# Patient Record
Sex: Female | Born: 1995 | Race: White | Hispanic: Yes | Marital: Married | Smoking: Never smoker
Health system: Western US, Academic
[De-identification: ages and names within clinical notes are randomized; demographics above are authoritative.]

## PROBLEM LIST (undated history)

## (undated) ENCOUNTER — Ambulatory Visit (HOSPITAL_BASED_OUTPATIENT_CLINIC_OR_DEPARTMENT_OTHER): Admission: RE | Payer: Self-pay | Admitting: Gastroenterology

## (undated) ENCOUNTER — Ambulatory Visit (HOSPITAL_BASED_OUTPATIENT_CLINIC_OR_DEPARTMENT_OTHER): Payer: Self-pay | Admitting: Gastroenterology

## (undated) ENCOUNTER — Encounter (HOSPITAL_BASED_OUTPATIENT_CLINIC_OR_DEPARTMENT_OTHER): Payer: Self-pay

## (undated) DIAGNOSIS — G43909 Migraine, unspecified, not intractable, without status migrainosus: Secondary | ICD-10-CM

## (undated) DIAGNOSIS — R000 Tachycardia, unspecified: Secondary | ICD-10-CM

## (undated) DIAGNOSIS — M4050 Lordosis, unspecified, site unspecified: Secondary | ICD-10-CM

## (undated) DIAGNOSIS — F329 Major depressive disorder, single episode, unspecified: Secondary | ICD-10-CM

## (undated) DIAGNOSIS — F32A Depression, unspecified: Secondary | ICD-10-CM

## (undated) HISTORY — DX: Depression, unspecified: F32.A

## (undated) HISTORY — DX: Tachycardia, unspecified: R00.0

## (undated) HISTORY — DX: Major depressive disorder, single episode, unspecified: F32.9

## (undated) HISTORY — DX: Migraine, unspecified, not intractable, without status migrainosus: G43.909

## (undated) HISTORY — DX: Lordosis, unspecified, site unspecified: M40.50

## (undated) SURGERY — ESOPHAGOGASTRODUODENOSCOPY (EGD) USING BRAVO DEVICE
Anesthesia: Monitored Anesthesia Care (MAC)

## (undated) SURGERY — COLONOSCOPY
Anesthesia: Monitored Anesthesia Care (MAC)

## (undated) MED ORDER — NADOLOL 40 MG OR TABS
40.00 mg | ORAL_TABLET | Freq: Every day | ORAL | 0 refills | Status: AC
Start: 2023-02-05 — End: ?

## (undated) MED ORDER — NADOLOL 40 MG OR TABS
40.0000 mg | ORAL_TABLET | Freq: Every day | ORAL | 1 refills | Status: AC
Start: 2023-01-22 — End: ?

## (undated) MED ORDER — NURTEC 75 MG PO TBDP
ORAL_TABLET | ORAL | 5 refills | Status: AC
Start: 2021-09-30 — End: ?

## (undated) MED ORDER — VENLAFAXINE HCL 150 MG OR CP24
150.0000 mg | ORAL_CAPSULE | Freq: Every day | ORAL | 0 refills | Status: AC
Start: 2023-03-19 — End: ?

## (undated) MED ORDER — MOTEGRITY 2 MG PO TABS
ORAL_TABLET | ORAL | 11 refills | Status: AC
Start: 2021-09-26 — End: ?

## (undated) MED ORDER — VERAPAMIL HCL SR (BID) 180 MG OR TBCR
180.0000 mg | EXTENDED_RELEASE_TABLET | Freq: Every day | ORAL | 0 refills | Status: AC
Start: 2023-03-09 — End: ?

## (undated) MED ORDER — METFORMIN HCL 500 MG OR TB24
500.0000 mg | ORAL_TABLET | Freq: Two times a day (BID) | ORAL | 0 refills | Status: AC
Start: 2023-05-16 — End: 2023-06-15

## (undated) MED ORDER — NADOLOL 40 MG OR TABS
40.0000 mg | ORAL_TABLET | Freq: Every day | ORAL | 0 refills | Status: AC
Start: 2023-03-19 — End: ?

## (undated) MED ORDER — VERAPAMIL HCL SR (BID) 180 MG OR TBCR
180.0000 mg | EXTENDED_RELEASE_TABLET | Freq: Every day | ORAL | 1 refills | Status: AC
Start: 2023-01-22 — End: ?

---
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
## 2014-05-08 ENCOUNTER — Encounter (INDEPENDENT_AMBULATORY_CARE_PROVIDER_SITE_OTHER): Payer: Self-pay | Admitting: Otolaryngology

## 2014-05-08 ENCOUNTER — Ambulatory Visit (INDEPENDENT_AMBULATORY_CARE_PROVIDER_SITE_OTHER): Payer: Medicaid Other | Admitting: Otolaryngology

## 2014-05-08 VITALS — BP 121/67 | HR 108 | Temp 97.2°F

## 2014-05-08 DIAGNOSIS — G43109 Migraine with aura, not intractable, without status migrainosus: Principal | ICD-10-CM

## 2014-05-08 MED ORDER — LEVONORGESTREL-ETHINYL ESTRAD 0.1-20 MG-MCG OR TABS
1.00 | ORAL_TABLET | Freq: Every day | ORAL | Status: DC
Start: ? — End: 2018-03-25

## 2014-05-08 MED ORDER — TERBINAFINE HCL EX
CUTANEOUS | Status: DC
Start: ? — End: 2018-03-25

## 2014-05-08 MED ORDER — VERAPAMIL HCL 40 MG OR TABS
40.0000 mg | ORAL_TABLET | Freq: Two times a day (BID) | ORAL | Status: DC
Start: 2014-05-08 — End: 2014-06-02

## 2014-05-08 MED ORDER — MAGNESIUM 500 MG OR CAPS
500.0000 mg | ORAL_CAPSULE | Freq: Every day | ORAL | Status: DC
Start: 2014-05-08 — End: 2015-04-29

## 2014-05-08 NOTE — Progress Notes (Signed)
Headaches still persistent and has not been able to attempt trial of verapamil    Assess:   Migraine with vertigo    Plan:   Verapamil 40 BID   Magnesium 500 mg

## 2014-06-02 ENCOUNTER — Encounter (HOSPITAL_BASED_OUTPATIENT_CLINIC_OR_DEPARTMENT_OTHER): Payer: Self-pay | Admitting: Otolaryngology

## 2014-06-02 DIAGNOSIS — G43109 Migraine with aura, not intractable, without status migrainosus: Principal | ICD-10-CM

## 2014-06-02 MED ORDER — VERAPAMIL HCL SR (BID) 120 MG OR TBCR
120.0000 mg | EXTENDED_RELEASE_TABLET | Freq: Every day | ORAL | Status: DC
Start: 2014-06-02 — End: 2014-11-26

## 2014-06-02 NOTE — Telephone Encounter (Signed)
From: Gloris ManchesterMichelle Alejandra Hemminger  To: Laurier NancyViirre, Wise Fees Scott, MD  Sent: 06/02/2014 12:57 PM PST  Subject: 1-Non Urgent Medical Advice    Hello I just wanted to let you know that the verpaimil is working a little I think I need a higher does for it to fully work I've been getting less headaches.please message me back or call me as soon as possible thank you!

## 2014-06-03 ENCOUNTER — Encounter (HOSPITAL_BASED_OUTPATIENT_CLINIC_OR_DEPARTMENT_OTHER): Payer: Self-pay | Admitting: Otolaryngology

## 2014-06-07 NOTE — Telephone Encounter (Signed)
From: Kristen ManchesterMichelle Alejandra Olson  To: Laurier NancyViirre, Ellizabeth Dacruz Scott, MD  Sent: 06/03/2014 6:02 AM PST  Subject: 1-Non Urgent Medical Advice    so my pharmacy should already have my order? and whats the dosage thanks so much.  ----- Message -----  From: Laurier NancyErik Scott Dashel Goines, MD  Sent: 06/02/2014 9:29 PM PST  To: Kristen ManchesterMichelle Alejandra Oyen  Subject: RE: 1-Non Urgent Medical Advice    Excellent! I have ordered the next step up which is only once a day.    Kristin BruinsErik Akyra Bouchie    ----- Message -----   From: Kristen ManchesterODRIGUEZ,Tracee ALEJANDRA   Sent: 06/02/2014 12:57 PM PST   To: Laurier NancyErik Scott Deepika Decatur, MD  Subject: 1-Non Urgent Medical Advice    Hello I just wanted to let you know that the verpaimil is working a little I think I need a higher does for it to fully work I've been getting less headaches.please message me back or call me as soon as possible thank you!

## 2014-11-26 ENCOUNTER — Other Ambulatory Visit (INDEPENDENT_AMBULATORY_CARE_PROVIDER_SITE_OTHER): Payer: Self-pay | Admitting: Otolaryngology

## 2014-11-26 DIAGNOSIS — G43109 Migraine with aura, not intractable, without status migrainosus: Principal | ICD-10-CM

## 2014-11-26 MED ORDER — VERAPAMIL HCL SR (BID) 120 MG OR TBCR
120.0000 mg | EXTENDED_RELEASE_TABLET | Freq: Every day | ORAL | 2 refills | Status: DC
Start: 2014-11-26 — End: 2015-05-26

## 2014-11-26 NOTE — Telephone Encounter (Signed)
LOV: 05/08/2014  NOV: none  Tabs: 60  Refills: 2

## 2015-04-29 ENCOUNTER — Other Ambulatory Visit (INDEPENDENT_AMBULATORY_CARE_PROVIDER_SITE_OTHER): Payer: Self-pay | Admitting: Otolaryngology

## 2015-04-29 DIAGNOSIS — G43109 Migraine with aura, not intractable, without status migrainosus: Principal | ICD-10-CM

## 2015-04-29 MED ORDER — MAGNESIUM 500 MG OR CAPS
500.0000 mg | ORAL_CAPSULE | Freq: Every day | ORAL | 3 refills | Status: DC
Start: 2015-04-29 — End: 2016-06-05

## 2015-04-29 NOTE — Telephone Encounter (Signed)
Medication:    Magnesium 500 MG capsule     Pharmacy: [id number] - [place], [place] - [address]     Last seen: 05/08/2014  Next Visit: none    Date Last Dispensed: 04/04/2015  By pharmacy     Refill request pending; will be routed to prescribing physician.

## 2015-05-26 ENCOUNTER — Other Ambulatory Visit (INDEPENDENT_AMBULATORY_CARE_PROVIDER_SITE_OTHER): Payer: Self-pay | Admitting: Otolaryngology

## 2015-05-26 DIAGNOSIS — G43109 Migraine with aura, not intractable, without status migrainosus: Principal | ICD-10-CM

## 2015-05-26 NOTE — Telephone Encounter (Signed)
Medication:    verapamil (CALAN SR) 120 MG     Pharmacy: [facility] - [place], [place] - [address]    Last seen: 05/08/2014  Next Visit: none    Date Last Dispensed: 03/23/2015  By pharmacy     Refill request pending; will be routed to prescribing physician.

## 2015-05-28 MED ORDER — VERAPAMIL HCL SR (BID) 120 MG OR TBCR
120.0000 mg | EXTENDED_RELEASE_TABLET | Freq: Every day | ORAL | 2 refills | Status: DC
Start: 2015-05-28 — End: 2018-03-25

## 2016-06-05 ENCOUNTER — Other Ambulatory Visit (INDEPENDENT_AMBULATORY_CARE_PROVIDER_SITE_OTHER): Payer: Self-pay | Admitting: Otolaryngology

## 2016-06-05 DIAGNOSIS — G43109 Migraine with aura, not intractable, without status migrainosus: Principal | ICD-10-CM

## 2016-06-05 MED ORDER — MAGNESIUM 500 MG OR CAPS
500.0000 mg | ORAL_CAPSULE | Freq: Every day | ORAL | 3 refills | Status: DC
Start: 2016-06-05 — End: 2017-06-19

## 2016-06-05 NOTE — Telephone Encounter (Signed)
Medication:    RA MAGNESIUM 500 MG CAPSULE.  Pharmacy:  [facility] [phone]    Last seen: 05/08/2014  Next Visit: N/A    Date Last Dispensed: 03/31/2016  By pharmacy     Refill request pending; will be routed to prescribing physician.

## 2017-06-19 ENCOUNTER — Other Ambulatory Visit (INDEPENDENT_AMBULATORY_CARE_PROVIDER_SITE_OTHER): Payer: Self-pay | Admitting: Otolaryngology

## 2017-06-19 DIAGNOSIS — G43109 Migraine with aura, not intractable, without status migrainosus: Principal | ICD-10-CM

## 2017-06-19 MED ORDER — RA MAGNESIUM 500 MG OR CAPS
ORAL_CAPSULE | ORAL | 3 refills | Status: DC
Start: 2017-06-19 — End: 2018-03-25

## 2017-06-19 NOTE — Telephone Encounter (Signed)
Medication:Magnesium 500 MG capsule (Order# [id number])           [facility]    Last seen:05/08/2014  Next Visit:none        Refill request pending; will be routed to prescribing physician.

## 2017-09-26 HISTORY — PX: CARDIAC DEFIBRILLATOR PLACEMENT: SHX171

## 2018-03-25 ENCOUNTER — Encounter (INDEPENDENT_AMBULATORY_CARE_PROVIDER_SITE_OTHER): Payer: Self-pay | Admitting: Nurse Practitioner

## 2018-03-25 ENCOUNTER — Ambulatory Visit (INDEPENDENT_AMBULATORY_CARE_PROVIDER_SITE_OTHER): Payer: Medicaid Other | Admitting: Nurse Practitioner

## 2018-03-25 VITALS — BP 100/68 | HR 58 | Temp 98.4°F | Ht 61.0 in | Wt 163.0 lb

## 2018-03-25 DIAGNOSIS — Z3041 Encounter for surveillance of contraceptive pills: Secondary | ICD-10-CM

## 2018-03-25 DIAGNOSIS — Z01419 Encounter for gynecological examination (general) (routine) without abnormal findings: Secondary | ICD-10-CM

## 2018-03-25 DIAGNOSIS — N94819 Vulvodynia, unspecified: Secondary | ICD-10-CM

## 2018-03-25 MED ORDER — LEVONORGEST-ETH ESTRAD 91-DAY 0.15-0.03 &0.01 MG OR TABS
1.00 | ORAL_TABLET | Freq: Every day | ORAL | Status: DC
Start: ? — End: 2018-03-25

## 2018-03-25 MED ORDER — TRULANCE 3 MG PO TABS
3.00 mg | ORAL_TABLET | Freq: Every day | ORAL | 0 refills | Status: DC
Start: 2017-12-26 — End: 2021-06-28

## 2018-03-25 MED ORDER — VERAPAMIL HCL SR (BID) 180 MG OR TBCR
EXTENDED_RELEASE_TABLET | ORAL | 0 refills | Status: DC
Start: 2018-02-14 — End: 2022-07-23

## 2018-03-25 MED ORDER — OMEPRAZOLE 20 MG OR CPDR
DELAYED_RELEASE_CAPSULE | ORAL | 0 refills | Status: DC
Start: 2018-02-22 — End: 2023-01-05

## 2018-03-25 MED ORDER — LEVONORGEST-ETH ESTRAD 91-DAY 0.15-0.03 &0.01 MG OR TABS
1.0000 | ORAL_TABLET | Freq: Every day | ORAL | 3 refills | Status: DC
Start: 2018-03-25 — End: 2019-04-08

## 2018-03-25 MED ORDER — ESCITALOPRAM OXALATE 10 MG OR TABS
ORAL_TABLET | ORAL | 0 refills | Status: DC
Start: 2018-03-08 — End: 2022-07-23

## 2018-03-25 MED ORDER — NADOLOL 20 MG OR TABS
ORAL_TABLET | ORAL | 0 refills | Status: DC
Start: 2018-02-14 — End: 2022-07-23

## 2018-03-25 MED ORDER — ISOSORBIDE DINITRATE 30 MG OR TABS: 30.00 mg | ORAL_TABLET | Freq: Three times a day (TID) | ORAL | Status: AC

## 2018-03-25 NOTE — Progress Notes (Signed)
[facility]    SUBJECTIVE  Kristen Olson is a 22 year old female G0P0000 who comes to the clinic for a routine well-woman evaluation.    Satisfied w extended cycle OCP [facility] generic).  Menses q 3 mos on extended cycle OCP.  1 partner x 7 yrs.  Declines STD testing.    Pt reports recent pap neg w/ [facility]/Dr. Andi Hence. 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 Pt reports long h/o painful intercourse and general discomfort of vulva/vagina. She has tried estrogen cream x 3 mos. Uses it w/ intercourse and prn. States that it hasn't helped and "makes her feel weird down there."    Allergies:    Allergies   Allergen Reactions   . Gluten Meal Anaphylaxis       Medications:   Current Outpatient Medications:   .  escitalopram (LEXAPRO) 10 MG tablet, , Disp: , Rfl: 0  .  isosorbide dinitrate (ISORDIL) 30 MG tablet, Take 30 mg by mouth every 8 hours., Disp: , Rfl:   .  levonorgestrel-ethinyl estradiol (SEASONIQUE) 0.15-0.03 &0.01 MG tablet, Take 1 tablet by mouth daily., Disp: 91 tablet, Rfl: 3  .  nadolol (CORGARD) 20 MG tablet, , Disp: , Rfl: 0  .  omeprazole (PRILOSEC) 20 MG capsule, , Disp: , Rfl: 0  .  TRULANCE 3 MG TABS tablet, Take 3 mg by mouth daily., Disp: , Rfl: 0  .  verapamil (CALAN SR) 180 MG Controlled-Release tablet, , Disp: , Rfl: 0    Menstrual history:  LMP approx 12/27/17  History of abnormal vaginal bleeding:  no  Contraception:  oral contraceptives    Gyn History            LMP 12/27/2017 (Within Months), Oral contraception    Age at Menarche     Age at First Pregnancy     Age at Menopause     Gyn History Comments 01/2015 pap NL per pt.No h/o std. BC: OCP.H/o Nexplanon - removed due to wt gain.    Sexual Activity Yes; Female    Contraception Pill    Medical History Tachycardia    Spinal lordosis    Migraine    Depression        Surgical History CARDIAC DEFIBRILLATOR PLACEMENT 09/2017             Family History   Problem Relation Name Age of Onset   . Cholesterol/Lipid  Disorder Mother     . Heart Disease Mother              Review of systems:    A complete review of systems was performed and is negative except as documented in HPI and here: + weight gain, sinus congestion, chest pain, irreg heart beat (sees cardiologist), constipation, migraine, and depression.    OBJECTIVE  BP 100/68 (BP Location: Left arm, BP Patient Position: Sitting, BP cuff size: Regular)   Pulse 58   Temp 98.4 F (36.9 C)   Ht 5\' 1"  (1.549 m)   Wt 73.9 kg (163 lb)   LMP 12/27/2017 (Within Months)   SpO2 98%   Breastfeeding? No   BMI 30.80 kg/m       Physical exam:     HEENT:No thyromegaly, no lymphadenopathy     Cardiovascular:regular rate and rhythm     Chest:clear to auscultation bilaterally     Breast:breasts symmetric, no dominant or suspicious mass, no skin or nipple changes and no axillary adenopathy     Abdomen:negative  Pelvic exam:        EGBUS: Normal female, no lesions        Vagina:Normal mucosa, no discharge        Cervix:No lesions or cervical motion tenderness        Uterus: Normal shape, position and consistency. Exam limited by body habitus and pt tense, guarding.        Adnexa: Not felt. No masses, nodularity, tenderness        RVE: Not done    Laboratory review:  Last pap: 12/2017 NL per pt (w/ [facility]/Dr. Andi Hence)      ASSESSMENT/PLAN  Kristen Olson was seen today for annual exam.    Diagnoses and all orders for this visit:    Well woman exam with routine gynecological exam    Surveillance for birth control, oral contraceptives  -     levonorgestrel-ethinyl estradiol (SEASONIQUE) 0.15-0.03 &0.01 MG tablet; Take 1 tablet by mouth daily.    Vulvodynia  -     [facility]    Advised pt to try using estrogen vaginal cream (Rx from Dr. Andi Hence) 2 times per week instead of just as needed.    Defer Pap. Next due 2022.  Pt declines std testing.

## 2018-07-22 ENCOUNTER — Telehealth (INDEPENDENT_AMBULATORY_CARE_PROVIDER_SITE_OTHER): Payer: Self-pay

## 2018-07-22 NOTE — Telephone Encounter (Signed)
Pt change insurance and the insurance no longer covers escitalopram (LEXAPRO) 10 MG tablet. Wants to know if we can send a auth. WWE 02/2018.  [facility]    [address], [place], [place] [address]  No known allergies per pt.

## 2018-07-23 NOTE — Telephone Encounter (Addendum)
TC from pt: request was for OCP, not lexapro. Pharmacy told her order was "accidentally DC'd" and then told pt that also not covered under new insurance. Pt is already past due to start new pack. Pt is frustrated and confused. Ok to leave detailed message if needed.    TC to pharmacy: spoke with [facility]. Accidental DC of Rx was already corrected. Only issue is that new insurance requires PA. To initiate and fax for RN to complete. Pt may want to consider changing BC    TC to pt: notified that PA will be completed once received. Pt does not want to trial another BC, states she failed every other OCP tried, has heavy periods and severe cramping on every OCP she tried except seasonique.

## 2018-07-26 NOTE — Telephone Encounter (Signed)
PA received from pharmacy. Completed by RN and faxed with chart notes to [facility].

## 2018-07-30 NOTE — Telephone Encounter (Signed)
Additional form rcv'd from pharmacy. Completed, faxed to [facility]. Given to [place], admin staff, to scan into media.

## 2018-09-25 ENCOUNTER — Other Ambulatory Visit (INDEPENDENT_AMBULATORY_CARE_PROVIDER_SITE_OTHER): Payer: Self-pay | Admitting: Otolaryngology

## 2018-09-25 DIAGNOSIS — G43109 Migraine with aura, not intractable, without status migrainosus: Principal | ICD-10-CM

## 2018-09-25 NOTE — Telephone Encounter (Signed)
Prescription Refill Request     Incoming fax refill request from pharmacy     Prescribing provider: Dr. Wynona Luna    Requested Medication/s: Magnesium 500MG  Cap      Send to:   [facility] [place], [place] - [address]  [address]  [address]  [place] [address]  Phone: [phone] Fax: [phone]

## 2018-09-26 MED ORDER — MAGNESIUM OXIDE 500 MG OR CAPS
ORAL_CAPSULE | ORAL | 11 refills | Status: DC
Start: 2018-09-26 — End: 2019-10-07

## 2019-04-08 ENCOUNTER — Other Ambulatory Visit (INDEPENDENT_AMBULATORY_CARE_PROVIDER_SITE_OTHER): Payer: Self-pay | Admitting: Nurse Practitioner

## 2019-04-08 DIAGNOSIS — Z3041 Encounter for surveillance of contraceptive pills: Secondary | ICD-10-CM

## 2019-04-08 MED ORDER — LEVONORGEST-ETH ESTRAD 91-DAY 0.15-0.03 &0.01 MG OR TABS
1.00 | ORAL_TABLET | Freq: Every day | ORAL | 0 refills | Status: DC
Start: 2019-04-08 — End: 2019-07-18

## 2019-04-08 NOTE — Telephone Encounter (Signed)
Refill request [facility]   Due for [facility] 02/2019: Needs appointment: please call patient

## 2019-04-08 NOTE — Telephone Encounter (Signed)
OCP refill ordered.

## 2019-04-10 NOTE — Telephone Encounter (Signed)
Pt scheduled 05/07/19

## 2019-05-07 ENCOUNTER — Ambulatory Visit (INDEPENDENT_AMBULATORY_CARE_PROVIDER_SITE_OTHER): Payer: Medicaid Other | Admitting: Nurse Practitioner

## 2019-05-07 ENCOUNTER — Telehealth (HOSPITAL_BASED_OUTPATIENT_CLINIC_OR_DEPARTMENT_OTHER): Payer: Self-pay

## 2019-05-07 NOTE — Telephone Encounter (Signed)
Patient is scheduled for in-clinic appointment.    Appointment scheduled for     Do you have or have you had in the last 24 hours:   ·         Fever:  N  ·         New cough (not chronic) : N  ·         Shortness of breath: N  *Refer patient to PCP if YES for any flu like symptoms           2. Have you knowingly been exposed to anyone having any, some or all of the symptoms listed above?  N     3. Have you traveled  outside of the [place] in the last 14 days? .  N     If Yes to any of these, schedule and or reschedule existing appointment 14 + days out     4.    Are you experiencing any new urgent Neurological symptoms?   If Yes to # 4, provide symptoms below using .sccsymptoms smart phrase and route to site specific triage.    If no to # 4, document encounter and close.  Do not route.    Did you inform patient of no visitor policy Y    Will the patient require a visitor? N    If Yes: document visitor's full name in appointment notes.

## 2019-06-12 ENCOUNTER — Encounter: Payer: Self-pay | Admitting: Hospital

## 2019-07-18 ENCOUNTER — Other Ambulatory Visit (INDEPENDENT_AMBULATORY_CARE_PROVIDER_SITE_OTHER): Payer: Self-pay | Admitting: Nurse Practitioner

## 2019-07-18 DIAGNOSIS — Z3041 Encounter for surveillance of contraceptive pills: Secondary | ICD-10-CM

## 2019-07-18 MED ORDER — LEVONORGEST-ETH ESTRAD 91-DAY 0.15-0.03 &0.01 MG OR TABS
1.0000 | ORAL_TABLET | Freq: Every day | ORAL | 0 refills | Status: DC
Start: 2019-07-18 — End: 2021-09-02

## 2019-07-18 NOTE — Telephone Encounter (Signed)
*  received fax from pharmacy requesting refill     Medication name and dose: levonorgestrel-ethinyl estradiol (SEASONIQUE) 0.15-0.03 &0.01 MG tablet     Provider who gave the medication.: Ordering: Leslie Andrea, NP    Date last seen by provider: 03/25/2018    Date of next appointment: None scheduled     Name of pharmacy and pharmacy phone number.  [facility] - [place], [place] - [address]  Phone: [phone]

## 2019-07-18 NOTE — Telephone Encounter (Signed)
OCP refill ordered.

## 2019-07-21 NOTE — Telephone Encounter (Signed)
Left voice mail to schedule appt

## 2019-08-27 ENCOUNTER — Encounter (INDEPENDENT_AMBULATORY_CARE_PROVIDER_SITE_OTHER): Payer: Self-pay

## 2019-09-06 ENCOUNTER — Ambulatory Visit (INDEPENDENT_AMBULATORY_CARE_PROVIDER_SITE_OTHER): Payer: Medicaid Other

## 2019-09-06 DIAGNOSIS — Z23 Encounter for immunization: Secondary | ICD-10-CM

## 2019-09-06 MED ORDER — COVID-19 MRNA VACC (MODERNA) 100 MCG/0.5ML IM SUSP (ROVER)
0.5000 mL | Freq: Once | INTRAMUSCULAR | Status: AC
Start: 2019-09-06 — End: 2019-09-06
  Administered 2019-09-06 (×2): 0.5 mL via INTRAMUSCULAR

## 2019-10-04 ENCOUNTER — Ambulatory Visit (INDEPENDENT_AMBULATORY_CARE_PROVIDER_SITE_OTHER): Payer: Self-pay

## 2019-10-04 ENCOUNTER — Ambulatory Visit (INDEPENDENT_AMBULATORY_CARE_PROVIDER_SITE_OTHER): Payer: Medicaid Other

## 2019-10-04 DIAGNOSIS — Z23 Encounter for immunization: Secondary | ICD-10-CM

## 2019-10-04 MED ORDER — COVID-19 MRNA VACC (MODERNA) 100 MCG/0.5ML IM SUSP (ROVER)
0.5000 mL | Freq: Once | INTRAMUSCULAR | Status: AC
Start: 2019-10-04 — End: 2019-10-04
  Administered 2019-10-04 (×2): 0.5 mL via INTRAMUSCULAR

## 2019-10-06 ENCOUNTER — Other Ambulatory Visit (INDEPENDENT_AMBULATORY_CARE_PROVIDER_SITE_OTHER): Payer: Self-pay | Admitting: Otolaryngology

## 2019-10-06 DIAGNOSIS — G43109 Migraine with aura, not intractable, without status migrainosus: Secondary | ICD-10-CM

## 2019-10-07 ENCOUNTER — Telehealth (INDEPENDENT_AMBULATORY_CARE_PROVIDER_SITE_OTHER): Payer: Self-pay | Admitting: Nurse Practitioner

## 2019-10-07 MED ORDER — MAGNESIUM OXIDE 500 MG OR CAPS
ORAL_CAPSULE | ORAL | 11 refills | Status: DC
Start: 2019-10-07 — End: 2020-09-13

## 2019-10-07 NOTE — Telephone Encounter (Signed)
Patient is requesting refill of medication, please see orders for preloaded prescription refill of  Magnesium Oxide 500 MG CAPS .    Last visit with this physician: 05/08/2014     Next appointment with this physician: None     Request has been routed to provider : Dr. Wynona Luna

## 2019-10-07 NOTE — Telephone Encounter (Signed)
Provider Name:NP Riego    Last Visit Date: 03/25/2018    Next office visit date: NA    Medications:levonorgestrel-ethinyl estradiol (SEASONIQUE) 0.15-0.03 &0.01 MG tablet        Name of Pharmacy Updated in Demographics (YES)          [facility] - [place], [place] - [address]  [address]  [place] [address]  Phone: [phone] Fax: [phone]

## 2019-10-08 NOTE — Telephone Encounter (Signed)
Pt needs referral from PCP office. Pt woring on obtaining that.

## 2020-09-13 ENCOUNTER — Other Ambulatory Visit (INDEPENDENT_AMBULATORY_CARE_PROVIDER_SITE_OTHER): Payer: Self-pay | Admitting: Neurology

## 2020-09-13 DIAGNOSIS — G43109 Migraine with aura, not intractable, without status migrainosus: Secondary | ICD-10-CM

## 2020-09-13 MED ORDER — MAGNESIUM OXIDE 500 MG OR CAPS
ORAL_CAPSULE | ORAL | 11 refills | Status: DC
Start: 2020-09-13 — End: 2021-10-07

## 2020-09-13 NOTE — Telephone Encounter (Signed)
Patient is requesting refill of medication, please see orders for preloaded prescription refill of   Magnesium Oxide 500 MG CAPS  Last visit with this physician:   05/08/14  Next appointment with this physician:   NONE  Request has been routed to provider  DR. Wynona Luna

## 2020-10-12 ENCOUNTER — Telehealth: Admitting: Neurology

## 2020-10-12 MED ORDER — RIZATRIPTAN BENZOATE 10 MG OR TABS
10.0000 mg | ORAL_TABLET | Freq: Once | ORAL | 11 refills | Status: DC | PRN
Start: 2020-10-12 — End: 2021-01-26

## 2020-10-12 NOTE — Patient Instructions (Signed)
BOTOX  FOR MIGRAINE    Migraine can be subdivided into chronic and episodic types depending on the number of headache days each month.  If there are more than 15 headache days a month with at least 8 moderate to severe headache days this is termed Chronic Migraine.  Simply stated, this diagnosis refers to individuals who have migraines and overall headache more days than not. If there are less than 15 headache days a month this is termed Episodic Migraine. BOTOX is FDA approved only for Chronic Migraine. This medication was originally used to treat crossed eyes and later for conditions such as blepharospasm (eye muscle twitching and spasms) and torticollis (twisting necks).     Headaches are extremely common in the [place] with an estimate of 45 million people affected such that they miss days from work and school or have chronic disability.    BOTOX (OnaBotulinum Toxin) for Headache    BOTOX is a therapeutic option for the preventive treatment of migraine. It offers several advantages over current drug therapy. Injections are given in doses up to 195 units every 12 weeks. When effective, the need for daily medications or acute medicines for severe attacks can be reduced. Over 10,000 patient injections have been performed between Drs. Blumenfeld and Arling Cerone in more than 3,000 patients with approximately 70% of patients having a good response and continuing sequential treatments.      Mechanism of Action    BOTOX can reduce muscle spasms. However, the reduction in pain is significant and can last longer. It is likely that small fibers containing pain-producing chemicals such as Substance P, glutamate and CGRP are affected by Botulinum toxins. Other mechanisms, not yet understood, may be at play. None of these theories are yet proven.1        History    Botox was 1st developed in the 1980s for treatment of facial spasms, including blepharospasm. In the early 1990's, Dr. Fuller Plan, an Otolaryngologist who  used Botulinum toxin for cosmetic purposes in the muscles around the forehead noted that a number of patients reported improvements in migraine headache. The number of physicians using BOTOX for headache has gradually increased. Dr. Estill Batten worked closely with Dr. Lajoyce Corners to develop the current injection technique for Chronic Migraine.1 Drs. Blumenfeld and Yittel Emrich also work closely with [employer], the company that manufactures BOTOX, in studies of onobotulinumtoxnA in headache disorders.2-6 Both Doctors are active in training other physicians around the world to inject onobotulinumtoxin A for Chronic Migraine.      Types of Injections    There are usually a total of 31+ injections over 7 muscle regions.  Muscles around the eyes and forehead are injected with small amounts of Botulinum toxin.  Injections are also made into the temporal region, into the upper back of the neck and going down onto the shoulders.  Dots indicate typical injection locations.      The usual injection sites and doses are:   Procerus 5 units  Corrugators 5 units each  Frontalis 10 units each  Temporalis 20 units each  Occipitalis 15 units each  Trapezuis 15 units each  Cervical paraspinals 10 units each    Timing of Injection and Duration of Effect    After the patient receives a Botulinum toxin injection, there may be an immediate effect just from the use of needles into muscles. In a [place] study, it was found that dry needle injections into muscles such as trigger-point areas in fibromyalgia significantly reduced pain for up to  a week's time. This may be the basis of the initial effect sometimes seen in migraine patients. Usually patients do not begin to experience relief from their headache for two to four weeks. The beneficial effect gradually increases and has a duration of three months.  When a second injection is performed, the effect appears to take hold sooner, last longer, and have a greater benefit.      Efficacy    The  PREEMPT Trial was designed to assess and prove the efficiency and safety of BOTOX.   Subjects were randomized and subjected to either BOTOX injections or placebo injections. The study compared the baseline number of headache days per 28 days prior to the injections to the number of headaches in weeks 20-24 post-treatment. The baseline frequency of headaches in this study was 19-20 days per 28 days. The frequency of headache days per 28 days decreased by 8-9 days relative to baseline in subjects who received BOTOX. Overall, the study demonstrated the effectiveness of BOTOX in the treatment of headaches in patients with chronic migraine, and that repeated treatments were safe and well tolerated.      BOTOX  Side Effects    Most patients undergoing BOTOX experience a much lower degree of side effects than those that they experience from their current oral medications. Furthermore, because BOTOX is a local injection, the side effects are typically minimal and localized, in comparison to the systemic side effects that can result from the oral medications. On occasion people have pain, soreness, swelling, redness, local weakness or droopiness of the eyelid. About 5% of patients may develop a temporary headache, and 9% may develop temporary neck pain after injections.7 However, most patients tend to not have any adverse reactions to the treatment.       Botox Eligibility    BOTOX is the only FDA approved preventive for chronic migraine. However, it is not usually covered as a 1st line treatment by insurance. Eligibility criteria for BOTOX vary. However, the minimal requirements are the following: patients have been diagnosed with Chronic Migraine, consisting of over 15 headaches per month, each lasting four hours or longer untreated. They have tried and failed 2-3 preventive medications such as anti-depressant, anti-seizure, and anti-hypertensive medications. The provider will select which medications are most appropriate  for the patient, as well as take into account any contraindications or allergies.    Summary    BOTOX has been used in [facility] for decades. Drs. Blumenfeld and Cherisa Brucker have been Multimedia programmer in this field of medicine. It is quite effective in many patients who are not responding to standard migraine preventive therapy. Our estimate of effect in this population is 70%. In many instances this is a less expensive alternative than chronic daily therapy with frequent use of triptan medication. Nonetheless, BOTOX does not work in every patient and alternative therapies must be kept in mind.  ~~~~~~~~~~~~    CGRP and Migraine    Researchers continue to uncover intriguing details about migraine. There are a number of brain chemicals that have been investigated in the complex mechanism of migraine, some of which include calcitonin-gene-related peptide (CGRP), oxytocin, serotonin, substance P, and vasoactive intestinal peptide.  These are small peptides that act as a neurotransmitter (chemical messenger) in the brain.    A body of migraine research over the last few decades suggests that the release of CGRP from the trigeminal nerves in the brain may play a key role in the chemical basis for migraine attacks. Understanding the  role of CGRP in migraine can help have a better understanding of the disease. Research linking CGRP and migraine shows:    1) CGRP levels have been shown to be elevated during a migraine attack and return to normal as pain resolves.  2) CGRP is released after nerve activation  3) Production of CGRP contributes to inflammation and blood vessel dilation in the brain  4) CGRP is involved in the transmission of pain signals  5) Injection of CGRP can induce a migraine attack in those who are susceptible to migraine headaches  6) Medications that block CGRP can relieve or prevent migraines.    People with migraine new options for treatment now, and these new medications aren't just about treating the  pain after it's begun - they prevent migraines from getting started. While there are other medications used to prevent migraines, the currently available medications were all originally developed for other uses, and can have significant side effects.    The new treatment options target CGRP. Researchers have created monoclonal antibodies that target and block either CGRP or the receptors it binds to. Blocking the receptor is analogous to putting chewing gum into a lock - the key does not fit. Blocking CGRP is like putting gum on the key - it still does not fit.    Several pharmaceutical companies have developed drugs that block the effects of CGRP. Three of these drugs  - Aimovig, Emgality, Ajovy are injected subcutaneously (under the skin) and the other - Vyepti, is injected intravenously (into a vein).    In studies, all of these medications have been beneficial as migraine preventives. They start working quickly, and have been well tolerated. As antibody medications stay in the body for a month or more, people can treat once a month or even less often, as migraine prevention. They should not be used in women who are going to get pregnant in the next 6 months as we have no safety data during pregnancy or breast feeding.  In general, they are very well tolerated.    There are also oral medications that block the CGRP receptor, used for acute (abortive, rescue) treatment - rimegepant, ubrogepant.      Scan this code with your smart phone for more information!     ~~~~~~~~  Things You Can Do without a Prescription to help your Migraine Headaches      Food triggers -Most important trigger is any food/drink, which contains artificial  sweeteners, like DIET drinks.  MSG (monosodium glutamate - often in [place] food and salad dressing), aspartame (NutraSweet), red wine, processed foods containing nitrites, are common migraine triggers. Although these are the most common triggers, but each person may find individual  trigger food. So basically, if any food /drink gives you a headache, don't eat/drink it!  Eat regular, small, healthy meals.    Get regular light exercise - studies show that regular cardiovascular exercise (5 days a week, 30 minutes a day) does an excellent job at preventing headaches sometimes even better than medications. Avoid overexertion & start gradually - even a daily walk helps.  Work on daily relaxation, deep breathing & good posture also. Yoga and meditation also have been shown to be very effective.     Drink plenty of fluids - Best fluid is plain water, avoid flavored water. About one-half to one gallon of water a day - this helps prevent headaches, and helps back and joint pain as well.  If you have a headache, drinking a big  glass of water can help it get better.     Get regular sleep - sleep deprivation can contribute to chronic headaches. On the other hand oversleeping can trigger the headache. Patient with chronic headache should have regular sleep schedule If you snore, talk to your doctor - Obstructive Sleep Apnea is a common cause of daily headaches.    Caffeine -Although small amount of caffeine won't make the headache worse and even caffeine may help a headache if used infrequently, but studies show that regular caffeine use results in an increased number of headaches for many people. In general excessive use or fluctuation in caffeine intake is a clear triggering factor for headache, this includes coffee, tea, caffeinated pop. Don't forget caffeine containing medicines like Excedrin or Fioricet has A LOT of caffeine.    Smoking- Smoking is a common headache trigger & cigarette smoke contains  chemicals that can make pain worse.  Besides headaches, smoking causes cancer, strokes & heart disease.  It makes arthritis and low back pain worse.    Smell- Strong perfumes / chemical smells can trigger migraines - beware of chemically scented air fresheners and candles.    Don't overuse your pain  medicines - if you are using ANY "over-the-counter" pain medicine more than 3 days in a row or 15 days a month, or any prescription medication more than 9 days per month, there is a chance it could be causing rebound headaches. This is particularly true for narcotic drugs (like Tramadol, Vicodin, Percocet , Darvocet, Oxycontin,.)and barbiturate-caffeine containing medication (Fioricet, Fiorinal, Esgic,.).   ~~~~~~~    Headache Prevention With Complementary and Alternative Medicine    In patient-centered approaches the best of conventional medicine is used with complementary and alternative medicine (CAM) to combine the best of both for patient benefit. CAM methods, used in this way, are becoming more commonplace in headache management. CAM may be indicated if you simply prefer it, have low tolerance of or poor response to conventional drugs, or have a medical contraindication to certain drugs.     If you use herbal therapy, you are one of the 20% of Americans who do so for a health condition and/or health promotion. But more than half do not disclose their use. However, reporting is necessary, as 72% of those using medicinal herbs also use prescription drugs, and 84% also use over-the-counter medications. If you use herbal preparations, you are more likely to be between 20 and 62 years of age, uninsured or underinsured, female, have higher education levels, live in the [place] or self-identify as "non-Hispanic, other." Roughly two-thirds of Americans using medicinal herbs do not do so for proven uses.     There are 5 commonly utilized CAM medications for headache prevention. While considering preventive CAM therapy realize that this should be just a component of your overall program aimed at reducing trigger factors, implementing behavioral headache management and taking medications acceptable to you and your practitioner. The goal is to find effective means to reduce, by at least 50%, the number of headaches, their  intensity or their length, while improving function and quality of life.   The American Migraine Prevalence and Prevention study suggests considering drug prevention with only 4 days per month of headache and no functional limitations or for 2 days per month with severe impairment. While nearly 40% of headache sufferers meet criteria for prevention just over 10% receive any.     If prevention is warranted, discuss your motivations, preferences, and concerns with your providers. Be  aware that CAM headache therapies are not as rigorously proven as many conventional preventatives and not as strictly regulated by the Food and Drug Administration of the [place] as are prescription therapies and devices. They are classified as dietary supplements and not drugs; and therefore no proof of safe manufacturing, effectiveness, or safety is required for marketing in the [place]. You should have concern for the lack of industry standardization as regards the contents and purity of herbals, along with batch-to-batch consistency. Finally, one should recall that the total number of subjects involved in studies evaluating "herbals" for headache treatment typically is very small. Thus, the admonition: "consumers beware."     FEVERFEW  Feverfew (Tanacetum parthenium) is a species in the chrysanthemum family, whose dried leaves have long been used as a headache remedy. Available feverfew preparations have a >400% variation in dosage strength of the active ingredient parthenolide. Because feverfew also contains melatonin and other compounds, uncertainty exists regarding the major active ingredient. Currently, there are no commercially available standardized feverfew products; therefore, recommendations about dosing are not possible. Feverfew adverse events include sore mouth and tongue (including oral ulcers), swollen lips, loss of taste, abdominal pain, and gastrointestinal disturbances. Adequate long-term studies are available on some  preparations.     PETADOLEX   Petasites hybridus, known as butterbur, is a perennial shrub which grows wild on [place]. The butterbur root is supposed to have antimigraine properties. Some plant parts may cause cancer, liver, lung, and bleeding disorders. The [employer] (Commission E) certifies brand name Petadolex as nontoxic. Two adequate trials support effectiveness with 150 mg per day but not 100 mg. Adverse events compare favorably with placebo except for excess burping. Long-term safety data are limited. The average duration of use has been approximately 3 months with 450,000 users assessed short term with >75,000 patient years of exposure as of 2003.     MAGNESIUM   Low brain magnesium levels in migraineurs are reported in several studies. Magnesium prevention trials have yielded mixed results. It appears that prolonged "high dose" supplementation of a minimum of 400-600 mg per day may be required to achieve any benefit from therapy. No comparison of different magnesium formulations exists to inform decisions. Adverse events are mainly gastrointestinal (diarrhea predominating). Amino acid chelates may be better tolerated, but are lower strength and therefore cost is slightly higher. There is no evidence of any short- or long-term safety issues if serious kidney disease is absent. 967     RIBOFLAVIN   Riboflavin is a water-soluble essential vitamin (B2). There is no proof of best dosage or definite proof of efficacy. Doses of 25 mg and 400 mg have had benefit. Adverse events are limited to diarrhea and frequent urination of fluorescent yellow urine. No known long-term toxicity is known.     COENZYMEQ10   CoQ10, like riboflavin, plays a role in cell energy production. The preparation used in the only rigorous study is not commercially available. The dose of 300 mg was divided into 100 mg 3 times per day. Adverse events were not different between groups. CoQ10 is well-tolerated and  long-term safety is acceptable.     MELATONIN   There are many reasons melatonin should be beneficial in headache, but no proof exists presently. It has been shown to be useful for insomnia.     VITAMIN D3   While not evidence based, vitamin D deficiency/insufficiency is common and harmful. Consider optimizing to a blood level of between 50-80, which may require 1000 IU per  every 25-30 pounds. Such dosing requires a 67-month blood level for 25(OH)D and calcium. Visit [email or web address] and discuss with your practitioner.     OTHER CAM THERAPIES   In a survey of CAM organizations aromatherapy, Bowen technique, chiropractic, hypnotherapy, massage, nutrition, reflexology, Reiki, and yoga were all recommended for headache. Little to no proof of benefit exists on these approaches. Fish oil research is currently negative. 5-HTP may not affect headache. Phytoestrogens may be promising.     SUMMARY   As none of the herbal treatments above have been tested in pregnancy, it is best for pregnant women to avoid all but magnesium. No firm consensus exists as to the relative effectiveness CAM agents, but given the number of patients studied and data available, I tentatively suggest the rank order may be: Petadolex _ Magnesium > Feverfew (if MIG-99 forms become commercially available) > Riboflavin > CoenzymeQ10 >> Melatonin.     Norris Cross. Ladona Ridgel, MD Adjunct Professor of Neurology, [facility] Director, [facility] and [place], [place], [place], [place]

## 2020-10-12 NOTE — Progress Notes (Signed)
 Patient Verification & Telemedicine Consent:    I am proceeding with this evaluation at the direct request of the patient.  I have verified this is the correct patient and have obtained verbal consent and written consent from the patient/ surrogate to perform this voluntary telemedicine evaluation (including obtaining history, performing examination and reviewing data provided by the patient).   The patient/surrogate has the right to refuse this evaluation.  I have explained risks (including potential loss of confidentiality), benefits, alternatives, and the potential need for subsequent face to face care.     Patient/surrogate understands that there is a risk of medical inaccuracies given that our recommendations will be made based on reported data (and we must therefore assume this information is accurate).  Knowing that there is a risk that this information is not reported accurately, and that the telemedicine video, audio, or data feed may be incomplete, the patient agrees to proceed with evaluation and holds [place] harmless knowing these risks.     All laws concerning confidentiality and patient access to medical records and copies of medical records apply to telemedicine.      The patient/surrogate has received [facility] Notice of Privacy Practices.  I have reviewed this above verification and consent paragraph with the patient/surrogate.  If the patient is not capacitated to understand the above, and no surrogate is available, since this is not an emergency evaluation, the visit will be rescheduled until such time that the patient can consent, or the surrogate is available to consent.     Intra-State Location: The patient/ surrogate attests to understanding that if the patient accesses these services from a location outside of [place], that the patient does so at the patient's own risk and initiative and that the patient is ultimately responsible for compliance with any laws or regulations  associated with the patient's use.    Demographics:  Medical Record #: [id number]   Date: Oct 12, 2020   Patient Name: Kristen Olson   DOB: Nov 29, 1995  Age: 25 year old  Sex: female  Location: Home address on file      Evaluator(s):   Kristen Olson was evaluated by me today.    Clinic Location:  [facility] [place]  [facility] [place]  [address], [address]  [place] [address]    HEADACHE MEDICINE SPECIALTY CONSULTATION    REFERRED BY:  Dr Juel Burrow     CHIEF COMPLAINT:   Chief Complaint   Patient presents with   . Migraine       HISTORY:  This 25 year old female is seen in Neurology consultation on 10/12/20    [place].  This is a initial visit for this patient with chief complaint of "chronic complex migraines".  She states that she has had episodes of temporary facial and leg paralysis and dizziness as well as partial visual impairment, dating back to middle school.  She takes medications as needed, but "still gets them".  Initially they were only occasional but at this point she estimates at least 10 moderate to severe days of headache per month, lasting 3-5 hours at a time.  She acknowledges other days of milder headache and reports that she is completely headache free at best 10 or 15 days per month.    Pain is described as bilateral, a bit more on the right, and primarily posterior at least initially.  She notes loss of vision and dizziness, numbness and tingling, nausea, and throbbing headache which worsens  with activity.  Triggers have included stress, fatigue, poor sleep, and MSG.  She has occasional nighttime headaches, and also notes history of headache problems in both her parents.  Years ago she had MRI and spinal tap, but has not had any recent testing.  She has been using verapamil both for arrhythmia and for headaches, and she has previously used Effexor, Prozac, Elavil, Topamax, and Neurontin.  On an abortive basis she has used several nonsteroidals as  well as Imitrex and Zomig, and occasionally, opiates.  She's also tried acupuncture, and magnesium.  She has a Clinical biochemist job and reports that hearing the voices of her customers, with the brightness of the screen, is very challenging when she's having a migraine attack.  Triggers have included stress, poor sleep.  She's also recognized that MSG, and eating octopus can trigger her migraines.  When she was on Topamax, she had excessive weight loss.  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  She is presently taking Lexapro, verapamil, and Corgard.  Of these, only verapamil has helped her headaches, and as noted above, has been on many other medications for this.  Because of her ongoing migraine issues, she presents now for consultation.      REVIEW OF SYSTEMS:  Review of Systems - scanned to chart    PAST MEDICAL/SURGICAL HISTORY:   Past Medical History:   Diagnosis Date   . Depression    . Migraine    . Spinal lordosis    . Tachycardia     Hx Tachycardia, coronary artery spasms - sees cardiologist, on meds. Defibrillator placed 09/2017.     Past Surgical History:   Procedure Laterality Date   . CARDIAC DEFIBRILLATOR PLACEMENT  09/2017       MEDICATIONS:    Current Outpatient Medications:   .  escitalopram (LEXAPRO) 10 MG tablet, , Disp: , Rfl: 0  .  isosorbide dinitrate (ISORDIL) 30 MG tablet, Take 30 mg by mouth every 8 hours., Disp: , Rfl:   .  levonorgestrel-ethinyl estradiol (SEASONIQUE) 0.15-0.03 &0.01 MG tablet, Take 1 tablet by mouth daily., Disp: 91 tablet, Rfl: 0  .  Magnesium Oxide 500 MG CAPS, TAKE 1 CAPSULE BY MOUTH EVERY DAY, Disp: 30 capsule, Rfl: 11  .  nadolol (CORGARD) 20 MG tablet, , Disp: , Rfl: 0  .  omeprazole (PRILOSEC) 20 MG capsule, , Disp: , Rfl: 0  .  rizatriptan (MAXALT) 10 MG tablet, Take 1 tablet (10 mg) by mouth once as needed for Migraine for up to 1 dose. May repeat in 2 hours in needed, Disp: 10 tablet, Rfl: 11  .  TRULANCE 3 MG TABS tablet, Take 3 mg by  mouth daily., Disp: , Rfl: 0  .  verapamil (CALAN SR) 180 MG Controlled-Release tablet, , Disp: , Rfl: 0    ALLERGIES:   Gluten meal    SOCIAL HISTORY:   Social History     Socioeconomic History   . Marital status: Married     Spouse name: Not on file   . Number of children: Not on file   . Years of education: Not on file   . Highest education level: Not on file   Occupational History   . Not on file   Tobacco Use   . Smoking status: Never Smoker   . Smokeless tobacco: Never Used   Substance and Sexual Activity   . Alcohol use: Not Currently   . Drug use: Never   .  Sexual activity: Yes     Partners: Male     Birth control/protection: Pill   Other Topics Concern   . Caffeine Concern Not Asked   Social History Narrative   . Not on file     Social Determinants of Health     Financial Resource Strain: Not on file   Food Insecurity: Not on file   Transportation Needs: Not on file   Physical Activity: Not on file   Stress: Not on file   Social Connections: Not on file   Intimate Partner Violence: Not on file   Housing Stability: Not on file       FAMILY HISTORY:   Family History   Problem Relation Name Age of Onset   . Cholesterol/Lipid Disorder Mother     . Heart Disease Mother     .     PHYSICAL EXAMINATION:    Neurologic Exam    VITALS: not currently breastfeeding.    HEAD AND NECK:    Neck range of motion appears full.  Marland Kitchen    NEUROLOGICAL EXAMINATION:    Speech is fluent.  Comprehension is intact.  There is no evidence of right left confusion or hemi-neglect.      Pupils are 4 mm and round.  Extraocular movements appear normal.  Face is symmetric at rest and with movement.  Lack of sensation on touching the divisions of the trigeminal is denied.  Hearing appears intact to voice.     There is no drift of the outstretched hands.      There is no ataxia finger to nose.     IMPRESSION and PLAN:   This patient has a history of migraine with aura, including weakness, visual disturbance.  Based on her report of headache  frequency, she fits criteria for chronic migraine with some degree of headache more than half of the days of the month.  She has been refractory to a wide variety of methods of different categories so we have talked about other treatment options.    I've described to her some of the anatomy and physiology of migraine, as relevant.  For prevention therapy, we have discussed Botox, and the CGRP targeting antibody medicines.  I'm less inclined to start her with treatment to is that is daily and she already has a significant number of daily medicines.  I've given her quite a bit of information about these, asked her to research them further, and to get back to me regarding what direction she would like to go.    For acute treatment, we've agreed upon a trial of Maxalt.  Should this not be successful, I will try her with Nurtec or Ubrelvy.  I've recommended that she maintain a headache calendar, and arrange a revisit for about a month or so from now.                  Thane Edu, M.D.  Diplomate, Biomedical engineer of Psychiatry and Neurology.  Certificate in Headache Medicine.  Fellow [employer] Head[employer].    Electronically signed by: Lessie Dings, MD   Signature Derived From Controlled Access Password, Oct 12, 2020, 2:38 PM    Voice transcription software (Dragon speak) has been utilized for parts of this note. The readers should be aware of that, as typographical or word recognition errors are possible with voice transcription software. Please contact the dictating provider if there are questions.

## 2020-10-13 ENCOUNTER — Telehealth (INDEPENDENT_AMBULATORY_CARE_PROVIDER_SITE_OTHER): Payer: Self-pay | Admitting: Neurology

## 2020-10-13 NOTE — Telephone Encounter (Signed)
 Lmom and sent pocketdoc for patient to call back and schedule a follow up with Jeanelle Malling, PA, per Dr. Shelly Bombard, in 4 weeks.    //ap

## 2020-11-16 ENCOUNTER — Encounter (INDEPENDENT_AMBULATORY_CARE_PROVIDER_SITE_OTHER): Payer: Self-pay | Admitting: Neurology

## 2020-11-16 DIAGNOSIS — G43709 Chronic migraine without aura, not intractable, without status migrainosus: Secondary | ICD-10-CM

## 2020-11-17 ENCOUNTER — Encounter (INDEPENDENT_AMBULATORY_CARE_PROVIDER_SITE_OTHER): Payer: Self-pay | Admitting: Neurology

## 2020-11-17 DIAGNOSIS — G43709 Chronic migraine without aura, not intractable, without status migrainosus: Secondary | ICD-10-CM

## 2020-11-17 MED ORDER — GALCANEZUMAB-GNLM 120 MG/ML SC SOAJ
120.0000 mg | SUBCUTANEOUS | 11 refills | Status: DC
Start: 2020-11-17 — End: 2021-09-02

## 2020-11-17 MED ORDER — GALCANEZUMAB-GNLM 120 MG/ML SC SOAJ
SUBCUTANEOUS | 0 refills | Status: DC
Start: 2020-11-17 — End: 2020-11-26

## 2020-11-17 NOTE — Telephone Encounter (Signed)
 Order placed

## 2020-11-17 NOTE — Telephone Encounter (Signed)
 We can try Emgality monthly injection. The initial loading dose will be 2 injections and then 1 injection every month thereafter. Medication should be kept in fridge until about 30 minutes prior to administration. Injection site should be rotated. If patient needs demonstration,she may schedule a nurses visit. Also, Dr. Shelly Bombard recommended a follow up. Please inform patient that she should not get pregnant while on CGRP mAB (and for 6 months after stopping). Thank you.

## 2020-11-26 ENCOUNTER — Encounter (INDEPENDENT_AMBULATORY_CARE_PROVIDER_SITE_OTHER): Payer: Self-pay

## 2020-11-26 MED ORDER — GALCANEZUMAB-GNLM 120 MG/ML SC SOAJ
SUBCUTANEOUS | 0 refills | Status: AC
Start: 2020-11-26 — End: 2020-12-26

## 2020-11-26 NOTE — Telephone Encounter (Signed)
 Can you please addvise? //fa

## 2020-11-26 NOTE — Telephone Encounter (Signed)
 Responded to this previously. Kristen Olson

## 2020-12-01 ENCOUNTER — Encounter (INDEPENDENT_AMBULATORY_CARE_PROVIDER_SITE_OTHER): Payer: Self-pay | Admitting: Neurology

## 2020-12-17 ENCOUNTER — Encounter (INDEPENDENT_AMBULATORY_CARE_PROVIDER_SITE_OTHER): Payer: Self-pay | Admitting: Neurology

## 2020-12-31 ENCOUNTER — Encounter (INDEPENDENT_AMBULATORY_CARE_PROVIDER_SITE_OTHER): Payer: Self-pay | Admitting: Hospital

## 2021-01-03 ENCOUNTER — Encounter (INDEPENDENT_AMBULATORY_CARE_PROVIDER_SITE_OTHER): Payer: Self-pay | Admitting: Hospital

## 2021-01-03 NOTE — Telephone Encounter (Signed)
 01/03/21    Spoke to pt and filled out the missing parts. Faxed form back to [employer]).    [phone]

## 2021-01-03 NOTE — Telephone Encounter (Signed)
 01/03/21 Selena Batten,   Received call from pt regarding missing information from ADA Forms, stated that page 1 is missing Providers name, Number, and Practice. Page 3 question 3 "what major life activities are affected when the disabling condition is active" was not circled. Pt stated if it'd be possible to specify the limitations and possible to match information of forms to letter. Pt would also like to know if it'd be possible to submit forms as PDF on mychart due to having complications trying to open file. Please advise. Call back [phone] Thank you, Purnell Shoemaker Philipp Deputy

## 2021-01-26 ENCOUNTER — Telehealth: Admitting: Medical

## 2021-01-26 ENCOUNTER — Encounter (INDEPENDENT_AMBULATORY_CARE_PROVIDER_SITE_OTHER): Payer: Self-pay | Admitting: Medical

## 2021-01-26 VITALS — Ht 61.0 in | Wt 160.9 lb

## 2021-01-26 MED ORDER — AJOVY 225 MG/1.5ML SC SOAJ
225.0000 mg | SUBCUTANEOUS | 3 refills | Status: DC
Start: 2021-01-26 — End: 2022-12-20

## 2021-01-26 MED ORDER — UBROGEPANT 100 MG PO TABS
100.0000 mg | ORAL_TABLET | Freq: Once | ORAL | 11 refills | Status: DC | PRN
Start: 2021-01-26 — End: 2021-06-28

## 2021-03-24 ENCOUNTER — Encounter: Payer: Self-pay | Admitting: Physician Assistant

## 2021-03-24 DIAGNOSIS — K5900 Constipation, unspecified: Secondary | ICD-10-CM

## 2021-04-07 ENCOUNTER — Telehealth (INDEPENDENT_AMBULATORY_CARE_PROVIDER_SITE_OTHER): Payer: Self-pay

## 2021-04-07 NOTE — Telephone Encounter (Signed)
Received a call from the patients PCP's office they were inquiring on if the referral they sent in for the patient had been reviewed yet. I reviewed the chart and let the patient know the referral has been reviewed and we would be able to schedule the patient when she calls in. No further assistance was needed. Thank you.

## 2021-04-13 ENCOUNTER — Telehealth (HOSPITAL_BASED_OUTPATIENT_CLINIC_OR_DEPARTMENT_OTHER): Payer: Self-pay

## 2021-04-13 ENCOUNTER — Encounter (INDEPENDENT_AMBULATORY_CARE_PROVIDER_SITE_OTHER): Payer: Self-pay | Admitting: Medical

## 2021-04-13 NOTE — Telephone Encounter (Signed)
Patient calling to inquire about referral.   Department patient is being referred to: Infectious Disease    When was the referral sent: 04/13/2021   If its greater than 3 days, please advise patient to contact the referring MD's office to send fax again.     Fax number provided was:  [phone]   If patient provides you the incorrect fax number, please provide the correct fax number.       Patient informed of turnaround time?: yes  Turnaround times: 3 business days    If patient is calling outside of turnaround time, please advise patient to contact the referring MD's office to send fax again and escalate to your supervisor/manager.   Documenting as FYI.

## 2021-04-18 ENCOUNTER — Encounter (HOSPITAL_BASED_OUTPATIENT_CLINIC_OR_DEPARTMENT_OTHER): Payer: Self-pay

## 2021-04-25 ENCOUNTER — Encounter (INDEPENDENT_AMBULATORY_CARE_PROVIDER_SITE_OTHER): Payer: Self-pay | Admitting: Specialist

## 2021-04-25 ENCOUNTER — Encounter (INDEPENDENT_AMBULATORY_CARE_PROVIDER_SITE_OTHER): Payer: Self-pay | Admitting: Medical

## 2021-04-25 DIAGNOSIS — K5900 Constipation, unspecified: Secondary | ICD-10-CM

## 2021-04-28 ENCOUNTER — Encounter (INDEPENDENT_AMBULATORY_CARE_PROVIDER_SITE_OTHER): Payer: Self-pay | Admitting: Neurology

## 2021-04-28 ENCOUNTER — Encounter (INDEPENDENT_AMBULATORY_CARE_PROVIDER_SITE_OTHER): Payer: Self-pay

## 2021-04-29 ENCOUNTER — Encounter (INDEPENDENT_AMBULATORY_CARE_PROVIDER_SITE_OTHER): Payer: Self-pay | Admitting: Neurology

## 2021-05-09 ENCOUNTER — Encounter (INDEPENDENT_AMBULATORY_CARE_PROVIDER_SITE_OTHER): Payer: Self-pay | Admitting: Medical

## 2021-05-12 ENCOUNTER — Encounter (INDEPENDENT_AMBULATORY_CARE_PROVIDER_SITE_OTHER): Payer: Self-pay

## 2021-05-12 ENCOUNTER — Encounter (INDEPENDENT_AMBULATORY_CARE_PROVIDER_SITE_OTHER): Payer: Self-pay | Admitting: Hospital

## 2021-05-12 ENCOUNTER — Telehealth (INDEPENDENT_AMBULATORY_CARE_PROVIDER_SITE_OTHER): Payer: Self-pay | Admitting: Medical

## 2021-05-13 ENCOUNTER — Encounter (INDEPENDENT_AMBULATORY_CARE_PROVIDER_SITE_OTHER): Payer: Self-pay

## 2021-05-13 ENCOUNTER — Encounter (INDEPENDENT_AMBULATORY_CARE_PROVIDER_SITE_OTHER): Payer: Self-pay | Admitting: Hospital

## 2021-05-19 ENCOUNTER — Encounter (INDEPENDENT_AMBULATORY_CARE_PROVIDER_SITE_OTHER): Admitting: Physical Medicine & Rehabilitation

## 2021-06-02 ENCOUNTER — Ambulatory Visit (HOSPITAL_BASED_OUTPATIENT_CLINIC_OR_DEPARTMENT_OTHER): Payer: Medicaid Other

## 2021-06-07 ENCOUNTER — Telehealth (INDEPENDENT_AMBULATORY_CARE_PROVIDER_SITE_OTHER): Admitting: Neurology

## 2021-06-09 ENCOUNTER — Ambulatory Visit (INDEPENDENT_AMBULATORY_CARE_PROVIDER_SITE_OTHER): Admitting: Physical Medicine & Rehabilitation

## 2021-06-09 ENCOUNTER — Encounter (INDEPENDENT_AMBULATORY_CARE_PROVIDER_SITE_OTHER): Payer: Self-pay

## 2021-06-09 ENCOUNTER — Encounter (INDEPENDENT_AMBULATORY_CARE_PROVIDER_SITE_OTHER): Payer: Self-pay | Admitting: Physical Medicine & Rehabilitation

## 2021-06-09 VITALS — BP 90/62 | HR 71 | Ht 61.0 in | Wt 200.2 lb

## 2021-06-09 MED ORDER — LIDOCAINE HCL 1 % IJ SOLN
10.0000 mL | Freq: Once | INTRAMUSCULAR | Status: AC
Start: 2021-06-09 — End: 2021-06-10

## 2021-06-09 MED ORDER — TRIAMCINOLONE ACETONIDE 10 MG/ML IJ SUSP
10.0000 mg | Freq: Once | INTRAMUSCULAR | Status: AC
Start: 2021-06-09 — End: 2021-06-10

## 2021-06-17 ENCOUNTER — Encounter (INDEPENDENT_AMBULATORY_CARE_PROVIDER_SITE_OTHER): Payer: Self-pay | Admitting: Medical

## 2021-06-20 ENCOUNTER — Encounter (INDEPENDENT_AMBULATORY_CARE_PROVIDER_SITE_OTHER): Payer: Self-pay | Admitting: Medical

## 2021-06-20 DIAGNOSIS — G43709 Chronic migraine without aura, not intractable, without status migrainosus: Secondary | ICD-10-CM

## 2021-06-20 MED ORDER — NURTEC 75 MG PO TBDP
75.0000 mg | ORAL_TABLET | Freq: Every day | ORAL | 2 refills | Status: DC | PRN
Start: 2021-06-20 — End: 2021-10-03

## 2021-06-24 ENCOUNTER — Encounter (INDEPENDENT_AMBULATORY_CARE_PROVIDER_SITE_OTHER): Payer: Self-pay | Admitting: Hospital

## 2021-06-28 ENCOUNTER — Telehealth: Admitting: Medical

## 2021-06-28 ENCOUNTER — Encounter (INDEPENDENT_AMBULATORY_CARE_PROVIDER_SITE_OTHER): Payer: Self-pay | Admitting: Medical

## 2021-06-28 MED ORDER — DEXAMETHASONE 4 MG OR TABS
ORAL_TABLET | ORAL | 0 refills | Status: DC
Start: 2021-06-28 — End: 2021-09-16

## 2021-06-29 ENCOUNTER — Telehealth (INDEPENDENT_AMBULATORY_CARE_PROVIDER_SITE_OTHER): Payer: Self-pay | Admitting: Medical

## 2021-07-19 ENCOUNTER — Telehealth (INDEPENDENT_AMBULATORY_CARE_PROVIDER_SITE_OTHER): Admitting: Medical

## 2021-07-25 ENCOUNTER — Encounter (INDEPENDENT_AMBULATORY_CARE_PROVIDER_SITE_OTHER): Payer: Self-pay | Admitting: Medical

## 2021-07-25 DIAGNOSIS — G43709 Chronic migraine without aura, not intractable, without status migrainosus: Secondary | ICD-10-CM

## 2021-07-27 MED ORDER — LASMIDITAN SUCCINATE 100 MG PO TABS
100.0000 mg | ORAL_TABLET | ORAL | 5 refills | Status: AC | PRN
Start: 2021-07-27 — End: ?

## 2021-08-04 NOTE — Progress Notes (Signed)
Consulting Provider: Artemio Aly    Reason for Visit: IBS-C    History of Present Illness:   Kristen Olson is a 26 year old female with obesity, migraines, POTS, tachycardia s/p pacemaker/defib 2019, hx anorexia, NAFLD, GERD, IBS-C since her teens. Has worsened over time. Says she only has BM 1-2 times a month, just small pebbles. Crampy lower abd pain for minutes to hours throughout day. Pain improves after BM. Has small spots of blood about once a month when passing hard stool.     Takes 2 tablespoons psyllium bid, 17g Miralax bid, two 8.104m senna, and 1070mdocusate per day. Has tried daily dulcolax in past without relief. Uses enema every couple months which gives diarrhea and feels little better. States she has tried linzess, amitiza, trulance, without any improvement. Motegrity gave her BM once a week but pain got worse so stopped it. Unable to get off verapamil. Tried colonoscopy twice a couple years ago with three day prep of 3 gallons of Nulytely, enema, and another laxative but still unable to get cleaned out.     Reflux controlled with 2050mmeprazole 1-2 times a day. If stops it she gets severe reflux. Dysphagia to solids and liquids 1-2 times a week. Will feel something in lower throat but can keep eating. Will pass after 5-10 minutes. Had EGD two years ago, reportedly normal but don't have records. No nausea, vomiting, weight loss, early satiety. Drinks 1 cup of coffee per day. No nicotine, alcohol, soda, chocolate, NSAIDs.    03/18/2021 OSH      Patient Active Problem List    Diagnosis Date Noted   . Surveillance for birth control, oral contraceptives 03/25/2018       Past Medical History:   Diagnosis Date   . Depression    . Migraine    . Spinal lordosis    . Tachycardia     Hx Tachycardia, coronary artery spasms - sees cardiologist, on meds. Defibrillator placed 09/2017.       Past Surgical History:   Procedure Laterality Date   . CARDIAC DEFIBRILLATOR PLACEMENT  09/2017        Current Medications:  dexAMETHasone (DECADRON) 4 MG tablet, Take 3 tabs every morning x 3 days, 2 tabs every morning x 2 days, then 1 tab in the morning x 1 day  docusate sodium (COLACE) 250 MG capsule, Take 1 capsule (250 mg) by mouth 2 times daily.  escitalopram (LEXAPRO) 10 MG tablet,   fremanezumab-vfrm (AJOVY) injection, Inject 1.5 mL (225 mg) under the skin every 30 days.  galcanezumab-gnlm (EMGALITY) 120 mg/mL injection pen, Inject 1 mL (120 mg) under the skin every 30 days.  isosorbide dinitrate (ISORDIL) 30 MG tablet, Take 1 tablet (30 mg) by mouth every 8 hours.  lasmiditan (REYVOW) 100 MG tablet, Take 1 tablet (100 mg) by mouth as needed for Migraine.  levonorgestrel-ethinyl estradiol (SEASONIQUE) 0.15-0.03 &0.01 MG tablet, Take 1 tablet by mouth daily.  Magnesium Oxide 500 MG CAPS, TAKE 1 CAPSULE BY MOUTH EVERY DAY  nadolol (CORGARD) 20 MG tablet,   omeprazole (PRILOSEC) 20 MG capsule,   PEG 3350 with electrolytes (NULYTELY) 420 g solution, Take 4,000 mL by mouth daily for 5 days. Take one gallon every day for 4 days prior to the procedure and half a gallon the morning of the procedure.  Prucalopride Succinate (MOTEGRITY) 2 MG TABS, Take 2 mg by mouth daily.  rimegepant (NURTEC) tablet, Take 1 tablet (75 mg) on or under the tongue daily  as needed (migraine).  verapamil (CALAN SR) 180 MG Controlled-Release tablet,         Allergies   Allergen Reactions   . Gluten Meal Anaphylaxis       Social History     Socioeconomic History   . Marital status: Married   Tobacco Use   . Smoking status: Never   . Smokeless tobacco: Never   Substance and Sexual Activity   . Alcohol use: Not Currently   . Drug use: Never   . Sexual activity: Yes     Partners: Male     Birth control/protection: Pill       Family History   Problem Relation Name Age of Onset   . Cholesterol/Lipid Disorder Mother     . Heart Disease Mother         Review of Systems:  All systems have been reviewed and are negative except those mentioned  in history of present illness.    Physical examination:   LMP  (LMP Unknown)     Labs Reviewed:  No results found for: NA, K, CL, BICARB, BUN, CREAT  No results found for: SODSHS, POTSHS, CHLORSHS, CARDIOSHS, URNITBUN, CREATTINE  No results found for: AST, ALT, ALK, ALB, TBILI, DBILI  No results found for: ALKALPHOS, ALBUMIN, BILIRUBTOT  No results found for: WBC, RBC, HGB, HCT, MCV, PLT    Impression:   26 year old female with obesity, migraines, POTS, tachycardia s/p pacemaker/defib 2019, hx anorexia, NAFLD, GERD, IBS-C. Will need multiple meds for severe constipation. Will see if we can get her cleaned out with enemas and then get back on Motegrity prior to colonoscopy. Discussed risk of SI. She tolerated in past without issue. EGD/Bravo for chronic GERD.    Plan:  -increase Miralax to tid  -increase docusate to 239m bid  -restart Motegrity  -CT a/p  -ARM  -EGD/Bravo, colonoscopy  -rtc 320m  Orders Placed This Encounter   Procedures   . Anal Manometry & EMG   . CT Abdomen And Pelvis With Contrast   . Basic Metabolic Panel, Blood Green Plasma Separator Tube   . Liver Panel, Blood Green Plasma Separator Tube   . CBC w/ Diff Lavender   . Follow Up in This Department     Spent total of 60 minutes in face-to-face and non-face-to-face activities related to patient's visit today.   ---------------------(data below generated by MiJessy OtoMD)--------------------     Patient Verification & Telemedicine Consent & Financial Waiver:    1.   Identity: I have verified this patient's identity to be accurate.  2.   Consent: I verify consent has been secured in one of the following methods: (a) obtained written/ online attestation consent (via MyChartVideoVisit pathway), (b) the spoke-side provider has obtained verbal or written consent from patient/surrogate (if this is a "provider to provider" evaluation), or (c) in all other cases, I have personally obtained verbal consent from the patient/ surrogate (noting all  elements below) to perform this voluntary telemedicine evaluation (including obtaining history, performing examination and reviewing data provided by the patient).   The patient/ surrogate has the right to refuse this evaluation.  I have explained risks (including potential loss of confidentiality), benefits, alternatives, and the potential need for subsequent face to face care. Patient/ surrogate understands that there is a risk of medical inaccuracies given that our recommendations will be made based on reported data (and we must therefore assume this information is accurate).  Knowing that there is a  risk that this information is not reported accurately, and that the telemedicine video, audio, or data feed may be incomplete, the patient agrees to proceed with evaluation and holds [place] harmless knowing these risks.  3.   Healthcare Team: The patient/ surrogate has been notified that other healthcare professionals (including students, residents and Metallurgist) may be involved in this audio-video evaluation.   All laws concerning confidentiality and patient access to medical records and copies of medical records apply to telemedicine.  4.   Privacy: If this is a Radiographer, therapeutic Visit, the patient/ surrogate has received the [place] Notice of Privacy Practices via E-Checkin process.  For all other video visit techniques, I have verbally provided the patient/ surrogate with the [place] in [place] (https://[email or web address].se.aspx) or Spanish (https://health.https://www.matthews.info/.aspx).  The patient/ surrogate acknowledges both being provided the NPP link, and has been offered to have the NPP mailed to the patient/ surrogate by [place] mail.  The patient/ surrogate has voiced understanding an acknowledgement of receipt of this NPP web address.  If the patient/surrogate has elected to receive the NPP via [place] mail, I verify that the NPP will be sent promptly to the patient/surrogate via [place]  mail.  5.   Capacity: I have reviewed this above verification and consent paragraph with the patient/ surrogate and the patient is capacitated or has a surrogate. If the patient is not capacitated to understand the above, and no surrogate is available, since this is not an emergency evaluation, the visit will be rescheduled until such time that the patient can consent, or the surrogate is available to consent. If this is an emergency evaluation and the patient is not capacitated to understand the above, and no surrogate is available, I am proceeding with this evaluation as this is felt to be an emergency setting and no appropriate specialist is available at the bedside to perform these evaluations.  6.   Financial Waiver: If this is a Radiographer, therapeutic Visit, the patient has been made aware of the financial waiver via E-Checkin process.  For all other video visit techniques, an E-Checkin process is not performed.  As such, I have personally verbally informed the patient/ surrogate that this evaluation will be a billable encounter similar to an in-person clinic visit, and the patient/ surrogate has agreed to pay the fee for services rendered.  If we are billing insurance for the patient's telehealth visit, her out-of-pocket cost will be determined based on her plan and will be billed to her.  The patient/ surrogate has also been informed that if the patient does not have insurance or does not wish to use insurance, [place] Google price for a primary care telehealth visit is $59.00 and specialist telehealth visit is $88.00.  I have further informed the patient/ surrogate that in the event the patient has additional services provided in conjunction with the specialty visit (Ex. Psychotherapy services), those services will be billed at the current rate less a 45% discount.  7.   Intra-State Location: The patient/ surrogate attests to understanding that if the patient accesses these services from a location  outside of [place], that the patient does so at the patient's own risk and initiative and that the patient is ultimately responsible for compliance with any laws or regulations associated with the patient's use.  8.   Specific Use:The patient/ surrogate understands that [facility] makes no representation that materials or servicesdelivered via telecommunication services, or listed on telemedicine websites, are appropriate or available for use  in any other location.           Demographics:  Medical Record #: [id number]  Date: August 05, 2021  Patient Name: Kristen Olson  DOB: 1996/03/27  Age: 26 year old  Sex: female  Location: Home address on file     Evaluator(s):  Elgie Landino was evaluated by me today.    Clinic Location: [facility]  [address]  [place] [address]

## 2021-08-05 ENCOUNTER — Telehealth (INDEPENDENT_AMBULATORY_CARE_PROVIDER_SITE_OTHER): Payer: Self-pay | Admitting: Gastroenterology

## 2021-08-05 ENCOUNTER — Encounter (INDEPENDENT_AMBULATORY_CARE_PROVIDER_SITE_OTHER): Payer: Self-pay | Admitting: Hospital

## 2021-08-05 ENCOUNTER — Other Ambulatory Visit: Payer: Self-pay

## 2021-08-05 ENCOUNTER — Telehealth (INDEPENDENT_AMBULATORY_CARE_PROVIDER_SITE_OTHER): Payer: Medicaid Other | Admitting: Gastroenterology

## 2021-08-05 ENCOUNTER — Encounter (INDEPENDENT_AMBULATORY_CARE_PROVIDER_SITE_OTHER): Payer: Self-pay | Admitting: Gastroenterology

## 2021-08-05 DIAGNOSIS — K581 Irritable bowel syndrome with constipation: Secondary | ICD-10-CM

## 2021-08-05 DIAGNOSIS — K219 Gastro-esophageal reflux disease without esophagitis: Secondary | ICD-10-CM

## 2021-08-05 DIAGNOSIS — R1030 Lower abdominal pain, unspecified: Secondary | ICD-10-CM

## 2021-08-05 MED ORDER — PEG 3350-KCL-NA BICARB-NACL 420 GM OR SOLR
4000.0000 mL | Freq: Every day | ORAL | 0 refills | Status: AC
Start: 2021-08-05 — End: 2021-08-10

## 2021-08-05 MED ORDER — DOCUSATE SODIUM 250 MG OR CAPS
250.0000 mg | ORAL_CAPSULE | Freq: Two times a day (BID) | ORAL | 1 refills | Status: DC
Start: 2021-08-05 — End: 2021-10-02

## 2021-08-05 MED ORDER — MOTEGRITY 2 MG PO TABS
2.0000 mg | ORAL_TABLET | Freq: Every day | ORAL | 1 refills | Status: DC
Start: 2021-08-05 — End: 2021-10-02

## 2021-08-05 NOTE — Interdisciplinary (Signed)
Called Patient to review AVS post visit.     No answer. Left message instructing patient to review the After Visit Summary and a complete list of their Provider's recommendations in MyChart, under appointment details.    Also, advised Patient to call back and schedule their recommended follow up as documented. Please call the GI Clinic at [phone].    Forwarded chart to Scheduling: Yes     AVS sent to patient: Yes    Breann Losano L Ezzie Senat

## 2021-08-05 NOTE — Patient Instructions (Addendum)
Send [place] the endoscopy and colonoscopy reports, ultrasound report, and latest notes from the liver doctor and heart doctor.  Take enemas every day for a week, increase Miralax to three times a day, 250mg  of docusate twice a day, and start Motegrity.  Take enemas for three days prior to the colonoscopy and two days prior to the anorectal manometry test.  Stop coffee.

## 2021-08-05 NOTE — Telephone Encounter (Signed)
Called Lugene Hitt to E-Room for visit today.      Patient did not answer.  Left Message for patient to call back to complete E-room process and provided Care Navigation Hub Number: [phone] to return call.      If patient returns call, please send me a secure chat, with the preferred call back number, to alert me to call patient and complete E-rooming process.      Thank you.     Oona Trammel L Gianmarco Roye

## 2021-08-08 ENCOUNTER — Telehealth (INDEPENDENT_AMBULATORY_CARE_PROVIDER_SITE_OTHER): Payer: Self-pay | Admitting: Gastroenterology

## 2021-08-08 DIAGNOSIS — K581 Irritable bowel syndrome with constipation: Secondary | ICD-10-CM | POA: Insufficient documentation

## 2021-08-08 DIAGNOSIS — R1030 Lower abdominal pain, unspecified: Secondary | ICD-10-CM | POA: Insufficient documentation

## 2021-08-08 DIAGNOSIS — K219 Gastro-esophageal reflux disease without esophagitis: Secondary | ICD-10-CM | POA: Insufficient documentation

## 2021-08-08 NOTE — Telephone Encounter (Signed)
Routing to RN pool for clarification on prep. Per office visit notes from 08/05/2021 visit with Dr. Micheline Maze, patient is to drink 1 full gallon of prep for 5 days before the procedure and complete one enema daily for 3 days prior to the procedure. Requesting clarification on the clear liquid diet. Should patient start the clear liquid diet 5 days, 2 days or 1 day before the procedure? Does it matter what time of day the patient takes the bowel prep solution or complete the enemas? Routing for assistance. Prep letter has not been sent yet and will be sent when clarification is received. Thank you.

## 2021-08-08 NOTE — Telephone Encounter (Addendum)
See prior TE.  Pending Dr Docherty's response.

## 2021-08-08 NOTE — Telephone Encounter (Signed)
Patient has been sent prep instructions for the anal manometry. EGD Bravo colonoscopy instructions are awaiting clarification due to the extended prep.

## 2021-08-08 NOTE — Telephone Encounter (Signed)
Pharmacy calling to ensure MD intended to Rx Peg 3350 quantity 5. Confirmed Rx quantity and instruction per MD written sig with support of LOV note below. Closing

## 2021-08-08 NOTE — Telephone Encounter (Signed)
Message routed Dr Micheline Maze,  Pt is supposed to be on bowel prep for 5 days before colonoscopy,  Nulytely " Sig: Take 4,000 mL by mouth daily for 5 days. Take one gallon every day for 4 days prior to the procedure and half a gallon the morning of the procedure."    Pt will be taking enema 3 days before colonoscopy.     Please advice if she should be on clear liquids during this time or can stay on low residue diet.

## 2021-08-10 NOTE — Telephone Encounter (Signed)
Message routed to schedulers,    Per Dr Micheline Maze, " Clear liquids starting 72 hours prior. "    So pt can take bowel prep in the morning except on the day prior when pt can take half gallon at night 6- 8 pm and the second half on the day of the procedure.  "Take one gallon every day for 4 days prior to the procedure and half a gallon the morning of the procedure."  Clear liquids just 72 hours prior to procedure can be on low residue diet before that.

## 2021-08-11 ENCOUNTER — Encounter (INDEPENDENT_AMBULATORY_CARE_PROVIDER_SITE_OTHER): Payer: Self-pay | Admitting: Gastroenterology

## 2021-08-12 ENCOUNTER — Encounter (INDEPENDENT_AMBULATORY_CARE_PROVIDER_SITE_OTHER): Payer: Self-pay | Admitting: Medical

## 2021-08-12 ENCOUNTER — Encounter (INDEPENDENT_AMBULATORY_CARE_PROVIDER_SITE_OTHER): Payer: Self-pay | Admitting: Gastroenterology

## 2021-08-12 NOTE — Telephone Encounter (Signed)
Pharmacy Services Initial Prior Authorization Request     Received notification that patient requires prior authorization for Prucalopride Succinate (MOTEGRITY) 2 MG TABS     Insurance [id number]     PLAN:  -- Submitted PA to [employer] via CoverMyMeds. Key: Julius Bowels

## 2021-08-19 ENCOUNTER — Telehealth (INDEPENDENT_AMBULATORY_CARE_PROVIDER_SITE_OTHER): Payer: Self-pay | Admitting: Gastroenterology

## 2021-08-19 NOTE — Telephone Encounter (Signed)
Spoke with patient. Not much change with Motegrity yet. Some cramping. No SI. She will try IBGard for the cramping.

## 2021-08-24 ENCOUNTER — Encounter: Payer: Self-pay | Admitting: Family

## 2021-08-24 DIAGNOSIS — E669 Obesity, unspecified: Secondary | ICD-10-CM

## 2021-08-25 ENCOUNTER — Telehealth (INDEPENDENT_AMBULATORY_CARE_PROVIDER_SITE_OTHER): Payer: Self-pay

## 2021-08-25 ENCOUNTER — Encounter (INDEPENDENT_AMBULATORY_CARE_PROVIDER_SITE_OTHER): Payer: Self-pay | Admitting: Hospital

## 2021-08-25 NOTE — Telephone Encounter (Signed)
Outgoing call to the patient, left a message to please read and respond to a Mychart message I've sent to her regarding a referral we have on file for her.

## 2021-08-25 NOTE — Telephone Encounter (Signed)
Caller:  Estevan   [facility]hip to [facility]: [facility]  Phone # [phone]  Preferred Method of Communication: call    Notes:    Estevan with [facility] calling to get the pt set for a bariatric surgery apt.     Informed Ernest Mallick will send message to team to review but pt needs to have access to mychart as information will be sent via portal. Per Estevan pt is active with mychart account. Asked estevan to please inform pt to watch for msg from the team. Estevan stated understanding.       For further questions can call clinic at [phone]    Pts phone number [phone]       Please assist     Caller has been advised of  72 hr turnaround time.

## 2021-08-26 ENCOUNTER — Encounter (INDEPENDENT_AMBULATORY_CARE_PROVIDER_SITE_OTHER): Payer: Self-pay | Admitting: Medical

## 2021-08-29 ENCOUNTER — Encounter (INDEPENDENT_AMBULATORY_CARE_PROVIDER_SITE_OTHER): Payer: Self-pay | Admitting: Medical

## 2021-08-30 ENCOUNTER — Encounter (INDEPENDENT_AMBULATORY_CARE_PROVIDER_SITE_OTHER): Payer: Self-pay | Admitting: Hospital

## 2021-08-30 ENCOUNTER — Telehealth (INDEPENDENT_AMBULATORY_CARE_PROVIDER_SITE_OTHER): Payer: Self-pay

## 2021-08-30 NOTE — Telephone Encounter (Signed)
Patient is calling to schedule initial appointment for Bariatric Surgery.    Patient has External referral    If External: is referral scanned into media? Yes     Patient has verified email on file and has an active Mychart account / been sent a MyChart activation code.    Patient aware that they will receive communication via Mychart within the next 24-48 business hours.      Hi Melanie, I wanted to let you know that I scheduled an appt for this patient by mistake for DOS 08/29. She is a New Consultation Patient. Please advise me what the next step would be or if I should cxlled the appt.

## 2021-09-02 ENCOUNTER — Encounter (INDEPENDENT_AMBULATORY_CARE_PROVIDER_SITE_OTHER): Payer: Self-pay | Admitting: Medical

## 2021-09-02 ENCOUNTER — Encounter (INDEPENDENT_AMBULATORY_CARE_PROVIDER_SITE_OTHER): Admitting: Medical

## 2021-09-14 ENCOUNTER — Telehealth (HOSPITAL_BASED_OUTPATIENT_CLINIC_OR_DEPARTMENT_OTHER): Payer: Self-pay | Admitting: Family

## 2021-09-14 NOTE — Telephone Encounter (Signed)
Confirmed of anal manometry appt on 09/20/2021.  Also went over check-in time of 6:45, and to complete enema prep the night before, bring completed questionnaire instructions given.[email or web address]

## 2021-09-15 ENCOUNTER — Telehealth (INDEPENDENT_AMBULATORY_CARE_PROVIDER_SITE_OTHER): Payer: Self-pay | Admitting: Gastroenterology

## 2021-09-15 NOTE — Telephone Encounter (Signed)
prep letter sent in mychart in letter section and messages forms and instructions sent to email patient verified recieved.

## 2021-09-16 ENCOUNTER — Telehealth: Admitting: Medical

## 2021-09-16 ENCOUNTER — Encounter (INDEPENDENT_AMBULATORY_CARE_PROVIDER_SITE_OTHER): Payer: Self-pay | Admitting: Medical

## 2021-09-16 ENCOUNTER — Telehealth (INDEPENDENT_AMBULATORY_CARE_PROVIDER_SITE_OTHER): Payer: Self-pay | Admitting: Medical

## 2021-09-16 VITALS — Ht 61.0 in | Wt 203.0 lb

## 2021-09-16 DIAGNOSIS — U099 Post covid-19 condition, unspecified: Secondary | ICD-10-CM

## 2021-09-16 DIAGNOSIS — G43709 Chronic migraine without aura, not intractable, without status migrainosus: Secondary | ICD-10-CM

## 2021-09-16 DIAGNOSIS — R4189 Other symptoms and signs involving cognitive functions and awareness: Secondary | ICD-10-CM

## 2021-09-16 MED ORDER — EPTINEZUMAB-JJMR 100 MG/ML IV SOLN
100.0000 mg | INTRAVENOUS | 3 refills | Status: DC
Start: 2021-09-16 — End: 2022-12-20

## 2021-09-16 MED ORDER — DEXAMETHASONE 4 MG OR TABS
ORAL_TABLET | ORAL | 0 refills | Status: DC
Start: 2021-09-16 — End: 2022-07-23

## 2021-09-16 NOTE — Progress Notes (Signed)
Neurology Follow-Up    Date of visit: 09/16/2021     Patient: Kristen Olson  Date of birth: 1996/04/08  MRN: [id number]    History of Present Illness:  The patient is here for 62-month follow up regarding headaches.     Headaches are daily with varying severity. She continues to have brain fog, word finding difficulty, and difficulty with memory following COVID infection last June. She tried to be seen in one of the Long COVID clinics but was denied by both [facility] and [facility]. 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 She cannot tolerate working in the office due to her headaches and it is difficult to concentrate.    Triggers include: stress, fatigue, poor sleep, and MSG    Current abortive medications: Nurtec or Reyvow (but causes loopiness)  Prior abortive medications: NSAIDs, opiates, Imitrex, Zomig, Ubrelvy, Nerve blocks - made HA worse    Current preventative medications: Verapamil, Nadolol, Lexapro, Ajovy  Prior preventative medications: Effexor, Prozac, Elavil, Topamax, and Neurontin    Past Medical History:  Past Medical History:   Diagnosis Date   . Depression    . Migraine    . Spinal lordosis    . Tachycardia     Hx Tachycardia, coronary artery spasms - sees cardiologist, on meds. Defibrillator placed 09/2017.          Current Outpatient Medications:   .  dexAMETHasone (DECADRON) 4 MG tablet, Take 3 tabs every morning x 3 days, 2 tabs every morning x 2 days, then 1 tab in the morning x 1 day, Disp: 14 tablet, Rfl: 0  .  docusate sodium (COLACE) 250 MG capsule, Take 1 capsule (250 mg) by mouth 2 times daily., Disp: 60 capsule, Rfl: 1  .  eptinezumab-jjmr (VYEPTI) 100 MG/ML SOLN, Inject 1 mL (100 mg) into vein Every 3 months., Disp: 1.12 mL, Rfl: 3  .  escitalopram (LEXAPRO) 10 MG tablet, , Disp: , Rfl: 0  .  fremanezumab-vfrm (AJOVY) injection, Inject 1.5 mL (225 mg) under the skin every 30 days., Disp: 4.5 mL, Rfl: 3  .  lasmiditan (REYVOW) 100 MG tablet, Take 1 tablet (100 mg) by mouth as needed for  Migraine., Disp: 8 tablet, Rfl: 5  .  Magnesium Oxide 500 MG CAPS, TAKE 1 CAPSULE BY MOUTH EVERY DAY, Disp: 30 capsule, Rfl: 11  .  nadolol (CORGARD) 20 MG tablet, , Disp: , Rfl: 0  .  omeprazole (PRILOSEC) 20 MG capsule, , Disp: , Rfl: 0  .  Prucalopride Succinate (MOTEGRITY) 2 MG TABS, Take 2 mg by mouth daily., Disp: 30 tablet, Rfl: 1  .  rimegepant (NURTEC) tablet, Take 1 tablet (75 mg) on or under the tongue daily as needed (migraine)., Disp: 8 tablet, Rfl: 2  .  verapamil (CALAN SR) 180 MG Controlled-Release tablet, , Disp: , Rfl: 0    Allergies   Allergen Reactions   . Gluten Meal Anaphylaxis       Review of Systems:  As outlined above in the HPI      Objective:  Ht 5\' 1"  (1.549 m)   Wt 92.1 kg (203 lb)   BMI 38.36 kg/m     General: lights are dim, she has her headset on for working from home, no acute distress  HEENT: normocephalic, atraumatic  Respiratory: normal respiratory effort  Skin: no rash noted  Neurologic:   Alert and oriented x 3.   Recent and remote memory intact  Speech is fluent, no dysarthria.  Comprehension is intact.   No ophthalmoplegia  Symmetric facial movements.   Hearing is grossly intact  Psychiatric: appropriate mood and affect, normal judgment and insight    Assessment/Plan:    ICD-10-CM ICD-9-CM   1. 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 Brain fog  R41.89 [phone]       26 year old female with chronic migraine and post-COVID neurocognitive difficulties. Headaches are poorly controlled on Ajovy, will switch to Vyepti. She is fearful of Botox because she developed worse headache following a prior nerve block. Considered Aimovig previously but she has baseline severe constipation. Other considerations include Qulipta. For abortive treatment, I will have her continue Nurtec and Reyvow. She would like to try a decadron taper as she never did receive the prescription we gave previously so I will  send it in again but we discussed that this is not a maintenance or reliable rescue option. Given history of coronary vasospasms requiring usage of nitrates periodically, I recommend she avoid triptans, which have not worked well anyway.     In terms of the post-COVID neurocognitive symptoms, I will refer her to speech therapy.    -Decadron taper  -Switch Ajovy to Vyepti  -Nurtec and/or Reyvow as needed. She knows not to drive within 8 hrs.  -Speech therapy referral  -F/U 3 months    Signature:  Electronically signed by: Jeanelle MallingKathleen Kasia Trego, PA-C  Signature Derived From Controlled Access Password, September 16, 2021    Time spent in review of records, documentation, evaluation of the patient, and discussion of the plan: 25 min    Medical Decision Making:  Number and/or complexity of problems addressed: moderate - 2 stable chronic conditions  Amount and/or complexity of data to review and analyze: minimal  Risk: moderate - rx mgmt  Overall: moderate      Patient Verification & Telemedicine Consent:    I am proceeding with this evaluation at the direct request of the patient.  I have verified this is the correct patient and have obtained verbal consent and written consent from the patient/ surrogate to perform this voluntary telemedicine evaluation (including obtaining history, performing examination and reviewing data provided by the patient).   The patient/surrogate has the right to refuse this evaluation.  I have explained risks (including potential loss of confidentiality), benefits, alternatives, and the potential need for subsequent face to face care. Patient/surrogate understands that there is a risk of medical inaccuracies given that our recommendations will be made based on reported data (and we must therefore assume this information is accurate).  Knowing that there is a risk that this information is not reported accurately, and that the telemedicine video, audio, or data feed may be incomplete, the patient  agrees to proceed with evaluation and holds us harmless knowing these risks. All laws concerning confidentiality and patient access to medical records and copies of medical records apply to telemedicine.  The patient/surrogate has received [facility] Notice of Privacy Practices.  I have reviewed this above verification and consent paragraph with the patient/surrogate.  If the patient is not capacitated to understand the above, and no surrogate is available, since this is not an emergency evaluation, the visit will be rescheduled until such time that the patient can consent, or the surrogate is available to consent.    Demographics:  Medical Record #: [id number]   Date: September 16, 2021   Patient Name: Kristen CofferMichelle Alejandra  Olson   DOB: Jul 20, 1995  Age: 26 year old  Sex: female  Location: Home address on file      Evaluator(s):   Kristen Olson was evaluated by me today.    Clinic Location:  [facility] [place]  [facility] [place]  [address]  [place] [address]

## 2021-09-16 NOTE — Interdisciplinary (Signed)
I, Shimon Trowbridge, roomed and reviewed all medications and allergies with patient. //jm

## 2021-09-16 NOTE — Telephone Encounter (Signed)
09/16/21 Spoke to patient and she informed ma that she never received the decadron rx from Dr. Joellyn Rued and is requesting that the order be sent in. //jm

## 2021-09-17 NOTE — Progress Notes (Signed)
Visit details reviewed and approved by supervising provider Fidela Salisbury, MD on 09/17/2021    Electronically signed by: Fidela Salisbury, MD on 09/17/2021

## 2021-09-20 ENCOUNTER — Ambulatory Visit (HOSPITAL_BASED_OUTPATIENT_CLINIC_OR_DEPARTMENT_OTHER): Payer: Medicaid Other

## 2021-09-22 ENCOUNTER — Encounter (INDEPENDENT_AMBULATORY_CARE_PROVIDER_SITE_OTHER): Payer: Self-pay

## 2021-09-26 ENCOUNTER — Other Ambulatory Visit (INDEPENDENT_AMBULATORY_CARE_PROVIDER_SITE_OTHER): Payer: Self-pay | Admitting: Gastroenterology

## 2021-09-26 DIAGNOSIS — K581 Irritable bowel syndrome with constipation: Secondary | ICD-10-CM

## 2021-09-30 ENCOUNTER — Other Ambulatory Visit (INDEPENDENT_AMBULATORY_CARE_PROVIDER_SITE_OTHER): Payer: Self-pay | Admitting: Medical

## 2021-09-30 ENCOUNTER — Other Ambulatory Visit (INDEPENDENT_AMBULATORY_CARE_PROVIDER_SITE_OTHER): Payer: Self-pay | Admitting: Otolaryngology

## 2021-09-30 DIAGNOSIS — G43709 Chronic migraine without aura, not intractable, without status migrainosus: Secondary | ICD-10-CM

## 2021-09-30 DIAGNOSIS — G43109 Migraine with aura, not intractable, without status migrainosus: Secondary | ICD-10-CM

## 2021-10-02 MED ORDER — DOCUSATE SODIUM 250 MG OR CAPS
250.00 mg | ORAL_CAPSULE | Freq: Two times a day (BID) | ORAL | 2 refills | Status: AC
Start: 2021-10-02 — End: ?

## 2021-10-03 NOTE — Telephone Encounter (Signed)
Date: 10/03/21    Received:erx  Medication/Strength: Nurtec 75 mg tablet   [id number] 1 tablet as needed for migraine. No more than 1 tablet per 24 hrs   Quantity requested:8    Last refill date:09/08/21  Last ov:09/16/21  Next ov: none     Pharmacy/Phone number:  [facility], [address] [phone]    Notes:    efxd approved //IT

## 2021-10-05 ENCOUNTER — Telehealth (HOSPITAL_BASED_OUTPATIENT_CLINIC_OR_DEPARTMENT_OTHER): Payer: Self-pay | Admitting: Gastroenterology

## 2021-10-05 NOTE — Telephone Encounter (Signed)
LVM to remind pt of 7:15 am anal manometry appt on 10/10/2021. Also went over check-in time of 6:45 am, and to complete enema prep the night before, bring completed questionnaire, and pay for 3 hour parking prior to entering building.

## 2021-10-06 ENCOUNTER — Telehealth (INDEPENDENT_AMBULATORY_CARE_PROVIDER_SITE_OTHER): Payer: Self-pay | Admitting: Otolaryngology

## 2021-10-06 NOTE — Telephone Encounter (Signed)
Prescription Refill Request   Faxed to [facility]  Incoming fax refill request from pharmacy     Prescribing provider: Dr. Jessee Avers    Last office visit: 05/08/2014  Next office visit: none     Requested Medication/s:      Send to:     [facility] [place] [address]  Phone: [phone] Fax: [phone]      Is patient out of medication? n/a    When did patient run out of medication? n/a    Has Patient been advised this message will be transmitted to office and if any issues can expect a response within the next 72 hours? N

## 2021-10-07 MED ORDER — MAGNESIUM OXIDE 500 MG OR CAPS
ORAL_CAPSULE | ORAL | 11 refills | Status: AC
Start: 2021-10-07 — End: ?

## 2021-10-07 NOTE — Telephone Encounter (Signed)
Confirmed of anal manometry appt on 10/10/21.  Also went over check-in time of 6:45, and to complete enema prep the night before, bring completed questionnaire instructions given.K[facility]en [facility]

## 2021-10-07 NOTE — Telephone Encounter (Signed)
System pended Magnesium Oxide 500 MG CAPS medication refill as requested by patient for Dr. Rogelia Mire review and signature. Will contact patient if approved for patient to schedule a return appointment as patient has not been seen since 04/2014.    Last visit with this physician:   05/08/2014    Next appointment with this physician:  Visit date not found

## 2021-10-09 ENCOUNTER — Other Ambulatory Visit: Payer: Self-pay

## 2021-10-10 ENCOUNTER — Ambulatory Visit
Admission: RE | Admit: 2021-10-10 | Discharge: 2021-10-10 | Disposition: A | Discharge status: Home / Self Care | Payer: Medicaid Other | Attending: Gastroenterology | Admitting: Gastroenterology

## 2021-10-10 DIAGNOSIS — K581 Irritable bowel syndrome with constipation: Secondary | ICD-10-CM | POA: Insufficient documentation

## 2021-10-10 NOTE — Patient Instructions (Signed)
GI Motility and Physiology Discharge Instructions      You had an Anal Manometry study done today at [facility].      -You do not have any restrictions after the test.       -Please resume your pre-test diet, ALL medications and activity.       Contact your referring provider in 10 business days so that they may discuss your test results with you.           If you have any questions, please call the [facility] Clinic at [phone].     After hours or weekends please call [phone] and ask for the GI Fellow on call for emergency questions.    I have reviewed and understand my discharge instructions.  I have had any questions answered.     Thank you for allowing us to participate in your care.

## 2021-10-10 NOTE — Procedures (Signed)
Patient presented today for anorectal manometry, EMG and Balloon expulsion test.  The order for the EMG test was:  left lateral position (probe held in place) and commode (position of probe confirmed pre and post commode position).    Balloon expulsion test:  the patient was unable to expel a 50 ml water-filled balloon in  2 minutes.

## 2021-10-10 NOTE — Telephone Encounter (Signed)
See refill encounter dated 09/30/21 medication has already been refilled.

## 2021-10-13 ENCOUNTER — Encounter (HOSPITAL_BASED_OUTPATIENT_CLINIC_OR_DEPARTMENT_OTHER): Payer: Self-pay | Admitting: Speech-Language Pathologist

## 2021-10-21 DIAGNOSIS — K581 Irritable bowel syndrome with constipation: Secondary | ICD-10-CM

## 2021-10-25 ENCOUNTER — Encounter (INDEPENDENT_AMBULATORY_CARE_PROVIDER_SITE_OTHER): Payer: Self-pay | Admitting: Gastroenterology

## 2021-10-25 DIAGNOSIS — K5902 Outlet dysfunction constipation: Secondary | ICD-10-CM

## 2021-10-28 NOTE — Telephone Encounter (Signed)
Lab orders pending for [employer] as the resulting agency.

## 2021-11-02 ENCOUNTER — Encounter (INDEPENDENT_AMBULATORY_CARE_PROVIDER_SITE_OTHER): Payer: Self-pay | Admitting: Hospital

## 2021-11-08 ENCOUNTER — Encounter (INDEPENDENT_AMBULATORY_CARE_PROVIDER_SITE_OTHER): Payer: Medicaid Other | Admitting: Gastroenterology

## 2021-11-16 ENCOUNTER — Ambulatory Visit (HOSPITAL_BASED_OUTPATIENT_CLINIC_OR_DEPARTMENT_OTHER): Payer: Medicaid Other | Admitting: Speech-Language Pathologist

## 2021-11-30 ENCOUNTER — Encounter (INDEPENDENT_AMBULATORY_CARE_PROVIDER_SITE_OTHER): Payer: Self-pay | Admitting: Medical

## 2021-11-30 ENCOUNTER — Encounter (INDEPENDENT_AMBULATORY_CARE_PROVIDER_SITE_OTHER): Payer: Self-pay | Admitting: Gastroenterology

## 2021-12-09 NOTE — Telephone Encounter (Signed)
Katie,   Please advise if there is an alternative cost effect treatment for patient because she does not have insurance.

## 2021-12-12 NOTE — Telephone Encounter (Signed)
Not really. Her heart condition makes triptans unsafe. We can give her some samples for the time being until her new insurance is active.

## 2021-12-13 ENCOUNTER — Telehealth (INDEPENDENT_AMBULATORY_CARE_PROVIDER_SITE_OTHER): Admitting: Medical

## 2021-12-13 ENCOUNTER — Encounter (INDEPENDENT_AMBULATORY_CARE_PROVIDER_SITE_OTHER): Payer: Self-pay | Admitting: Medical

## 2021-12-22 ENCOUNTER — Telehealth (INDEPENDENT_AMBULATORY_CARE_PROVIDER_SITE_OTHER): Payer: Self-pay | Admitting: Gastroenterology

## 2021-12-22 NOTE — Telephone Encounter (Signed)
Patient has been sent mychart message requesting updated insurance for procedure scheduled on 02/06/2022 with Dr. Micheline Maze. Please update insurance when patient calls back. Thank you.

## 2022-01-03 NOTE — Telephone Encounter (Signed)
Left message for patient to call back to obtain missing insurance. Please assist with updating insurance when patient calls back.

## 2022-01-04 NOTE — Telephone Encounter (Signed)
Left message for patient to call back to update insurance for 02/06/2022 EGD Bravo with Dr. Micheline Maze. Please update insurance when patient calls back. Thank you.

## 2022-01-17 ENCOUNTER — Ambulatory Visit (HOSPITAL_BASED_OUTPATIENT_CLINIC_OR_DEPARTMENT_OTHER): Payer: Self-pay

## 2022-01-24 ENCOUNTER — Institutional Professional Consult (permissible substitution) (INDEPENDENT_AMBULATORY_CARE_PROVIDER_SITE_OTHER): Payer: Medicaid Other | Admitting: Internal Medicine

## 2022-02-06 ENCOUNTER — Encounter (HOSPITAL_BASED_OUTPATIENT_CLINIC_OR_DEPARTMENT_OTHER): Admission: RE | Payer: Self-pay

## 2022-02-06 DIAGNOSIS — R1030 Lower abdominal pain, unspecified: Secondary | ICD-10-CM | POA: Insufficient documentation

## 2022-02-06 DIAGNOSIS — K581 Irritable bowel syndrome with constipation: Secondary | ICD-10-CM | POA: Insufficient documentation

## 2022-02-06 DIAGNOSIS — K219 Gastro-esophageal reflux disease without esophagitis: Secondary | ICD-10-CM | POA: Insufficient documentation

## 2022-07-21 ENCOUNTER — Inpatient Hospital Stay
Admission: EM | Admit: 2022-07-21 | Discharge: 2022-07-23 | DRG: 917 | Disposition: A | Discharge status: Other Acute Inpatient Hospital | Payer: 59 | Attending: Hospitalist | Admitting: Hospitalist

## 2022-07-21 DIAGNOSIS — I959 Hypotension, unspecified: Secondary | ICD-10-CM

## 2022-07-21 DIAGNOSIS — K219 Gastro-esophageal reflux disease without esophagitis: Secondary | ICD-10-CM | POA: Diagnosis present

## 2022-07-21 DIAGNOSIS — Z95810 Presence of automatic (implantable) cardiac defibrillator: Secondary | ICD-10-CM

## 2022-07-21 DIAGNOSIS — E162 Hypoglycemia, unspecified: Secondary | ICD-10-CM | POA: Diagnosis not present

## 2022-07-21 DIAGNOSIS — T461X1A Poisoning by calcium-channel blockers, accidental (unintentional), initial encounter: Secondary | ICD-10-CM

## 2022-07-21 DIAGNOSIS — J45909 Unspecified asthma, uncomplicated: Secondary | ICD-10-CM | POA: Diagnosis present

## 2022-07-21 DIAGNOSIS — F419 Anxiety disorder, unspecified: Secondary | ICD-10-CM | POA: Diagnosis present

## 2022-07-21 DIAGNOSIS — Z91128 Patient's intentional underdosing of medication regimen for other reason: Secondary | ICD-10-CM

## 2022-07-21 DIAGNOSIS — K581 Irritable bowel syndrome with constipation: Secondary | ICD-10-CM | POA: Diagnosis present

## 2022-07-21 DIAGNOSIS — F424 Excoriation (skin-picking) disorder: Secondary | ICD-10-CM | POA: Diagnosis present

## 2022-07-21 DIAGNOSIS — T461X2A Poisoning by calcium-channel blockers, intentional self-harm, initial encounter: Principal | ICD-10-CM | POA: Diagnosis present

## 2022-07-21 DIAGNOSIS — R001 Bradycardia, unspecified: Secondary | ICD-10-CM | POA: Diagnosis present

## 2022-07-21 DIAGNOSIS — Z79899 Other long term (current) drug therapy: Secondary | ICD-10-CM

## 2022-07-21 DIAGNOSIS — T43216A Underdosing of selective serotonin and norepinephrine reuptake inhibitors, initial encounter: Secondary | ICD-10-CM | POA: Diagnosis present

## 2022-07-21 DIAGNOSIS — Z8249 Family history of ischemic heart disease and other diseases of the circulatory system: Secondary | ICD-10-CM

## 2022-07-21 DIAGNOSIS — R739 Hyperglycemia, unspecified: Secondary | ICD-10-CM | POA: Diagnosis not present

## 2022-07-21 DIAGNOSIS — E669 Obesity, unspecified: Secondary | ICD-10-CM | POA: Diagnosis present

## 2022-07-21 DIAGNOSIS — Z309 Encounter for contraceptive management, unspecified: Secondary | ICD-10-CM

## 2022-07-21 DIAGNOSIS — R570 Cardiogenic shock: Secondary | ICD-10-CM | POA: Diagnosis present

## 2022-07-21 DIAGNOSIS — F329 Major depressive disorder, single episode, unspecified: Secondary | ICD-10-CM | POA: Diagnosis present

## 2022-07-21 DIAGNOSIS — F32A Depression, unspecified: Secondary | ICD-10-CM | POA: Insufficient documentation

## 2022-07-21 DIAGNOSIS — G43909 Migraine, unspecified, not intractable, without status migrainosus: Secondary | ICD-10-CM | POA: Insufficient documentation

## 2022-07-21 DIAGNOSIS — G43919 Migraine, unspecified, intractable, without status migrainosus: Secondary | ICD-10-CM | POA: Diagnosis present

## 2022-07-21 LAB — CBC WITH DIFF, BLOOD
ANC-Automated: 5.6 10*3/uL (ref 1.6–7.0)
Abs Basophils: 0 10*3/uL (ref <0.2)
Abs Eosinophils: 0.3 10*3/uL (ref 0.0–0.5)
Abs Lymphs: 2.6 10*3/uL (ref 0.8–3.1)
Abs Monos: 0.6 10*3/uL (ref 0.2–0.8)
Basophils: 0 %
Eosinophils: 3 %
Hct: 43.9 % (ref 34.0–45.0)
Hgb: 14.7 gm/dL (ref 11.2–15.7)
Lymphocytes: 29 %
MCH: 30.4 pg (ref 26.0–32.0)
MCHC: 33.5 g/dL (ref 32.0–36.0)
MCV: 90.7 um3 (ref 79.0–95.0)
MPV: 11.4 fL (ref 9.4–12.4)
Monocytes: 6 %
Plt Count: 265 10*3/uL (ref 140–370)
RBC: 4.84 10*6/uL (ref 3.90–5.20)
RDW: 12.3 % (ref 12.0–14.0)
Segs: 61 %
WBC: 9.2 10*3/uL (ref 4.0–10.0)

## 2022-07-21 LAB — VENOUS BLOOD GAS
BE, Ven: 0.3 mmol/L (ref <=2.3)
FIO2, Ven: 21 %
HCO3, Ven: 25 mmol/L (ref 23–28)
O2 Sat, Ven (Est): 89.9 %
Temp, Ven: 36.6 'C
pCO2, Ven (T): 41 mmHg (ref 40–52)
pCO2, Ven (Uncorr): 42 mmHg (ref 40–52)
pH, Ven (T): 7.4 (ref 7.33–7.40)
pH, Ven (Uncorr): 7.39 (ref 7.33–7.40)
pO2, Ven (T): 57 mmHg — ABNORMAL HIGH (ref 25–44)
pO2, Ven (Uncorr): 59 mmHg — ABNORMAL HIGH (ref 25–44)

## 2022-07-21 LAB — UR DRUGS OF ABUSE SCREEN
Amphetamines Screen: NEGATIVE
Barbiturates Screen: NEGATIVE
Cocaine Screen: NEGATIVE
Methadone Screen: NEGATIVE
Opiates Screen: NEGATIVE
Oxycodone Screen: NEGATIVE
Phencyclidine Screen: NEGATIVE
THC Screen: NEGATIVE
UR Benzodiazepines High Sensitivity Screen: NEGATIVE
UR Fentanyl Screen: NEGATIVE

## 2022-07-21 LAB — COMPREHENSIVE METABOLIC PANEL, BLOOD
ALT (SGPT): 26 U/L (ref 0–33)
AST (SGOT): 19 U/L (ref 0–32)
Albumin: 4.7 g/dL (ref 3.5–5.2)
Alkaline Phos: 156 U/L — ABNORMAL HIGH (ref 40–130)
Anion Gap: 12 mmol/L (ref 7–15)
BUN: 12 mg/dL (ref 6–20)
Bicarbonate: 27 mmol/L (ref 22–29)
Bilirubin, Tot: 0.16 mg/dL (ref <1.2)
Calcium: 9.9 mg/dL (ref 8.5–10.6)
Chloride: 102 mmol/L (ref 98–107)
Creatinine: 0.79 mg/dL (ref 0.51–0.95)
Glucose: 85 mg/dL (ref 70–99)
Potassium: 4.1 mmol/L (ref 3.5–5.1)
Sodium: 141 mmol/L (ref 136–145)
Total Protein: 8 g/dL (ref 6.0–8.0)
eGFR Based on CKD-EPI 2021 Equation: >60 mL/min/{1.73_m2}

## 2022-07-21 LAB — VBG+O2HBV+O2S+O2CNV
BE, Ven: 3 mmol/L — ABNORMAL HIGH (ref <=2.3)
FIO2, Ven: 21 %
HCO3, Ven: 26 mmol/L (ref 23–28)
Hct (Est), Ven: 45 % (ref 36–46)
Hgb, Ven: 14.9 g/dL (ref 12.0–16.0)
O2 Content, Ven: 10.4 vol %
O2 Hgb, Ven: 49.8 % (ref 40.0–70.0)
O2 Sat, Ven: 51.1 %
Temp, Ven: 36.8 'C
pCO2, Ven (T): 51 mmHg (ref 40–52)
pCO2, Ven (Uncorr): 51 mmHg (ref 40–52)
pH, Ven (T): 7.37 (ref 7.33–7.40)
pH, Ven (Uncorr): 7.37 (ref 7.33–7.40)
pO2, Ven (T): 29 mmHg (ref 25–44)
pO2, Ven (Uncorr): 29 mmHg (ref 25–44)

## 2022-07-21 LAB — BASIC METABOLIC PANEL, BLOOD
Anion Gap: 12 mmol/L (ref 7–15)
BUN: 12 mg/dL (ref 6–20)
Bicarbonate: 25 mmol/L (ref 22–29)
Calcium: 11.4 mg/dL — ABNORMAL HIGH (ref 8.5–10.6)
Chloride: 106 mmol/L (ref 98–107)
Creatinine: 0.8 mg/dL (ref 0.51–0.95)
Glucose: 93 mg/dL (ref 70–99)
Potassium: 4.2 mmol/L (ref 3.5–5.1)
Sodium: 143 mmol/L (ref 136–145)
eGFR Based on CKD-EPI 2021 Equation: >60 mL/min/{1.73_m2}

## 2022-07-21 LAB — ACETAMINOPHEN, BLOOD: Acetaminophen: <5 ug/mL — ABNORMAL LOW (ref 10–30)

## 2022-07-21 LAB — TROPONIN T GEN 5 W/REFLEX TO CK/CKMB
Troponin T Gen 5 w/Reflex CK/CKMB: <6 ng/L (ref <14)
Troponin T Gen 5: <6 ng/L (ref <14)
Troponin T Gen 5: <6 ng/L (ref <14)

## 2022-07-21 LAB — HCV ANTIBODY WITH REFLEX QUANT: Hepatitis C Ab: NONREACTIVE

## 2022-07-21 LAB — SODIUM, VENOUS BLOOD GAS: Sodium, Ven: 134 mmol/L — ABNORMAL LOW (ref 135–145)

## 2022-07-21 LAB — CALCIUM, IONIZED BLOOD: Ca, Ionized: 1.49 mMol/L — ABNORMAL HIGH (ref 1.13–1.32)

## 2022-07-21 LAB — SALICYLATES, SERUM: Salicylates: <1 mg/dL (ref 15–30)

## 2022-07-21 LAB — GLUCOSE, VENOUS BLOOD GAS: Glucose, Ven: 92 mg/dL (ref 65–110)

## 2022-07-21 LAB — POTASSIUM, VENOUS BLOOD GAS: Potassium, Ven: 4.3 mmol/L (ref 3.5–5.0)

## 2022-07-21 MED ORDER — GLUCOSE 4 GM PO CHEW (CUSTOM)
4.0000 | CHEWABLE_TABLET | ORAL | Status: DC | PRN
Start: 2022-07-21 — End: 2022-07-24

## 2022-07-21 MED ORDER — ONDANSETRON HCL 4 MG/2ML IV SOLN
4.0000 mg | Freq: Once | INTRAMUSCULAR | Status: AC
Start: 2022-07-21 — End: 2022-07-21
  Administered 2022-07-21: 4 mg via INTRAVENOUS
  Filled 2022-07-21: qty 2

## 2022-07-21 MED ORDER — SODIUM CHLORIDE 0.9 % IV SOLN
Freq: Once | INTRAVENOUS | Status: AC
Start: 2022-07-21 — End: 2022-07-21

## 2022-07-21 MED ORDER — CHARCOAL ACTIVATED OR LIQD
50.0000 g | Freq: Once | ORAL | Status: AC
Start: 2022-07-21 — End: 2022-07-22

## 2022-07-21 MED ORDER — DEXTROSE 50 % IV SOLN
50.0000 g | Freq: Once | INTRAVENOUS | Status: AC
Start: 2022-07-22 — End: 2022-07-22
  Administered 2022-07-22: 50 g via INTRAVENOUS
  Filled 2022-07-21: qty 50
  Filled 2022-07-21: qty 100

## 2022-07-21 MED ORDER — CALCIUM GLUCONATE 10 % IV SOLN
2000.0000 mg | Freq: Once | INTRAVENOUS | Status: DC
Start: 2022-07-21 — End: 2022-07-21

## 2022-07-21 MED ORDER — SODIUM CHLORIDE 0.9 % IV SOLN
0.0000 ug/kg/min | INTRAVENOUS | Status: DC
Start: 2022-07-21 — End: 2022-07-22
  Administered 2022-07-21: 0.06 ug/kg/min via INTRAVENOUS
  Administered 2022-07-21: 0.1 ug/kg/min via INTRAVENOUS
  Administered 2022-07-21: 0.01 ug/kg/min via INTRAVENOUS
  Administered 2022-07-22: 0.02 ug/kg/min via INTRAVENOUS
  Administered 2022-07-22: 0.03 ug/kg/min via INTRAVENOUS
  Administered 2022-07-22: 0.01 ug/kg/min via INTRAVENOUS
  Administered 2022-07-22: 0.03 ug/kg/min via INTRAVENOUS
  Administered 2022-07-22: 0.05 ug/kg/min via INTRAVENOUS
  Administered 2022-07-22: 0.04 ug/kg/min via INTRAVENOUS
  Filled 2022-07-21: qty 8

## 2022-07-21 MED ORDER — SODIUM CHLORIDE 0.9 % IV SOLN
45.0000 [IU]/h | INTRAVENOUS | Status: DC
Start: 2022-07-21 — End: 2022-07-22
  Administered 2022-07-22 (×4): 45 [IU]/h via INTRAVENOUS
  Filled 2022-07-21 (×4): qty 250

## 2022-07-21 MED ORDER — CALCIUM GLUCONATE 10 % IV SOLN
2000.0000 mg | Freq: Once | INTRAVENOUS | Status: AC
Start: 2022-07-21 — End: 2022-07-21
  Administered 2022-07-21: 2000 mg via INTRAVENOUS
  Filled 2022-07-21: qty 20

## 2022-07-21 MED ORDER — INSULIN REGULAR IV BOLUS FROM BAG
93.0000 [IU] | Freq: Once | INTRAMUSCULAR | Status: AC
Start: 2022-07-21 — End: 2022-07-22
  Administered 2022-07-22: 93 [IU] via INTRAVENOUS

## 2022-07-21 MED ORDER — DEXTROSE 50 % IV SOLN
12.5000 g | INTRAVENOUS | Status: DC | PRN
Start: 2022-07-21 — End: 2022-07-24
  Administered 2022-07-22 (×3): 12.5 g via INTRAVENOUS
  Filled 2022-07-21 (×2): qty 50

## 2022-07-21 MED ORDER — METOCLOPRAMIDE HCL 5 MG/ML IJ SOLN
10.0000 mg | Freq: Once | INTRAMUSCULAR | Status: AC
Start: 2022-07-21 — End: 2022-07-21
  Administered 2022-07-21: 10 mg via INTRAVENOUS
  Filled 2022-07-21: qty 2

## 2022-07-21 MED ORDER — DEXTROSE 10 % IV SOLN
INTRAVENOUS | Status: DC
Start: 2022-07-22 — End: 2022-07-22

## 2022-07-21 MED ORDER — GLUCAGON HCL 1 MG IJ SOLR
1.0000 mg | Freq: Once | INTRAMUSCULAR | Status: DC | PRN
Start: 2022-07-21 — End: 2022-07-24

## 2022-07-21 MED ORDER — CALCIUM GLUCONATE-NACL 2-0.675 GM/100ML-% IV SOLN
2.0000 g | Freq: Once | INTRAVENOUS | Status: AC
Start: 2022-07-21 — End: 2022-07-21
  Administered 2022-07-21: 2 g via INTRAVENOUS
  Filled 2022-07-21: qty 100

## 2022-07-21 MED ORDER — DEXTROSE (DIABETIC USE) 40 % OR GEL
1.0000 | ORAL | Status: DC | PRN
Start: 2022-07-21 — End: 2022-07-24

## 2022-07-21 NOTE — ED Provider Notes (Signed)
 ED Provider Note  [facility] electronic medical record reviewed for pertinent medical history.     Gloris Manchester DOB: [birth year] PMD: Aletta Edouard     Chief Complaint   Patient presents with   . Overdose-intentional     [facility] per EMS patient texted her friend and stated "call 911 I took a whole bottle of pills" Only medication on scene was robitussin. Patient not answering questions at this time        HPI: Kenlee Vogt is a 27 year old female who has a past medical history of Depression, Migraine, Spinal lordosis, and Tachycardia. , PO tas status post pacemaker in 2018 who presents to the emergency department after overdose.  Patient had texted friend saying that she was about to take a bottle of pills and to call 911 for her.  On arrival, paramedics found the patient lying down, minimally responsive.  Patient's vitals were normal at the time.  Medications on-site Robitussin.  Patient's husband states that they also have Tylenol and ibuprofen and verapamil that patient takes at home.  Patient is minimally cooperative with history taking.  However is arousable to sternal rub and closes her eyes tightly.  State that she took maybe 12-15 tablets of verapamil.  Per chart review, patient takes verapamil 180 mg extended release.  Patient states that she was feeling sad however does not he was attempting to take her own life or harm herself.  Patient states "she was trying to sleep."  Patient denies any chest pain, shortness of breath.  She does endorse lightheadedness.  No abdominal pain, nausea, vomiting, urinary issues, bowel issues.  No fevers/ chills. No medications were given EN route.             External Data Sources (Select all that apply):      Pertinent Medical History:    PMHx: As above    Past Surgical History:   Procedure Laterality Date   . CARDIAC DEFIBRILLATOR PLACEMENT  09/2017       Family History   Problem Relation Name Age of Onset   . Cholesterol/Lipid Disorder Mother     .  Heart Disease Mother         Current Outpatient Medications   Medication Instructions   . Ajovy 225 mg, Subcutaneous, EVERY 30 DAYS   . dexAMETHasone (DECADRON) 4 MG tablet Take 3 tabs every morning x 3 days, 2 tabs every morning x 2 days, then 1 tab in the morning x 1 day   . docusate sodium (COLACE) 250 mg, Oral, 2 TIMES DAILY   . eptinezumab-jjmr (VYEPTI) 100 mg, IntraVENOUS, EVERY 3 MONTHS   . escitalopram (LEXAPRO) 10 MG tablet No dose, route, or frequency recorded.   Marland Kitchen lasmiditan (REYVOW) 100 mg, Oral, PRN   . Magnesium Oxide 500 MG CAPS TAKE 1 CAPSULE BY MOUTH EVERY DAY   . MOTEGRITY 2 MG TABS TAKE 1 TABLET (2 MG) BY MOUTH DAILY   . nadolol (CORGARD) 20 MG tablet No dose, route, or frequency recorded.   Marland Kitchen omeprazole (PRILOSEC) 20 MG capsule No dose, route, or frequency recorded.   . rimegepant (NURTEC) tablet Take 1 tablet as needed for migraine. No more than 1 tablet per 24 hrs   . verapamil (CALAN SR) 180 MG Controlled-Release tablet No dose, route, or frequency recorded.       Physical Exam  There were no vitals taken for this visit.  Physical Exam  Constitutional:       General:  She is not in acute distress.     Appearance: She is not ill-appearing or diaphoretic.   HENT:      Head: Normocephalic.      Right Ear: Tympanic membrane normal.      Left Ear: Tympanic membrane normal.      Nose: Nose normal.      Mouth/Throat:      Mouth: Mucous membranes are moist.   Eyes:      Extraocular Movements: Extraocular movements intact.      Pupils: Pupils are equal, round, and reactive to light.   Cardiovascular:      Rate and Rhythm: Normal rate and regular rhythm.      Pulses: Normal pulses.      Comments: No murmurs heard.  Pacemaker palpated over the anterior chest  Pulmonary:      Effort: Pulmonary effort is normal.      Breath sounds: Normal breath sounds. No wheezing.      Comments: Clear to auscultation bilaterally.  No crackles or wheezing heard  Abdominal:      General: Abdomen is flat. There is no  distension.      Palpations: Abdomen is soft.      Tenderness: There is no abdominal tenderness. There is no guarding or rebound.      Comments: Nontender to palpation.  No rebound or rigidity.   Musculoskeletal:         General: Normal range of motion.      Cervical back: Normal range of motion. No tenderness.      Right lower leg: No edema.      Left lower leg: No edema.   Skin:     General: Skin is warm.      Capillary Refill: Capillary refill takes less than 2 seconds.   Neurological:      General: No focal deficit present.      Mental Status: She is alert. She is disoriented.      Comments: Patient not cooperative with neurological exam at this time.  Unable to assess.   Psychiatric:      Comments: She reports feeling sad           Orders/Medications    Orders Placed This Encounter   Procedures   . CBC w/ Diff Lavender   . CMP (Comprehensive Metabolic Panel)   . Urinalysis with Culture Reflex, when indicated   . UR Drugs of Abuse Screen   . Salicylates, Serum - See Instructions   . Acetaminophen, Blood - See Instructions   . VBG + O2 Hbg + O2 Sat + O2 Content Heparin Syringe   . ECG 12 Lead       Medications - No data to display    Medical Decision Making/Assessment/Plan    This is a(n) 27 year old female who has a past medical history of Depression, Migraine, Spinal lordosis, and Tachycardia. , PTOT S status post pacemaker who presents for overdose of unknown medication.  Patient states that she took a verapamil, however the triage note states that there was Robitussin on scene.  Unclear exact origin of medication.  Patient hemodynamically stable.  Physical exam prominent for somnolent individual, responsive to sternal rub and conversant.  PERRL, no evidence of mydriasis.  Initial vitals without tachycardia, hypotension, fever.    As patient's ingestion is unclear, differential is broad.  Given verapamil mechanism of action as calcium channel blocker, we will place the patient on monitor and evaluate vitals  and EKG with troponins.  We will evaluate salicylate and acetaminophen levels and urine drug screen as well.  If patient took Robitussin, treatment is largely supportive care and can not explain patient's somnolence at this time.  We will evaluate for coingestants.  We will check patient's electrolytes.  Considering altered mental status differential including electrolyte derangements, ACS.  As patient with unclear, we will place patient on a Level 2 and obtain psych consult once medically cleared.    During patient's ED stay, patient became persistently hypotensive to low 90s over 60s, culminating into low 70s over upper 40s.  Patient's rhythm strip transitioned from normal sinus to ventricular paced rhythm. Patient's mentation during this time was largely preserved and was communicating as appropriate.  Bedside ultrasound at this time showed preserved contractility of the heart with normal ejection fraction.  Patient's IVC was non collapsible after receiving 1 L of fluid.  At this time, Patient became diaphoretic, cold/clammy. Etiology of shock likely 2/2 acute calcium channel blocker toxicity from Verapamil. After consultation with Toxicology, recommended administering IV calcium gluconate for stabilization purposes.  Patient ended up requiring more than 6 g of calcium with minimal improvements to hemodynamic status. Patient was started on epinephrine infusion as well as high dose insulin to treat verapamil calcium channel blocker toxicity.  Patient was ultimately admitted to the ICU for continued management.    Workup:  CBC, CMP  POC Glucose  EKG, Troponin  Salicylate level  Acetaminophen level  UA, Urine Drug Screen  Level 2 + Psych Consult  High Dose Insulin  Epinephrine Infusion  VBG        ED Course/Updates/Disposition  ED Course as of 07/22/22 0213   [facility] Documentation   Fri Jul 21, 2022   1647 Patient admits to taking 12 pills of Verapimil which she takes chronically for headaches.     1651 Chart review - verapimil 180mg  extended release tabs on home meds.    1708 Charcoal + monitor   Consult Toxicology   [phone] Paged Toxicology   1731 Tox initial recs: If Unstable --> IV Calcium, high dose insulin therapy, epressors, IVF. Do bedside echo to ensure ok contractility.     In the acute state - patient states she took her meds between 2 and 2:30. Recommend observing for acute peak onset 6 hrs after - at 9PM. If stable w/o symptoms, likely medically cleared. However, recommend ED obs until AM with psych eval in the AM.   1847 Troponin T Gen 5: <6  Doubt acute ACS.    1847 CMP (Comprehensive Metabolic Panel)(!)  CMP w/o electrolyte changes. No AKI. No transaminitis.    1848 Acetaminophen(!): <5  Doubt acute acetaminophen toxicity   1848 CBC w/ Diff Lavender  CBC w/o leukocytosis. Negative anemia.    1848 Salicylates: <1.0  Normal aspirin level. Doubt acute toxicity.    1848 VBG + O2 Hbg + O2 Sat + O2 Content Heparin Syringe(!)  VBG wnl - no metabolic/CO2 acidosis.    2107 UR Drugs of Abuse Screen  Urine Drug screen negative.    2107 Troponin T Gen 5: <6  Doubt acute ACS.    2107 Hepatitis C Ab: Non Reactive   [address], Ionized(!): 1.49  Supports verapimil overdose   2327 Paged ICU for admission. Patient s/p 3g Calcium gluconate. Persistently hypotensive so will start Epi and High dose insulin.    2336 ICU agreed to come eval patient   2336 Glucose, Ven: 92   2336 Potassium, Ven: 4.3  2336 Sodium, Ven(!): 134   2336 Venous Blood Gas Heparin Syringe(!)  VBG similar to prior   2355 Basic Metabolic Panel, Blood Green Plasma Separator Tube(!)  BMP w/ hypercalcemia to 11.4 after 6g of Calcium gluconate. No e/o hyperglycemia at this time   2356 Troponin T Gen 5: <6   Sat Jul 22, 2022   Bosie Helper Authorization #[id number]   0116 Urinalysis with Culture Reflex, when indicated(!)  UA without UTI      Others' Documentation   Fri Jul 21, 2022   2053 Blood pressure (BP)(!): 90/48 [AA]   Sat Jul 22, 2022   0031  S/O: 27 yo with sp suicide attempt by ingesting verapamil XR, received calcium boluses and now on epi gtt. Admitted to ICU. [CC]      ED Course User Index  [AA] Delman Kitten, MD  [CC] Farley Ly, MD         ED Clinical Impression as of 07/22/22 [phone]   Calcium channel blocker overdose, intentional self-harm, initial encounter (CMS-HCC)   Hypotension, unspecified hypotension type                      Data Reviewed:        Risk of Complications and/or Morbidity:               Octavio Manns, MD  Resident  07/22/22 0210       Sharlene Dory, MD  07/26/22 1436

## 2022-07-21 NOTE — ED Notes (Signed)
BGL- 95

## 2022-07-21 NOTE — ED Notes (Signed)
Bed: 16A  Expected date:   Expected time:   Means of arrival:   Comments:  Kristen Olson

## 2022-07-21 NOTE — EMS Narrative (Addendum)
EMS note for [id number] by M07, last updated at 21:50 on 2022-07-23  [place] Department, M07     1900-05-29     26 Years     79.4   kg                      Patient's symptom(s):  R41.82 - Altered mental status, unspecified                      Paramedic's impression(s):  F19 - Other psychoactive substance abuse, uncomplicated                      OD. Patient sent text to friend at 14:29 saying " I took a bottle of pills   call 911" unknown pills or why. Friend then activated 911 for her. Patient   is found laying in bed " unconscious." Hand is lifted over head and falls   laterally. Patient remains non verbal and is eventually awoken by SDFD and   is " confused with garbled speech." Which maybe by choice as eye lids   continue to flutter. Secondary is unremarkable. Extricate out of house to   gurney for further evaluation at [place]. Baseline VS along with BGA and temp   are done. Patient status remains the same to [place] where we hold the wall.                       Paramedic Physical Exam:  Mental Status Assessment: Normal Baseline for Patient  Neurological Assessment: Normal Baseline for Patient                      Paramedic Procedures:  14:51 - 3-Lead ECG Monitored: Yes                      EMS Response Details:  EMS responded at 14:35  EMS arrived at 14:43

## 2022-07-21 NOTE — ED Notes (Signed)
BGL 99

## 2022-07-21 NOTE — ED Notes (Signed)
Awaiting results of repeat BMP prior to initiation of insulin infusion.

## 2022-07-21 NOTE — ED Notes (Signed)
Assumed patient care.  Patient alert and oriented x4.  Patient speaking in full sentences. Patient skin pink, warm, and dry.  Patient presented to ED as an OD.   Patient placed on full cardiac monitor with alarms on/audible.  Paced at 66 bpm.    Patient lying on gurney, gurney in lowest position and locked.  Siderails upx2.  Call light with reach.    Husband at bedside.

## 2022-07-21 NOTE — Consults (Cosign Needed Addendum)
TOXICOLOGY CONSULT NOTE     Kristen Olson  MRN: [id number]  DOB: Feb 08, 1996  PMD: Laqueta Carina    The Date of Service for this consult is 07/21/2022  4:03 PM     CC: Overdose-intentional ([facility] per EMS patient texted her friend and stated "call 911 I took a whole bottle of pills" Only medication on scene was robitussin. Patient not answering questions at this time )    Chief Complaint   Patient presents with    Overdose-intentional     [facility] per EMS patient texted her friend and stated "call 911 I took a whole bottle of pills" Only medication on scene was robitussin. Patient not answering questions at this time        History of Present Illness    Pt seen and examined. Pt is a 27 year old female with history of depression, headaches, tachycardia, PPM (2018) who presents with ingestion.     Patient reports taking 12-15 tablets of verapamil '180mg'$  ER. Normally takes it for headaches. She reports she took it to sleep. Was then found lying down, minimally responsive, normal vital signs per EMS report. In the ED, she is conversant. Patient reports time of ingestion around 2-2:30pm. She denies co-ingestions, no ethanol, drug use. She reports nausea currently, no other focal complaints.       Past Medical/Surgical History    PMHx:   Past Medical History:   Diagnosis Date    Depression     Migraine     Spinal lordosis     Tachycardia     Hx Tachycardia, coronary artery spasms - sees cardiologist, on meds. Defibrillator placed 09/2017.     PSHx:    Past Surgical History:   Procedure Laterality Date    CARDIAC DEFIBRILLATOR PLACEMENT  09/2017       Outpatient Medications      Prior to Admission Medications   Prescriptions Last Dose Informant Patient Reported? Taking?   MOTEGRITY 2 MG TABS   No No   Sig: TAKE 1 TABLET (2 MG) BY MOUTH DAILY   Magnesium Oxide 500 MG CAPS   No No   Sig: TAKE 1 CAPSULE BY MOUTH EVERY DAY   dexAMETHasone (DECADRON) 4 MG tablet   No No   Sig: Take 3 tabs every morning x 3 days, 2 tabs  every morning x 2 days, then 1 tab in the morning x 1 day   docusate sodium (COLACE) 250 MG capsule   No No   Sig: TAKE 1 CAPSULE (250 MG) BY MOUTH 2 TIMES DAILY   eptinezumab-jjmr (VYEPTI) 100 MG/ML SOLN   No No   Sig: Inject 1 mL (100 mg) into vein Every 3 months.   escitalopram (LEXAPRO) 10 MG tablet   Yes No   fremanezumab-vfrm (AJOVY) injection   No No   Sig: Inject 1.5 mL (225 mg) under the skin every 30 days.   lasmiditan (REYVOW) 100 MG tablet   No No   Sig: Take 1 tablet (100 mg) by mouth as needed for Migraine.   nadolol (CORGARD) 20 MG tablet   Yes No   omeprazole (PRILOSEC) 20 MG capsule   Yes No   rimegepant (NURTEC) tablet   No No   Sig: Take 1 tablet as needed for migraine. No more than 1 tablet per 24 hrs   verapamil (CALAN SR) 180 MG Controlled-Release tablet   Yes No      Facility-Administered Medications: None  What To Do With Your Medications        CONTINUE taking these medications        Add'l Info   Ajovy injection  Inject 1.5 mL (225 mg) under the skin every 30 days.  Generic drug: fremanezumab-vfrm   Quantity: 4.5 mL  Refills: 3     dexAMETHasone 4 MG tablet  Commonly known as: DECADRON  Take 3 tabs every morning x 3 days, 2 tabs every morning x 2 days, then 1 tab in the morning x 1 day   Quantity: 14 tablet  Refills: 0     docusate sodium 250 MG capsule  Commonly known as: COLACE  TAKE 1 CAPSULE (250 MG) BY MOUTH 2 TIMES DAILY   Quantity: 60 capsule  Refills: 2     eptinezumab-jjmr 100 MG/ML Soln  Commonly known as: VYEPTI  Inject 1 mL (100 mg) into vein Every 3 months.   Quantity: 1.12 mL  Refills: 3     escitalopram 10 MG tablet  Commonly known as: LEXAPRO   Refills: 0     lasmiditan 100 MG tablet  Commonly known as: REYVOW  Take 1 tablet (100 mg) by mouth as needed for Migraine.   Quantity: 8 tablet  Refills: 5     Magnesium Oxide -Mg Supplement 500 MG Caps  TAKE 1 CAPSULE BY MOUTH EVERY DAY   Quantity: 30 capsule  Refills: 11     Motegrity 2 MG Tabs  TAKE 1 TABLET (2 MG) BY MOUTH  DAILY  Generic drug: Prucalopride Succinate   Quantity: 30 each  Refills: 2     nadolol 20 MG tablet  Commonly known as: CORGARD   Refills: 0     Nurtec tablet  Take 1 tablet as needed for migraine. No more than 1 tablet per 24 hrs  Generic drug: rimegepant   Quantity: 8 tablet  Refills: 5     omeprazole 20 MG capsule  Commonly known as: PRILOSEC   Refills: 0     verapamil 180 MG SR tablet  Commonly known as: CALAN SR   Refills: 0              Allergies    Gluten meal    Social History       Social History     Socioeconomic History    Marital status: Married   Tobacco Use    Smoking status: Never    Smokeless tobacco: Never   Substance and Sexual Activity    Alcohol use: Not Currently    Drug use: Never    Sexual activity: Yes     Partners: Male     Birth control/protection: Pill       Family History     family history includes Cholesterol/Lipid Disorder in her mother; Heart Disease in her mother.    Review of Systems    Constitutional: No fatigue, malaise, fever  Eyes: No eye pain, vision impairment  ENT: No sore throat, congestion  CV: No chest pain, lightheadedness  Resp: No cough, shortness of breath  GI: +nausea, no vomiting, diarrhea, abdominal pain  GU: No dysuria, increased frequency, increased urgency, hematuria, difficulty urinating.  Integumentary: No rashes, lesions  Neuro: No focal extremity weakness, sensory changes, difficulty speaking, gait disturbance    Physical Exam    There were no vitals filed for this visit.  Vital signs reviewed.  General: Well developed, well nourished, no acute distress.   Eyes: No conjunctival injection. No scleral icterus. Pupils mid ranged  and reactive  ENT: No facial swelling. Moist mucus membranes.  Neck: Trachea midline. No lymphadenopathy.   Lungs: Symmetric breath rise  Cardiac: Regular rate and rhythm.   Abdominal: Soft, nontender, nondistended.   Extremity: Warm, well perfused. No deformity, edema.   Skin: No rashes, lesions, ecchymosis, petechiae  Neuro: Aox3,  follows commands. 5/5 strength in BUE and BLE. No tremors, no clonus.       RESULTS     EKG, interpretation:  07/21/2022 1650 NSR HR 64 QRS 102 Qtc 412 non specific TW changes      Imaging    No results found.    Labs      Results for orders placed or performed during the hospital encounter of 07/21/22   CBC w/ Diff Lavender   Result Value Ref Range    WBC 9.2 4.0 - 10.0 1000/mm3    RBC 4.84 3.90 - 5.20 mill/mm3    Hgb 14.7 11.2 - 15.7 gm/dL    Hct 43.9 34.0 - 45.0 %    MCV 90.7 79.0 - 95.0 um3    MCH 30.4 26.0 - 32.0 pgm    MCHC 33.5 32.0 - 36.0 g/dL    RDW 12.3 12.0 - 14.0 %    MPV 11.4 9.4 - 12.4 fL    Plt Count 265 140 - 370 1000/mm3    Segs 61 %    Lymphocytes 29 %    Monocytes 6 %    Eosinophils 3 %    Basophils 0 %    ANC-Automated 5.6 1.6 - 7.0 1000/mm3    Abs Lymphs 2.6 0.8 - 3.1 1000/mm3    Abs Monos 0.6 0.2 - 0.8 1000/mm3    Abs Eosinophils 0.3 0.0 - 0.5 1000/mm3    Abs Basophils 0.0 <0.2 1000/mm3    Diff Type Automated    CMP (Comprehensive Metabolic Panel)   Result Value Ref Range    Glucose 85 70 - 99 mg/dL    BUN 12 6 - 20 mg/dL    Creatinine 0.79 0.51 - 0.95 mg/dL    eGFR Based on CKD-EPI 2021 Equation >60 mL/min/1.73 m2    Sodium 141 136 - 145 mmol/L    Potassium 4.1 3.5 - 5.1 mmol/L    Chloride 102 98 - 107 mmol/L    Bicarbonate 27 22 - 29 mmol/L    Anion Gap 12 7 - 15 mmol/L    Calcium 9.9 8.5 - 10.6 mg/dL    Total Protein 8.0 6.0 - 8.0 g/dL    Albumin 4.7 3.5 - 5.2 g/dL    Bilirubin, Tot 0.16 <1.2 mg/dL    AST (SGOT) 19 0 - 32 U/L    ALT (SGPT) 26 0 - 33 U/L    Alkaline Phos 156 (H) 40 - [id number] U/L   Salicylates, Serum - See Instructions   Result Value Ref Range    Salicylates [id number] 15 - 30 mg/dL   Acetaminophen, Blood - See Instructions   Result Value Ref Range    Acetaminophen <5 (L) 10 - 30 mcg/mL   VBG + O2 Hbg + O2 Sat + O2 Content Heparin Syringe   Result Value Ref Range    Ven Coll Site Venous     Temp, Ven 36.8 'C    FIO2, Ven 21.0 %    pH, Ven (T) 7.37 7.33 - 7.40    pCO2, Ven (T) 51 40 - 52  mmHg    pO2, Ven (T) 29 25 - 44 mmHg  HCO3, Ven 26 23 - 28 mmol/L    BE, Ven 3.0 (H) -2.4 - 2.3 mmol/L    O2 Sat, Ven 51.1 %    O2 Hgb, Ven 49.8 40.0 - 70.0 %    O2 Content, Ven 10.4 vol %    pH, Ven (Uncorr) 7.37 7.33 - 7.40    pCO2, Ven (Uncorr) 51 40 - 52 mmHg    pO2, Ven (Uncorr) 29 25 - 44 mmHg    Hgb, Ven 14.9 12.0 - 16.0 g/dL    Hct (Est), Ven 45 36 - 46 %       All lab results reviewed. Pertinent findings discussed with the patient. She understands the findings.    MDM / Impression / Plan:      Pt is a 27 year old yo female with history of depression, headaches, tachycardia, PPM (2018) who presents with c/f verapamil ingestion.     Patient's initial VS were normal but several hours into her ED course, she became hypotensive. Her systolics were in the 70-90s and HR was 60. She does have a pacemaker and her EKG showed a V-paced rhythm. I suspect she will not have a lower heart rate given the pacemaker rate. Exam notable for well appearing, alert, following commands, abd benign, RRR. Given her verapamil appears to be ER, time to peak concentration is 11 hrs which is within the time when her vital signs changed. Her blood glucose are 80-90s, it is possible that she is experiencing mild toxicity currently. Often times severe toxicity would see hyperglycemia due to L-type calcium channel inhibition in the pancreas. Her APAP and salicylate levels are undetectable. Her initial somnolence could signify other ingestions such as antihistamines (though no other signs of anticholinergic toxicity, EKG also narrow QRS (less likely diphenhydramine), sedative/hypnotics (though has woken up without interventions), opioids (does not have opioid overdose toxidrome). Bedside echo showed normal IVC and normal EF, again she could be vasodilating or having signs of mild CCB toxicity.     There are two types of CCB. Non-dihydropyridines (eg. Verapamil, diltiazem) and dihydropyridines (eg. Nifedipine, amlodipine, nimodipine).  Non-DHPs tend to have more cardiac affect opposed to DHPs which have primarily vasodilatory effects. However, at large overdoses, DHPs can lose their selectivity and also cause myocardial suppression. Calcium is used to overcome the calcium channel blockade. Pressors can help with hemodynamics but at the same time high-dose insulin euglycemia therapy should be started as well. High-dose insulin euglycemia therapy (HIET) works best for myocardial dysfunction as it helps the myocytes utilize carbohydrates more efficiently to help increase inotropy and shock state. If patient's hemodynamic instability remains refractory to maximal therapy of the above mentioned treatments, intra-lipid emulsion and methylene blue could be considered but evidence for their effectiveness is limited.     Recommendations:  - IV calcium administration (either calcium gluconate 2g or calcium chloride 1g if good line or through CVC), target calcium level 15-17 or ical 2mM  --- repeat doses can be given every 1-2 hours  - Vasopressor support as needed  - Start high dose insulin euglycemic therapy (HIET) (1u/kg bolus followed by 1u/kg/hr infusion), administer D50W unless glucose >250 mg/dL  --- monitor potassium, magnesium, and phosphate    - Target hemodynamics BP > 90 or MAP >65, HR > 60 (patient won't go below this due to pacemaker)    Case discussed with Dr. Milus Mallick.   ___________________________________  Shan Levans, MD  Medical Toxicology Fellow  [facility] Division of Toxicology  Pager [phone]

## 2022-07-22 ENCOUNTER — Other Ambulatory Visit (HOSPITAL_BASED_OUTPATIENT_CLINIC_OR_DEPARTMENT_OTHER): Payer: Self-pay | Admitting: Pharmacist

## 2022-07-22 DIAGNOSIS — T461X2A Poisoning by calcium-channel blockers, intentional self-harm, initial encounter: Secondary | ICD-10-CM

## 2022-07-22 LAB — GLUCOSE (POCT)
Glucose (POCT): 107 mg/dL — ABNORMAL HIGH (ref 70–99)
Glucose (POCT): 107 mg/dL — ABNORMAL HIGH (ref 70–99)
Glucose (POCT): 117 mg/dL — ABNORMAL HIGH (ref 70–99)
Glucose (POCT): 125 mg/dL — ABNORMAL HIGH (ref 70–99)
Glucose (POCT): 151 mg/dL — ABNORMAL HIGH (ref 70–99)
Glucose (POCT): 180 mg/dL — ABNORMAL HIGH (ref 70–99)
Glucose (POCT): 228 mg/dL — ABNORMAL HIGH (ref 70–99)
Glucose (POCT): 230 mg/dL — ABNORMAL HIGH (ref 70–99)
Glucose (POCT): 233 mg/dL — ABNORMAL HIGH (ref 70–99)
Glucose (POCT): 242 mg/dL — ABNORMAL HIGH (ref 70–99)
Glucose (POCT): 244 mg/dL — ABNORMAL HIGH (ref 70–99)
Glucose (POCT): 247 mg/dL — ABNORMAL HIGH (ref 70–99)
Glucose (POCT): 256 mg/dL — ABNORMAL HIGH (ref 70–99)
Glucose (POCT): 296 mg/dL — ABNORMAL HIGH (ref 70–99)
Glucose (POCT): 296 mg/dL — ABNORMAL HIGH (ref 70–99)
Glucose (POCT): 356 mg/dL — ABNORMAL HIGH (ref 70–99)
Glucose (POCT): 376 mg/dL — ABNORMAL HIGH (ref 70–99)
Glucose (POCT): 422 mg/dL — ABNORMAL HIGH (ref 70–99)
Glucose (POCT): 440 mg/dL — ABNORMAL HIGH (ref 70–99)
Glucose (POCT): 59 mg/dL — CL (ref 70–99)
Glucose (POCT): 64 mg/dL — ABNORMAL LOW (ref 70–99)
Glucose (POCT): 68 mg/dL — ABNORMAL LOW (ref 70–99)
Glucose (POCT): 82 mg/dL (ref 70–99)
Glucose (POCT): 84 mg/dL (ref 70–99)
Glucose (POCT): 89 mg/dL (ref 70–99)
Glucose (POCT): 95 mg/dL (ref 70–99)
Glucose (POCT): 99 mg/dL (ref 70–99)
Glucose (POCT): >600 mg/dL (ref 70–99)

## 2022-07-22 LAB — ECG 12-LEAD
ATRIAL RATE: 64 {beats}/min
ATRIAL RATE: 63 {beats}/min
ATRIAL RATE: 44 {beats}/min
ATRIAL RATE: 60 {beats}/min
ECG INTERPRETATION: NORMAL
P AXIS: 53 degrees
PR INTERVAL: 204 ms
PR INTERVAL: 464 ms
QRS INTERVAL/DURATION: 102 ms
QRS INTERVAL/DURATION: 190 ms
QRS INTERVAL/DURATION: 186 ms
QRS INTERVAL/DURATION: 202 ms
QT: 400 ms
QT: 498 ms
QT: 504 ms
QT: 504 ms
QTc (Bazett): 412 ms
QTc (Bazett): 498 ms
QTc (Bazett): 504 ms
QTc (Bazett): 504 ms
QTc (Fredericia): 408 ms
QTc (Fredericia): 498 ms
QTc (Fredericia): 504 ms
QTc (Fredericia): 504 ms
R AXIS: 70 degrees
R AXIS: -80 degrees
R AXIS: -72 degrees
R AXIS: -69 degrees
T AXIS: -15 degrees
T AXIS: 78 degrees
T AXIS: 89 degrees
T AXIS: 81 degrees
VENTRICULAR RATE: 64 {beats}/min
VENTRICULAR RATE: 60 {beats}/min
VENTRICULAR RATE: 60 {beats}/min
VENTRICULAR RATE: 60 {beats}/min

## 2022-07-22 LAB — BASIC METABOLIC PANEL, BLOOD
Anion Gap: 16 mmol/L — ABNORMAL HIGH (ref 7–15)
Anion Gap: 15 mmol/L (ref 7–15)
Anion Gap: 11 mmol/L (ref 7–15)
Anion Gap: 15 mmol/L (ref 7–15)
Anion Gap: 10 mmol/L (ref 7–15)
Anion Gap: 10 mmol/L (ref 7–15)
BUN: 10 mg/dL (ref 6–20)
BUN: 9 mg/dL (ref 6–20)
BUN: 6 mg/dL (ref 6–20)
BUN: 5 mg/dL — ABNORMAL LOW (ref 6–20)
BUN: 3 mg/dL — ABNORMAL LOW (ref 6–20)
BUN: 3 mg/dL — ABNORMAL LOW (ref 6–20)
Bicarbonate: 18 mmol/L — ABNORMAL LOW (ref 22–29)
Bicarbonate: 21 mmol/L — ABNORMAL LOW (ref 22–29)
Bicarbonate: 24 mmol/L (ref 22–29)
Bicarbonate: 19 mmol/L — ABNORMAL LOW (ref 22–29)
Bicarbonate: 23 mmol/L (ref 22–29)
Bicarbonate: 25 mmol/L (ref 22–29)
Calcium: 9.1 mg/dL (ref 8.5–10.6)
Calcium: 13.8 mg/dL (ref 8.5–10.6)
Calcium: 10.2 mg/dL (ref 8.5–10.6)
Calcium: 10.1 mg/dL (ref 8.5–10.6)
Calcium: 9.3 mg/dL (ref 8.5–10.6)
Calcium: 9 mg/dL (ref 8.5–10.6)
Chloride: 107 mmol/L (ref 98–107)
Chloride: 101 mmol/L (ref 98–107)
Chloride: 101 mmol/L (ref 98–107)
Chloride: 101 mmol/L (ref 98–107)
Chloride: 106 mmol/L (ref 98–107)
Chloride: 103 mmol/L (ref 98–107)
Creatinine: 0.67 mg/dL (ref 0.51–0.95)
Creatinine: 0.71 mg/dL (ref 0.51–0.95)
Creatinine: 0.73 mg/dL (ref 0.51–0.95)
Creatinine: 0.58 mg/dL (ref 0.51–0.95)
Creatinine: 0.62 mg/dL (ref 0.51–0.95)
Creatinine: 0.64 mg/dL (ref 0.51–0.95)
Glucose: 336 mg/dL — ABNORMAL HIGH (ref 70–99)
Glucose: 350 mg/dL — ABNORMAL HIGH (ref 70–99)
Glucose: 244 mg/dL — ABNORMAL HIGH (ref 70–99)
Glucose: 235 mg/dL — ABNORMAL HIGH (ref 70–99)
Glucose: 105 mg/dL — ABNORMAL HIGH (ref 70–99)
Glucose: 122 mg/dL — ABNORMAL HIGH (ref 70–99)
Potassium: 3 mmol/L — ABNORMAL LOW (ref 3.5–5.1)
Potassium: 3.7 mmol/L (ref 3.5–5.1)
Potassium: 4.8 mmol/L (ref 3.5–5.1)
Potassium: 5.1 mmol/L (ref 3.5–5.1)
Potassium: 3.7 mmol/L (ref 3.5–5.1)
Potassium: 3.9 mmol/L (ref 3.5–5.1)
Sodium: 141 mmol/L (ref 136–145)
Sodium: 137 mmol/L (ref 136–145)
Sodium: 136 mmol/L (ref 136–145)
Sodium: 135 mmol/L — ABNORMAL LOW (ref 136–145)
Sodium: 139 mmol/L (ref 136–145)
Sodium: 138 mmol/L (ref 136–145)
eGFR Based on CKD-EPI 2021 Equation: >60 mL/min/{1.73_m2}
eGFR Based on CKD-EPI 2021 Equation: >60 mL/min/{1.73_m2}
eGFR Based on CKD-EPI 2021 Equation: >60 mL/min/{1.73_m2}
eGFR Based on CKD-EPI 2021 Equation: >60 mL/min/{1.73_m2}
eGFR Based on CKD-EPI 2021 Equation: >60 mL/min/{1.73_m2}
eGFR Based on CKD-EPI 2021 Equation: >60 mL/min/{1.73_m2}

## 2022-07-22 LAB — HEMOGRAM, BLOOD
Hct: 41.5 % (ref 34.0–45.0)
Hgb: 14 gm/dL (ref 11.2–15.7)
MCH: 30.4 pg (ref 26.0–32.0)
MCHC: 33.7 g/dL (ref 32.0–36.0)
MCV: 90 um3 (ref 79.0–95.0)
MPV: 11.5 fL (ref 9.4–12.4)
Plt Count: 232 10*3/uL (ref 140–370)
RBC: 4.61 10*6/uL (ref 3.90–5.20)
RDW: 12 % (ref 12.0–14.0)
WBC: 9.7 10*3/uL (ref 4.0–10.0)

## 2022-07-22 LAB — LIVER PANEL, BLOOD
ALT (SGPT): 15 U/L (ref 0–33)
AST (SGOT): 11 U/L (ref 0–32)
Albumin: 3.1 g/dL — ABNORMAL LOW (ref 3.5–5.2)
Alkaline Phos: 97 U/L (ref 40–130)
Bilirubin, Dir: <0.2 mg/dL (ref <=0.2)
Bilirubin, Tot: 0.17 mg/dL (ref <1.2)
Total Protein: 5 g/dL — ABNORMAL LOW (ref 6.0–8.0)

## 2022-07-22 LAB — URINALYSIS WITH CULTURE REFLEX, WHEN INDICATED
Bilirubin: NEGATIVE
Blood: NEGATIVE
Glucose: NEGATIVE
Hyaline Cast: >5 — AB (ref 0–?)
Ketones: NEGATIVE
Leuk Esterase: NEGATIVE Leu/uL
Nitrite: NEGATIVE
Specific Gravity: 1.025 (ref 1.002–1.030)
Urobilinogen: NEGATIVE
pH: 5.5 (ref 5.0–8.0)

## 2022-07-22 LAB — CBC WITH DIFF, BLOOD
ANC-Automated: 5.9 10*3/uL (ref 1.6–7.0)
Abs Basophils: 0 10*3/uL (ref <0.2)
Abs Eosinophils: 0 10*3/uL (ref 0.0–0.5)
Abs Lymphs: 1.3 10*3/uL (ref 0.8–3.1)
Abs Monos: 0.5 10*3/uL (ref 0.2–0.8)
Basophils: 0 %
Eosinophils: 0 %
Hct: 31.9 % — ABNORMAL LOW (ref 34.0–45.0)
Hgb: 9.1 gm/dL — ABNORMAL LOW (ref 11.2–15.7)
Lymphocytes: 17 %
MCH: 30.5 pg (ref 26.0–32.0)
MCHC: 28.5 g/dL — ABNORMAL LOW (ref 32.0–36.0)
MCV: 107 um3 — ABNORMAL HIGH (ref 79.0–95.0)
MPV: 11.7 fL (ref 9.4–12.4)
Monocytes: 6 %
Plt Count: 187 10*3/uL (ref 140–370)
RBC: 2.98 10*6/uL — ABNORMAL LOW (ref 3.90–5.20)
RDW: 12.5 % (ref 12.0–14.0)
Segs: 77 %
WBC: 7.7 10*3/uL (ref 4.0–10.0)

## 2022-07-22 LAB — POTASSIUM, VENOUS BLOOD GAS
Potassium, Ven: 3 mmol/L — ABNORMAL LOW (ref 3.5–5.0)
Potassium, Ven: 2.9 mmol/L — ABNORMAL LOW (ref 3.5–5.0)
Potassium, Ven: 3.9 mmol/L (ref 3.5–5.0)

## 2022-07-22 LAB — CALCIUM, IONIZED BLOOD
Ca, Ionized: 1.85 mMol/L (ref 1.13–1.32)
Ca, Ionized: 1.1 mMol/L — ABNORMAL LOW (ref 1.13–1.32)
Ca, Ionized: 1.22 mMol/L (ref 1.13–1.32)
Ca, Ionized: 1.3 mMol/L (ref 1.13–1.32)

## 2022-07-22 LAB — LACTATE, BLOOD
Lactate: 2.3 mmol/L — ABNORMAL HIGH (ref 0.5–2.0)
Lactate: 3.8 mmol/L — ABNORMAL HIGH (ref 0.5–2.0)
Lactate: 3.9 mmol/L — ABNORMAL HIGH (ref 0.5–2.0)
Lactate: 2 mmol/L (ref 0.5–2.0)

## 2022-07-22 LAB — HCG QUANTITATIVE, BLOOD: HCG Qt: <1 m[IU]/mL

## 2022-07-22 LAB — GLYCOSYLATED HGB(A1C), BLOOD: Glyco Hgb (A1C): 5.6 % (ref 4.8–5.8)

## 2022-07-22 MED ORDER — INSULIN REGULAR IV BOLUS FROM BAG
0.0000 [IU] | Freq: Once | INTRAMUSCULAR | Status: DC
Start: 2022-07-22 — End: 2022-07-22

## 2022-07-22 MED ORDER — VENLAFAXINE HCL 75 MG OR CP24
225.00 mg | ORAL_CAPSULE | Freq: Every day | ORAL | Status: DC
Start: 2022-03-08 — End: 2022-11-20

## 2022-07-22 MED ORDER — ALUM & MAG HYDROXIDE-SIMETH 200-200-20 MG/5ML OR SUSP
30.0000 mL | Freq: Once | ORAL | Status: AC
Start: 2022-07-22 — End: 2022-07-22
  Administered 2022-07-22: 30 mL via ORAL
  Filled 2022-07-22: qty 30

## 2022-07-22 MED ORDER — POLYETHYLENE GLYCOL 3350 OR PACK
17.0000 g | PACK | Freq: Every day | ORAL | Status: DC | PRN
Start: 2022-07-22 — End: 2022-07-24

## 2022-07-22 MED ORDER — SODIUM CHLORIDE 0.9% TKO INFUSION
INTRAVENOUS | Status: DC | PRN
Start: 2022-07-22 — End: 2022-07-24

## 2022-07-22 MED ORDER — MONTELUKAST SODIUM 10 MG OR TABS
10.0000 mg | ORAL_TABLET | Freq: Every evening | ORAL | Status: DC
Start: 2022-07-22 — End: 2022-07-24
  Administered 2022-07-22 – 2022-07-23 (×2): 10 mg via ORAL
  Filled 2022-07-22 (×2): qty 1

## 2022-07-22 MED ORDER — ENOXAPARIN SODIUM 40 MG/0.4ML IJ SOSY
40.0000 mg | PREFILLED_SYRINGE | Freq: Every day | INTRAMUSCULAR | Status: DC
Start: 2022-07-22 — End: 2022-07-24
  Administered 2022-07-22 – 2022-07-23 (×2): 40 mg via SUBCUTANEOUS
  Filled 2022-07-22 (×2): qty 1

## 2022-07-22 MED ORDER — BUPROPION SR (BID) 150 MG OR TB12
150.0000 mg | ORAL_TABLET | Freq: Every day | ORAL | Status: DC
Start: 2022-07-22 — End: 2022-07-22
  Filled 2022-07-22: qty 1

## 2022-07-22 MED ORDER — DEXTROSE 5 % IV SOLN
INTRAVENOUS | Status: DC
Start: 2022-07-22 — End: 2022-07-22

## 2022-07-22 MED ORDER — POTASSIUM CHLORIDE 20 MEQ OR PACK
40.0000 meq | PACK | ORAL | Status: AC
Start: 2022-07-22 — End: 2022-07-22
  Administered 2022-07-22 (×2): 40 meq via ORAL
  Filled 2022-07-22 (×2): qty 2

## 2022-07-22 MED ORDER — SODIUM CHLORIDE 0.9 % IJ SOLN (CUSTOM)
3.0000 mL | Freq: Three times a day (TID) | INTRAMUSCULAR | Status: DC
Start: 2022-07-22 — End: 2022-07-24
  Administered 2022-07-22 – 2022-07-23 (×3): 3 mL via INTRAVENOUS

## 2022-07-22 MED ORDER — NORETHINDRONE (CONTRACEPTIVE) 0.35 MG OR TABS
1.0000 | ORAL_TABLET | Freq: Every day | ORAL | Status: DC
Start: 2022-07-22 — End: 2022-07-24
  Administered 2022-07-22: 1 via ORAL
  Filled 2022-07-22 (×5): qty 1

## 2022-07-22 MED ORDER — POTASSIUM CHLORIDE CRYS CR 20 MEQ OR TBCR
20.0000 meq | EXTENDED_RELEASE_TABLET | Freq: Once | ORAL | Status: AC
Start: 2022-07-22 — End: 2022-07-22
  Administered 2022-07-22: 20 meq via ORAL
  Filled 2022-07-22: qty 1

## 2022-07-22 MED ORDER — MONTELUKAST SODIUM 10 MG OR TABS
1.00 | ORAL_TABLET | Freq: Every evening | ORAL | Status: DC
Start: 2021-12-16 — End: 2023-01-05

## 2022-07-22 MED ORDER — CALCIUM GLUCONATE-NACL 1-0.675 GM/50ML-% IV SOLN
1.0000 g | Freq: Once | INTRAVENOUS | Status: AC
Start: 2022-07-22 — End: 2022-07-22
  Administered 2022-07-22: 1 g via INTRAVENOUS
  Filled 2022-07-22: qty 50

## 2022-07-22 MED ORDER — POTASSIUM CHLORIDE 20 MEQ OR PACK
40.0000 meq | PACK | ORAL | Status: DC
Start: 2022-07-22 — End: 2022-07-22
  Administered 2022-07-22: 40 meq via ORAL
  Filled 2022-07-22: qty 2

## 2022-07-22 MED ORDER — SODIUM CHLORIDE 0.9 % IV SOLN
0.0000 [IU]/h | INTRAVENOUS | Status: DC
Start: 2022-07-22 — End: 2022-07-22
  Administered 2022-07-22 (×2): 9.5 [IU]/h via INTRAVENOUS

## 2022-07-22 MED ORDER — ONDANSETRON HCL 4 MG/2ML IV SOLN
4.0000 mg | Freq: Four times a day (QID) | INTRAMUSCULAR | Status: DC | PRN
Start: 2022-07-22 — End: 2022-07-24
  Administered 2022-07-22: 4 mg via INTRAVENOUS
  Filled 2022-07-22: qty 2

## 2022-07-22 MED ORDER — DEXTROSE-NACL 5-0.45 % IV SOLN (CUSTOM)
INTRAVENOUS | Status: AC
Start: 2022-07-22 — End: 2022-07-23

## 2022-07-22 MED ORDER — SODIUM CHLORIDE 0.9 % IJ SOLN (CUSTOM)
3.0000 mL | INTRAMUSCULAR | Status: DC | PRN
Start: 2022-07-22 — End: 2022-07-24

## 2022-07-22 MED ORDER — LANSOPRAZOLE 30 MG OR CPDR
30.0000 mg | DELAYED_RELEASE_CAPSULE | Freq: Every day | ORAL | Status: DC
Start: 2022-07-22 — End: 2022-07-24
  Administered 2022-07-22 – 2022-07-23 (×2): 30 mg via ORAL
  Filled 2022-07-22 (×2): qty 1

## 2022-07-22 MED ORDER — MAGNESIUM OXIDE 400 MG OR TABS
400.0000 mg | ORAL_TABLET | Freq: Every day | ORAL | Status: DC
Start: 2022-07-22 — End: 2022-07-24
  Administered 2022-07-22 – 2022-07-23 (×2): 400 mg via ORAL
  Filled 2022-07-22 (×2): qty 1

## 2022-07-22 MED ORDER — NALOXONE HCL 0.4 MG/ML IJ SOLN
0.1000 mg | INTRAMUSCULAR | Status: DC | PRN
Start: 2022-07-22 — End: 2022-07-24

## 2022-07-22 MED ORDER — CALCIUM GLUCONATE-NACL 2-0.675 GM/100ML-% IV SOLN
2.0000 g | Freq: Once | INTRAVENOUS | Status: AC
Start: 2022-07-22 — End: 2022-07-22
  Administered 2022-07-22: 2 g via INTRAVENOUS
  Filled 2022-07-22: qty 100

## 2022-07-22 MED ORDER — BUPROPION XL (DAILY) 150 MG OR TB24
150.0000 mg | ORAL_TABLET | Freq: Every day | ORAL | Status: DC
Start: 2022-07-22 — End: 2022-07-24
  Administered 2022-07-22 – 2022-07-23 (×2): 150 mg via ORAL
  Filled 2022-07-22 (×2): qty 1

## 2022-07-22 MED ORDER — VENLAFAXINE HCL 75 MG OR CP24
75.0000 mg | ORAL_CAPSULE | Freq: Every day | ORAL | Status: DC
Start: 2022-07-22 — End: 2022-07-24
  Administered 2022-07-22 – 2022-07-23 (×2): 75 mg via ORAL
  Filled 2022-07-22 (×2): qty 1
  Filled 2022-07-22: qty 2

## 2022-07-22 MED ORDER — POTASSIUM CHLORIDE 10 MEQ/100ML IV SOLN
10.0000 meq | INTRAVENOUS | Status: AC
Start: 2022-07-22 — End: 2022-07-22
  Administered 2022-07-22 (×7): 10 meq via INTRAVENOUS
  Filled 2022-07-22 (×7): qty 100

## 2022-07-22 MED ORDER — BUPROPION XL (DAILY) 300 MG OR TB24
1.00 | ORAL_TABLET | Freq: Every day | ORAL | Status: DC
Start: 2022-03-08 — End: 2022-07-23

## 2022-07-22 NOTE — ED Notes (Signed)
Critical care team at bedside

## 2022-07-22 NOTE — ED Notes (Signed)
Calcium 13.8- critical value

## 2022-07-22 NOTE — ED Notes (Signed)
BGL 180

## 2022-07-22 NOTE — Event / Update (Signed)
ICU POST ROUND UPDATE    Kristen Olson is a 27 year old female with MDD, VF s/p ICD, Migraines, IBS, GERD, Asthma who presents for an intentional calcium channel blocker overdose.      Overnight epinephrine drip downtitrated but still active. D10W has been decreased in response to elevated glucose. Insulin drip still active. She remains in a paced rhythm. Symptoms unchanged.     Updates to plan:  - continue insulin gtt  - titrate D10W to glucose <250  - speak with toxicology  - maintain sitter  - psychiatry consult

## 2022-07-22 NOTE — Progress Notes (Signed)
Encounter opened to query outside prescription records.

## 2022-07-22 NOTE — Interdisciplinary (Signed)
07/22/22 0949   Initial Assessment   CM Initial Assessment * Completed   Readmission Risk Assessment   Readmission Within 30 Days of Discharge * No   Do You Have Transportation Issues/Concerns That Make It Difficult To Get To Your Appointments?  No   Recent Hospitalizations (Within Last 6 Months) * No   High Risk For Readmission * No   Patient Information   Where was the patient admitted from? * Home   Address on Facesheet correct?* Yes   Patient contact phone number on Facesheet correct? Yes   PCP listed on Facesheet correct? Yes   Prior to Level of Function * Ambulatory   Assistive Device * Not applicable   Home Care Services * No   Additional Services on Admission Not Applicable   Available Assistance/Support System  and Prior Community Resources Parent;Spouse / significant other   Primary Caretaker(s) * Self   Primary Family/Caregiver Contact Name, Number and Relationship Kristen Olson, Kristen Olson MOTHER   [phone]   Permission to Contact * Yes   Secondary Contact Name, Number and Relationship Kristen Olson [id number]   Conservator/Public Guardian Name and [place] [phone]   Social Worker Consult   Do you need to see a Education officer, museum? * No   Income Information   Income Source Unknown   Pensions consultant Other (Comment)  ([place])   [place] Affiliation No   Do you have difficulty affording your medications No   Discharge Planning   Living Arrangements on Admission* Spouse /Significant Other   Type of Residence * Apartment   Stairs/Steps to home  No   Patient's Discharge Goal(s) Home   Anticipated Discharge Disposition/Needs Home with Family;Other  Kristen Olson outpt Psych follow up)   Anticipated DischargeTransportation *  Family/Friend   Barriers to Discharge * Clinical reason;Consult Service;Behavioral concerns   Patient Engaged in Discharge Planning * Yes   Others Engaged in Discharge Plan: Name, Relationship and Phone Number  Kristen Olson [phone]   PATIENT CHOICE: Patient/Family/Legal/Surrogate Decision Maker Has Been Given a List Options And Choice In The Selection of Post-Acute Care Providers Yes   Legal Designee/Surrogate Name/Relationship/Contact [place] [phone]   Family/Caregiver's Assessed for * Readiness, willingness, and ability to provide or support self-management activities   Respite Care * Not Applicable   Patient/Family/Other Are In Agreement With Discharge Plan * Yes   Public Health Clearance Needed * No         Medical Necessity:  27 year old female with MDD, VF s/p ICD, Migraines, IBS, GERD, Asthma who presents for a calcium channel blocker overdose. Patient reports earlier yesterday she intentionally took roughly 12 extended release verapamil tablets.     Interpreter Used: Andree Elk Score: 42      PCP verified:  Kristen Olson  [address] / [place] [address]  telephone [phone]  fax [phone]    Pharmacy:   [facility]    PLOF: independent     SNF Hx & preference:  n/a  HH Hx & preference:  n/a  DME Hx & preference: n/a     DISCHARGE PLANNING    Social Support: spouse    Anticipated DC disposition: d/c home pending Psych 5150 hold     Anticipated DC needs: follow-up with [facility] psych       Anticipated Transportation @ discharge: spouse            Covid Vax status: vaccinated  Expected discharge date: 2-4 days      Nicola Police, RN   Case Manager

## 2022-07-22 NOTE — H&P (Signed)
ICU - HISTORY AND PHYSICAL    Date: 07/22/22    NAME: Kristen Olson  27 year old female   MRN: [id number]     Subjective      Chief Complaint:   Chief Complaint   Patient presents with    Overdose-intentional     [facility] per EMS patient texted her friend and stated "call 911 I took a whole bottle of pills" Only medication on scene was robitussin. Patient not answering questions at this time       History of Present Illness:   Kristen Olson is a 28 year old female with MDD, VF s/p ICD, Migraines, IBS, GERD, Asthma who presents for a calcium channel blocker overdose. Patient reports earlier yesterday she intentionally took roughly 12 extended release verapamil tablets. She told her friend she was going to do so and her friend called EMS. On arrival to the ED had few symptoms but later began to become hypotensive and bradycardic. Currently she is endorsing generalized head pressure and intermittent episodes of hot flashes with diaphoresis. She denies SI currently and is happy that she is being admitted to the hospital. She lives with her husband and feels safe at home. Patient follows with [facility] psychiatry outpatient.     ED course:  Received 8g of calcium. Started on insulin/dex and then later an epinephrine gtt.       ED Course as of 07/22/22 0234   Others' Documentation   Fri Jul 21, 2022   1647 Patient admits to taking 12 pills of Verapimil which she takes chronically for headaches.  [AS]   [id number] Chart review - verapimil '180mg'$  extended release tabs on home meds.  [AS]   [id number] Charcoal + monitor   Consult Toxicology [AS]   [phone] Paged Toxicology [AS]   [id number] Tox initial recs: If Unstable --> IV Calcium, high dose insulin therapy, epressors, IVF. Do bedside echo to ensure ok contractility.     In the acute state - patient states she took her meds between 2 and 2:30. Recommend observing for acute peak onset 6 hrs after - at 9PM. If stable w/o symptoms, likely medically cleared. However,  recommend ED obs until AM with psych eval in the AM. [AS]   1847 Troponin T Gen 5: <6  Doubt acute ACS.  [AS]   1847 CMP (Comprehensive Metabolic Panel)(!)  CMP w/o electrolyte changes. No AKI. No transaminitis.  [AS]   [id number] Acetaminophen(!): <5  Doubt acute acetaminophen toxicity [AS]   [id number] CBC w/ Diff [place]  CBC w/o leukocytosis. Negative anemia.  [AS]   [id number] Salicylates: [id number]  Normal aspirin level. Doubt acute toxicity.  [AS]   [id number] VBG + O2 Hbg + O2 Sat + O2 Content Heparin Syringe(!)  VBG wnl - no metabolic/CO2 acidosis.  [AS]   2053 Blood pressure (BP)(!): 90/48 [AA]   2107 UR Drugs of Abuse Screen  Urine Drug screen negative.  [AS]   2107 Troponin T Gen 5: <6  Doubt acute ACS.  [AS]   2107 Hepatitis C Ab: Non Reactive [AS]   2323 [place], Ionized(!): 1.49  Supports verapimil overdose [AS]   2327 Paged ICU for admission. Patient s/p 3g Calcium gluconate. Persistently hypotensive so will start Epi and High dose insulin.  [AS]   2336 ICU agreed to come eval patient [AS]   2336 Glucose, Ven: 92 [AS]   2336 Potassium, Ven: 4.3 [AS]   2336 Sodium, Ven(!): 134 [AS]   2336 Venous Blood Gas Heparin SyringeMarland Kitchen)  VBG similar to prior [AS]   [id number] Basic Metabolic Panel, Blood Green Plasma Separator Tube(!)  BMP w/ hypercalcemia to 11.4 after 6g of Calcium gluconate. No e/o hyperglycemia at this time [AS]   2356 Troponin T Gen 5: <6 [AS]   Sat Jul 22, 2022   [place] [id number] [AS]   0031 S/O: 27 yo with sp suicide attempt by ingesting verapamil XR, received calcium boluses and now on epi gtt. Admitted to ICU. [CC]   0116 Urinalysis with Culture Reflex, when indicated(!)  UA without UTI [AS]      ED Course User Index  [AA] Virgia Land, MD  [AS] Audree Bane, Milon Score, MD  [CC] Eilene Ghazi, MD         ED Clinical Impression as of 07/22/22 0234   Calcium channel blocker overdose, intentional self-harm, initial encounter (CMS-HCC)   Hypotension, unspecified hypotension type        Drips   dextrose  10% 350 mL/hr at 07/22/22 0143    EPINEPHrine 8 mg in sodium chloride 0.9 % 250 mL infusion 0.05 mcg/kg/min (07/22/22 0129)    insulin regular (HUMULIN R, NOVOLIN R) 250 Units in sodium chloride 0.9 % 250 mL infusion 45 Units/hr (07/22/22 0010)    sodium chloride          Past Medical History:  Past Medical History:   Diagnosis Date    Depression     Migraine     Spinal lordosis     Tachycardia     Hx Tachycardia, coronary artery spasms - sees cardiologist, on meds. Defibrillator placed 09/2017.       Surgical History:  Past Surgical History:   Procedure Laterality Date    CARDIAC DEFIBRILLATOR PLACEMENT  09/2017       Allergies:  Allergies   Allergen Reactions    Gluten Meal Anaphylaxis       Medications:  Current Facility-Administered Medications   Medication    charcoal activated liquid 50 g    dextrose 10% infusion    dextrose 50 % solution 12.5 g    enoxaparin (LOVENOX) injection 40 mg    EPINEPHrine 8 mg in sodium chloride 0.9 % 250 mL infusion    glucagon HCl (GLUCAGON) injection kit 1 mg    glucose chewable tablet 16 g    glucose oral gel 1 Tube    insulin regular (HUMULIN R, NOVOLIN R) 250 Units in sodium chloride 0.9 % 250 mL infusion    lansoprazole (PREVACID) DR capsule 30 mg    magnesium oxide (MAG-OX) tablet 400 mg    nalOXone (NARCAN) injection 0.1 mg    polyethylene glycol (MIRALAX) packet 17 g    potassium chloride (KLOR-CON) packet 40 mEq    potassium chloride 10 mEq/18m IVPB 10 mEq    sodium chloride 0.9 % flush 3 mL    sodium chloride 0.9 % flush 3 mL    sodium chloride 0.9 % TKO infusion    venlafaxine XR (EFFEXOR XR) capsule 75 mg     Current Outpatient Medications   Medication Sig    buPROPion (WELLBUTRIN) 300 MG Extended-Release tablet Take 1 tablet (300 mg) by mouth daily.    dexAMETHasone (DECADRON) 4 MG tablet Take 3 tabs every morning x 3 days, 2 tabs every morning x 2 days, then 1 tab in the morning x 1 day    docusate sodium (COLACE) 250 MG capsule TAKE 1 CAPSULE (250 MG) BY MOUTH 2  TIMES DAILY    eptinezumab-jjmr (  VYEPTI) 100 MG/ML SOLN Inject 1 mL (100 mg) into vein Every 3 months.    escitalopram (LEXAPRO) 10 MG tablet     fremanezumab-vfrm (AJOVY) injection Inject 1.5 mL (225 mg) under the skin every 30 days.    lasmiditan (REYVOW) 100 MG tablet Take 1 tablet (100 mg) by mouth as needed for Migraine.    Magnesium Oxide 500 MG CAPS TAKE 1 CAPSULE BY MOUTH EVERY DAY    montelukast (SINGULAIR) 10 MG tablet Take 1 tablet (10 mg) by mouth every evening.    MOTEGRITY 2 MG TABS TAKE 1 TABLET (2 MG) BY MOUTH DAILY    nadolol (CORGARD) 20 MG tablet     omeprazole (PRILOSEC) 20 MG capsule     rimegepant (NURTEC) tablet Take 1 tablet as needed for migraine. No more than 1 tablet per 24 hrs    venlafaxine XR (EFFEXOR XR) 75 MG capsule Take 1 capsule (75 mg) by mouth daily.    verapamil (CALAN SR) 180 MG Controlled-Release tablet      Home medications:  Nadolol 40 mg qday  Omeprazole 20 mg qday  Verapamil 180 mg qday  Montelukast 10 mg qday  Magnesium 500 mg qday  Docusate qday  Albuterol inhaler  Prucalopride 2 mg qday  Rimegepant 75 mg PRN  Ondansetron 4 mg PRN  Prochlorperazine 10 mg PRN  Bupropion 300 mg qday  Venlafaxine 75 mg qday  Hydroxyzine 10 mg PRN  Lasmiditan 100 mg Oral Tab     Social History:  Social History     Socioeconomic History    Marital status: Married     Spouse name: Not on file    Number of children: Not on file    Years of education: Not on file    Highest education level: Not on file   Occupational History    Not on file   Tobacco Use    Smoking status: Never    Smokeless tobacco: Never   Substance and Sexual Activity    Alcohol use: Not Currently    Drug use: Never    Sexual activity: Yes     Partners: Male     Birth control/protection: Pill   Other Topics Concern    Caffeine Concern Not Asked   Social History Narrative    Not on file     Social Determinants of Health     Financial Resource Strain: Not on file   Food Insecurity: Not on file   Transportation Needs: Not on file    Physical Activity: Not on file   Stress: Not on file   Social Connections: Not on file   Intimate Partner Violence: Not on file   Housing Stability: Not on file       Family History:  Family History   Problem Relation Name Age of Onset    Cholesterol/Lipid Disorder Mother      Heart Disease Mother         Objective       CURRENT VITALS   Range (last 24 hours)   T  97.9 F (36.6 C)  Temp  Avg: 98.6 F (37 C)  Min: 97.9 F (36.6 C)  Max: 99.3 F (37.4 C)   BP  123/74  BP  Min: 71/39  Max: 123/74   HR  59  Pulse  Avg: 60  Min: 58  Max: 62   RR  19  Resp  Avg: 18.3  Min: 6  Max: 26   POX  100 %  SpO2  Avg: 96.6 %  Min: 93 %  Max: 100 %     Respiratory Support/Settings:                        Last ABG:  No results found for: "ARTPH", "ARTPO2", "ARTPCO2"    Weights:  Weights (last 14 days)       Date/Time Weight Weight Source Percentage Weight Change (%) Who    07/21/22 2238 93.4 kg (205 lb 14.6 oz) Bed scale 0 % KG            Physical Exam:  Gen: Alert and oriented, speaking softly, diaphoretic  HEENT: PERRL, EOMI  CV: Bradycardic with a regular rhythm, no murmurs, rubs, or gallops.  Resp: Clear bilaterally, normal work of breathing.  Abdomen: Soft, non-tender, non-distended.  Extremities: No lower extremity edema.  Neuro: Moving all extremities spontaneously.    Labs and Other Data:  [place] 11.4    Lactate 2.3    VBG 7.37/51  Acetaminophen undetectable  Salicylates undetectable  Utox negative    BMP:  CBC:   Recent Labs     07/21/22  1643 07/21/22  2302   NA 141 143   K 4.1 4.2   CL 102 106   BICARB 27 25   ANION 12 12   BUN 12 12   CREAT 0.79 0.80   GLU 85 93   [place] 9.9 11.4*   IONCA  --  1.49*     [place] 11.4* (02/23) Mg   Phos      Recent Labs     07/21/22  1643   WBC 9.2   HGB 14.7   HCT 43.9   PLT 265   SEG 61   LYMPHS 29   MONOS 6         LFTs:  COAGS/INFLAMMATORY:   Recent Labs     07/21/22  1643   ALK 156*   AST 19   ALT 26   TBILI 0.16   TP 8.0   ALB 4.7     No results for input(s): "PT", "PTT", "INR" in the last  72 hours.    Recent Labs     07/21/22  2318   LACTATE 2.3*          Cardiac Labs:  Metabolics:   Lab Results   Component Value Date    TROPONIN <6 07/21/2022    TROPONIN <6 07/21/2022    TROPONIN <6 07/21/2022     No results found for: "BNP", "BNPP"    No results found for: "LVEF"    No results found for: "IVCD", "PAPR"  No results found for: "CHOL", "HDL", "LDLCALC", "TRIG", "LDLDIRECT"  No results found for: "A1C"     Cardiac Markers  Recent Labs     07/21/22  1732 07/21/22  2004 07/21/22  2302   TROPONIN <6 <6 <6       No results found for: "BNP", "BNPP"    Lipid Panel  No results found for: "CHOL", "HDL", "LDLCALC", "TRIG", "LDLDIRECT"    No results found for: "A1C"  No results found for: "TSH"    Lab Results   Component Value Date    COLORUA Light-Yellow 07/22/2022    APPEARUA Clear 07/22/2022    GLUCOSEUA Negative 07/22/2022    BILIUA Negative 07/22/2022    KETONEUA Negative 07/22/2022    SGUA 1.025 07/22/2022    BLOODUA Negative 07/22/2022    PHUA 5.5 07/22/2022  PROTEINUA 1+ (A) 07/22/2022    UROBILUA Negative 07/22/2022    NITRITEUA Negative 07/22/2022    LEUKESTUA Negative 07/22/2022    WBCUA 0-2 07/22/2022    RBCUA 0-2 07/22/2022    EPITHCELLSUA FEW 03/25/2002    HYALINEUA >5 (A) 07/22/2022     Micro      EKG:  EKG initially NSR. Later in the evening wide complex paced rhythm.     Imaging:       Assessment/Plan   Kristen Olson is a 27 year old female with MDD, VF s/p ICD, Migraines, IBS, GERD, Asthma who presents for a calcium channel blocker overdose.     #CCB overdose  #MDD  Took 12 extended release verapamil. Currently bradycardic and hypotensive. Wide complex paced rhythm on EKG after initially being NSR on arrival. Currently not endorsing SI. Follows with [facility] outpatient at [facility]. States she feels safe at home.   - toxicology consulted   - IV calcium   - high dose insulin at 0.5 u/kg/hr and D10W 350 cc/hr   - epinephrine gtt  - check K q1h  - 1:1 sitter  - home bupropion  -  home venlafaxine  - psychiatry consult in AM    #Hx of VF s/p ICD placement  Initially presented 5-6 years ago for syncopal episodes. Outpatient monitor that showed ventricular arrhythmias. She had a ICD placed shortly thereafter and started on verapamil and nadolol. Follows with cardiology at [facility]. She has continued to have episodes of syncope, so had TTE 01/19/22 w/ EF 55% and no valvular abnormalities.   - hold home verapamil and nadolol    #Migraines  Refractory migraines. Has tried numerous medications. Patient gets home eptinezumab injections as well as magnesium for prevention. Takes rimegepant and lasmiditan for acute treatment. Follows with neurology outpatient.   - home magnesium oxide    #IBS   Takes docusate and prucalopride.   - miralax qday    #GERD  - home omeprazole    #Asthma  Takes montelukast.     FEN:        clear liquid diet  Analgesics:   Sedation:   Thromboprophylaxis:   lovenox  Head of Bed:    Ulcer Prophylaxis:   Glucose Management: as above  Skin:   Bowel:   Indwelling Lines:     Patient Lines/Drains/Airways Status       Active PICC Line / CVC Line / PIV Line / Drain / Airway / Intraosseous Line / Epidural Line / ART Line / Line Type       Name Placement date Placement time Site Days    Peripheral IV - 18 G Left Antecubital 07/21/22  1643  Antecubital  less than 1    Peripheral IV - 20 G Left Hand 07/21/22  2324  Hand  less than 1    Peripheral IV - 20 G Right Antecubital 07/22/22  0004  Antecubital  less than 1                  De-escalation of antibiotics:    Code status: FC/FC  This patient was seen and discussed with Gardiner Sleeper, MD    Elray Mcgregor, MD  [place] PGY-1    07/22/2022 2:34 AM

## 2022-07-22 NOTE — ED Notes (Signed)
Verification of orders with pharmacy prior to initiating high dose insulin infusion and bolus. Confirmed with Pharmacist Wells Guiles and RN La Valle.

## 2022-07-22 NOTE — Consults (Signed)
TOXICOLOGY PROGRESS NOTE     Kristen Olson  MRN: [id number]  DOB: 01/16/1996  PMD: Dione Plover [facility] Day:    CC: Overdose-intentional ([place] per EMS patient texted her friend and stated "call 911 I took a whole bottle of pills" Only medication on scene was robitussin. Patient not answering questions at this time )      History of Present Illness    Pt seen and examined. Pt is a 27 year old female with history of depression, headaches, tachycardia, PPM (2018) who presents with verapamil overdose.     Overnight events:   - became hypotensive overnight to [id number] systolics, HR at 60  - received 1L fluid bolus without effect  - received calcium gluc/chloride pushes  - started on epinephrine gtt and HIET   - has been off epinephrine since 8AM    On interview, patient reports having mild abdominal pain. Improved lightheadedness. No chest pain, shortness of breath.     Past Medical/Surgical History    PMHx:   Past Medical History:   Diagnosis Date    Depression     Migraine     Spinal lordosis     Tachycardia     Hx Tachycardia, coronary artery spasms - sees cardiologist, on meds. Defibrillator placed 09/2017.     PSHx:    Past Surgical History:   Procedure Laterality Date    CARDIAC DEFIBRILLATOR PLACEMENT  09/2017         Allergies    Gluten meal    Review of Systems    Constitutional: Negative for fever and chills.  Cardiovascular: Negative for chest pain.  Respiratory: Negative for cough and shortness of breath.  Gastrointestinal: +abdominal pain, negative vomiting  Genitourinary: Negative for dysuria, urgency, and frequency.  Neurological: Negative for dizziness, confusion.    Physical Exam       07/22/22  0930 07/22/22  1000 07/22/22  1030 07/22/22  1100   BP: 105/64 (!) 107/57 90/60 104/61   Pulse: 60 62 68 64   Temp:       Resp: '16 17 19 23   '$ SpO2:  99% 99% 100%     Vital signs reviewed.  General: Well developed, well nourished, no acute distress.    Eyes: No conjunctival injection. No scleral icterus. Pupils mid ranged and reactive  ENT: No facial swelling. Moist mucus membranes.  Neck: Trachea midline. No lymphadenopathy.   Lungs: Symmetric breath rise  Cardiac: Regular rate and rhythm.   Abdominal: Soft, nontender, nondistended.   Extremity: Warm, well perfused. No deformity, edema.   Skin: No rashes, lesions, ecchymosis, petechiae  Neuro: Aox3, follows commands. 5/5 strength in BUE and BLE. No tremors, no clonus.      RESULTS     Most Recent EKG:  07/21/2022 1650 NSR HR 64 QRS 102 Qtc 412 non specific TW changes   07/21/2022 1845 V-paced rhythm HR 60  07/21/2022 2111 V-paced rhythm HR 60  07/22/2022 0251 V-paced rhythm HR 60    Imaging    No results found.    Labs      Lab Results   Component Value Date    WBC 7.7 07/22/2022    RBC 2.98 (L) 07/22/2022    HGB 9.1 (L) 07/22/2022    HCT 31.9 (L) 07/22/2022    MCV 107.0 (H) 07/22/2022    MCHC 28.5 (L) 07/22/2022    RDW 12.5 07/22/2022    PLT 187 07/22/2022    MPV 11.7 07/22/2022  Lab Results   Component Value Date    BUN 6 07/22/2022    CREAT 0.73 07/22/2022    CL 101 07/22/2022    NA 136 07/22/2022    K 4.8 07/22/2022    [place] 10.2 07/22/2022    TBILI 0.17 07/22/2022    ALB 3.1 (L) 07/22/2022    TP 5.0 (L) 07/22/2022    AST 11 07/22/2022    ALK 97 07/22/2022    BICARB 24 07/22/2022    ALT 15 07/22/2022    GLU 244 (H) 07/22/2022     Lab Results   Component Value Date    NA 136 07/22/2022    K 4.8 07/22/2022    CL 101 07/22/2022    BICARB 24 07/22/2022    BUN 6 07/22/2022    CREAT 0.73 07/22/2022    GLU 244 (H) 07/22/2022    [place] 10.2 07/22/2022     Lab Results   Component Value Date    AST 11 07/22/2022    ALT 15 07/22/2022    ALK 97 07/22/2022    TP 5.0 (L) 07/22/2022    ALB 3.1 (L) 07/22/2022    TBILI 0.17 07/22/2022    DBILI <0.2 07/22/2022     No results found for: "TSH", "FREET4", "T3"  Lab Results   Component Value Date    COLORUA Light-Yellow 07/22/2022    APPEARUA Clear 07/22/2022    GLUCOSEUA Negative  07/22/2022    BILIUA Negative 07/22/2022    KETONEUA Negative 07/22/2022    SGUA 1.025 07/22/2022    BLOODUA Negative 07/22/2022    PHUA 5.5 07/22/2022    PROTEINUA 1+ (A) 07/22/2022    UROBILUA Negative 07/22/2022    NITRITEUA Negative 07/22/2022    LEUKESTUA Negative 07/22/2022    WBCUA 0-2 07/22/2022    RBCUA 0-2 07/22/2022    EPITHCELLSUA FEW 03/25/2002    HYALINEUA >5 (A) 07/22/2022     Lab Results   Component Value Date    AMPCLASS Negative 07/21/2022    BARBCLASS Negative 07/21/2022    BENZYLCGNN Negative 07/21/2022    METHADONE Negative 07/21/2022    OPIATESCL Negative 07/21/2022    OXY Negative 07/21/2022    PHENCYCLDN Negative 07/21/2022    TETCANNABIN Negative 07/21/2022     No results found for: "ETOH"    All lab results reviewed. Pertinent findings discussed with the patient. She understands the findings.    MDM / Impression / Plan:      Pt is a 27 year old yo female with history of depression, headaches, tachycardia, PPM (2018) who presents with c/f verapamil ingestion. Time of ingestion approximately 2PM on 07/21/22. Overnight, she became hypotensive, not responsive to 1L fluid bolus, was started on treatment with IV calcium, epinephrine gtt, and HIET. Since then, she has been off epinephrine gtt since 8AM. She is on 0.5u/kr/hr insulin gtt and received calcium pushes prn. Reassuringly, her labwork is unremarkable including lactate. Her current vital signs are normal with MAPs in the 70-90s, HR remains at 60s. Her symptoms are improving and she has no focal exam findings. As we are approaching 24 hours since her ingestion, it is reasonable to assess and see if can wean her off insulin at this point.     Recommendations:  - Around 1-2PM can either discontinue the insulin gtt or a quick wean (drop to 0.1u/kg/hr for 1-2 hrs and then discontinue)  ---- If patient's BP drops, then can restart insulin and give IV calcium pushes  - If  she remains stable for the rest of the afternoon and evening off the insulin  gtt, would recommend monitoring her overnight to ensure she continues to be HDS and can proceed with psychiatric evaluation    Case discussed with Dr. Alla Feeling.   ____________________________  Jolly Mango, MD  Medical Toxicology Fellow  [place] Division of Toxicology  Pager: [phone]

## 2022-07-22 NOTE — ED Notes (Addendum)
Ionized Calcium 1.85.  MD Abledo paged and aware. Per toxicology recommendations, goal for Ionized [place] 2.0
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
## 2022-07-22 NOTE — Plan of Care (Signed)
Problem: Promotion of Health and Safety  Goal: Promotion of Health and Safety  Description: The patient remains safe, receives appropriate treatment and achieves optimal outcomes (physically, psychosocially, and spiritually) within the limitations of the disease process by discharge.    Information below is the current care plan.  Outcome: Progressing  Flowsheets  Taken 07/22/2022 1638 by Leeroy Cha, RN  Guidelines: Inpatient Nursing Guidelines  Individualized Interventions/Recommendations #1: remove all harmful objections from room while on suicide precautions  Individualized Interventions/Recommendations #2 (if applicable): encourage family to visit and interact with patient  Individualized Interventions/Recommendations #3 (if applicable): encourage oral intake  Individualized Interventions/Recommendations #4 (if applicable): monitor BP, MAP 65-85  Individualized Interventions/Recommendations #5 (if applicable): monitor labs q2H and replace electrolytes as needed  Outcome Evaluation (rationale for progressing/not progressing) every shift: No acute events. Pt off D10 and Insulin gtt. Labs q2H. Ambulates to bedside toilet without assistance. Electolytes replaced and labs q2H. Diet changed to theraputic from liquids. VSS. All lines patent and secure. Many family members at bedside. 1:1 supervision, patient denies SI throughout shift.  Taken 07/22/2022 0800 by Lacie Draft, RN  Patient /Family stated Goal: go home today  Note:

## 2022-07-22 NOTE — Progress Notes (Signed)
[facility] day:   0 days - Admitted on: 07/21/2022    Kristen Olson is a 27 year old female with pertinent history of MDD, VF s/p ICD, Migraines, IBS, GERD, Asthma who presents for a calcium channel blocker overdose.     Pt presented with bradycardia paced to 60 and hypotension to 80s/60s after intentionally overdosing with verapamil with suicidal intent. Toxicology consulted, started on high dose insulin drip and D10W for CCB toxicity with IV calcium to goal iCa of 2 as well as epinephrine gtt for bradycardia. Weaned down insulin gtt on 2/24 at ~2 pm once >24h s/p ingestion and discontinued at ~4:30 PM given stable blood pressures.      To-Do's:   [ ]$  monitor BP q2h off insulin gtt for hypotension; no need to trend iCa once off insulin  [ ]$  BMP q12h, replete lytes  [ ]$  f/u blood sugar, currently on D5W @100cc$ /hr until PO intake improves  [ ]$  f/u toxicology recs  [ ]$  psych consulted, will see pt tmrw 2/25 or Mon 2/26; maintain 1:1 sitter  [ ]$  consider cardiology consult for appropriate meds as holding verapamil and nadolol   [x]$  home birth control (norethrindrone) and montelukast ordered per pt request    24 Hour - Interval Events   No acute events since admission to ICU  Now off insulin drip    Subjective   Pt reports feeling overall well. Little appetite and some nausea. Notes some emesis yesterday. No CP, SOB, diarrhea, urinary symptoms, fever/chills    Physical Exam   Temp  Min: 97.6 F (36.4 C)  Max: 98.5 F (36.9 C)  Pulse  Min: 50  Max: 69  BP  Min: 71/39  Max: 130/77  Resp  Min: 6  Max: 26  SpO2  Min: 93 %  Max: 100 %    BP (!) 81/47   Pulse 62   Temp 97.8 F (36.6 C)   Resp 14   Ht 5' 1"$  (1.549 m)   Wt 95.1 kg (209 lb 10.5 oz)   LMP 05/29/2022 (Approximate)   SpO2 99%   BMI 39.61 kg/m  O2 Device: None (Room air)      02/23 0600 - 02/24 0559  In: 1200 [I.V.:1200]  Out: -   Urine x 0 Stool x 0 Emesis x 0       Gen: Alert and oriented, comfortably  laying in bed  HEENT: PERRL, EOMI  CV: Bradycardic with a regular rhythm, no murmurs, rubs, or gallops.  Resp: CTAB, normal work of breathing.  Abdomen: Soft, non-tender, non-distended.  Extremities: No lower extremity edema.  Neuro: Moving all extremities spontaneously, moving around in bed easily    Pertinent Labs and Imaging and Meds       WBC 9.7 (02/24) HGB 14.0 (02/24) PLT 232 (02/24)    HCT 41.5 (02/24)      Na 139 (02/24) CL 106 (02/24) BUN 3* (02/24) GLU   105* (02/24)   K 3.7 (02/24) CO2 23 (02/24) Cr 0.62 (02/24)      [place] 9.3 (02/24) Mg   Phos            Scheduled Medications   buPROPion  150 mg Daily    enoxaparin  40 mg Daily    lansoprazole  30 mg QAM AC    magnesium oxide  400 mg Daily    montelukast  10 mg QPM    norethindrone  1 tablet  Daily    sodium chloride  3 mL Q8H    venlafaxine XR  75 mg Daily     Continuous Medications   dextrose-sodium chloride 5%-0.45%      sodium chloride       PRN Medications   dextrose  12.5 g PRN    glucagon  1 mg Once PRN    glucose  4 tablet PRN    glucose  1 Tube PRN    nalOXone  0.1 mg Q2 Min PRN    ondansetron  4 mg Q6H PRN    polyethylene glycol  17 g Daily PRN    sodium chloride  3 mL PRN    sodium chloride   Continuous PRN       Assessment / Plan   Kristen Olson is a 27 year old female with MDD, VF s/p ICD, Migraines, IBS, GERD, Asthma who presents for a calcium channel blocker overdose.      #CCB overdose  #MDD  Currently bradycardic being V paced by her pacemaker. Pressures now stable. Continuing D5 1/2 NS iso low blood glucose.   - toxicology consulted, appreciate recs  - 1:1 sitter  - home bupropion  - home venlafaxine  - psychiatry consult in AM  - Telemetry  - d5 1/2NS     #Hx of VF s/p ICD placement  Pt's pacemaker now pacing her. Consider device interrogation and cards consult given pt now completely paced. Pt may still be quite bradycardic, or have limited sinus activity given lack of P waves on EKG.   - hold home verapamil and nadolol  -  Cards consult, ICD interrogation     #Migraines  Patient gets home eptinezumab injections as well as magnesium for prevention. Takes rimegepant and lasmiditan for acute treatment. Follows with neurology outpatient.   - home magnesium oxide     #IBS   Takes docusate and prucalopride.   - miralax qday     #GERD  - home omeprazole     #Asthma  Takes montelukast.     Discharge planning: consultant input  Foley: No foley catheter  VTE prophylaxis: not indicated due to low risk of VTE  Diet: Diet Therapeutic; Gluten Free; Disposable Utensils  Last BM:    IV fluids: D5W at 100 mL/hr  Access: PIVs  Code status: Full Code    Patient care was discussed in detail with Desmond Dike, MD.  Days to Complete Medical Plan of Care Requiring Ongoing Hospitalization: Anticipated Tomorrow; Care Needs: Consultant recommendations, Cards Psych                 (Discharge Planning and EDD Info)     Aleksandr "Sasha" Unk Lightning, MD  Internal Medicine PGY1    ATTENDING PROGRESS NOTE ATTESTATION    Subjective    I have reviewed the history.  Interval history:      NAEO. No fcs. No cp or sob. Feels ok. Does not feel depressed at this time and denies any SI or HI at this time. Poor appetite. Hypoglycemia. Now off insulin gtt    CCB overdose  MDD  - psych c/s appreciate recs  - home wellbutrin, home buproprion  - cards c/s for device interrogation given brady and V pacing.  - tele    Hypoglycemia  - D5 1/2 NS    Objective    I have examined the patient and concur with the resident exam.    Assessment and Plan    I agree with the resident  care plan.    See the resident note for further details.

## 2022-07-22 NOTE — Progress Notes (Signed)
Brief ICU Downgrade Note    Brief ICU Course:  41F with PMH MDD, migraines, and hx VF arrest s/p ICD who presented with bradycardia paced to 60 and hypotension to 80s/60s after intentionally overdosing with verapamil with suicidal intent. Toxicology consulted, started on high dose insulin drip and D10W for CCB toxicity with IV calcium to goal iCa of 2 as well as epinephrine gtt for bradycardia. Weaned down insulin gtt on 2/24 at ~2 pm once >24h s/p ingestion and discontinued at ~4:30 PM given stable blood pressures.     To-Do's:   '[ ]'$  monitor BP q2h off insulin gtt for hypotension; no need to trend iCa once off insulin  '[ ]'$  BMP q12h, replete lytes  '[ ]'$  f/u blood sugar, currently on D5W '@100cc'$ /hr until PO intake improves  '[ ]'$  f/u toxicology recs  '[ ]'$  psych consulted, will see pt tmrw 2/25 or Mon 2/26; maintain 1:1 sitter  '[ ]'$  consider cardiology consult for appropriate meds as holding verapamil and nadolol   '[x]'$  home birth control (norethrindrone) and montelukast ordered per pt request

## 2022-07-22 NOTE — ED Notes (Signed)
Report to [facility] and for her to assume pt care.

## 2022-07-23 DIAGNOSIS — Z95810 Presence of automatic (implantable) cardiac defibrillator: Secondary | ICD-10-CM | POA: Insufficient documentation

## 2022-07-23 DIAGNOSIS — T461X2A Poisoning by calcium-channel blockers, intentional self-harm, initial encounter: Principal | ICD-10-CM | POA: Insufficient documentation

## 2022-07-23 DIAGNOSIS — G43909 Migraine, unspecified, not intractable, without status migrainosus: Secondary | ICD-10-CM | POA: Insufficient documentation

## 2022-07-23 DIAGNOSIS — F32A Depression, unspecified: Secondary | ICD-10-CM | POA: Insufficient documentation

## 2022-07-23 DIAGNOSIS — J45909 Unspecified asthma, uncomplicated: Secondary | ICD-10-CM | POA: Insufficient documentation

## 2022-07-23 DIAGNOSIS — F39 Unspecified mood [affective] disorder: Secondary | ICD-10-CM

## 2022-07-23 LAB — BASIC METABOLIC PANEL, BLOOD
Anion Gap: 10 mmol/L (ref 7–15)
BUN: 4 mg/dL — ABNORMAL LOW (ref 6–20)
Bicarbonate: 24 mmol/L (ref 22–29)
Calcium: 8.7 mg/dL (ref 8.5–10.6)
Chloride: 105 mmol/L (ref 98–107)
Creatinine: 0.74 mg/dL (ref 0.51–0.95)
Glucose: 103 mg/dL — ABNORMAL HIGH (ref 70–99)
Potassium: 4.4 mmol/L (ref 3.5–5.1)
Sodium: 139 mmol/L (ref 136–145)
eGFR Based on CKD-EPI 2021 Equation: >60 mL/min/{1.73_m2}

## 2022-07-23 LAB — CBC WITH DIFF, BLOOD
ANC-Automated: 5.3 10*3/uL (ref 1.6–7.0)
Abs Basophils: 0 10*3/uL (ref <0.2)
Abs Eosinophils: 0.3 10*3/uL (ref 0.0–0.5)
Abs Lymphs: 3 10*3/uL (ref 0.8–3.1)
Abs Monos: 0.7 10*3/uL (ref 0.2–0.8)
Basophils: 0 %
Eosinophils: 3 %
Hct: 37.2 % (ref 34.0–45.0)
Hgb: 12.4 gm/dL (ref 11.2–15.7)
Lymphocytes: 32 %
MCH: 30.6 pg (ref 26.0–32.0)
MCHC: 33.3 g/dL (ref 32.0–36.0)
MCV: 91.9 um3 (ref 79.0–95.0)
MPV: 11.9 fL (ref 9.4–12.4)
Monocytes: 8 %
Plt Count: 196 10*3/uL (ref 140–370)
RBC: 4.05 10*6/uL (ref 3.90–5.20)
RDW: 12.6 % (ref 12.0–14.0)
Segs: 57 %
WBC: 9.3 10*3/uL (ref 4.0–10.0)

## 2022-07-23 LAB — GLUCOSE (POCT)
Glucose (POCT): 92 mg/dL (ref 70–99)
Glucose (POCT): 101 mg/dL — ABNORMAL HIGH (ref 70–99)
Glucose (POCT): >600 mg/dL (ref 70–99)
Glucose (POCT): >600 mg/dL (ref 70–99)
Glucose (POCT): 79 mg/dL (ref 70–99)
Glucose (POCT): 103 mg/dL — ABNORMAL HIGH (ref 70–99)
Glucose (POCT): 77 mg/dL (ref 70–99)
Glucose (POCT): 112 mg/dL — ABNORMAL HIGH (ref 70–99)

## 2022-07-23 LAB — LIVER PANEL, BLOOD
ALT (SGPT): 20 U/L (ref 0–33)
AST (SGOT): 19 U/L (ref 0–32)
Albumin: 3.7 g/dL (ref 3.5–5.2)
Alkaline Phos: 123 U/L (ref 40–130)
Bilirubin, Dir: <0.2 mg/dL (ref <=0.2)
Bilirubin, Tot: 0.19 mg/dL (ref <1.2)
Total Protein: 6.3 g/dL (ref 6.0–8.0)

## 2022-07-23 LAB — MAGNESIUM, BLOOD: Magnesium: 1.9 mg/dL (ref 1.6–2.6)

## 2022-07-23 LAB — COVID-19 BINAXNOW ANTIGEN (POCT): COVID-19 Antigen (POCT): NEGATIVE

## 2022-07-23 LAB — MRSA SURVEILLANCE CULTURE

## 2022-07-23 MED ORDER — LURASIDONE HCL 20 MG PO TABS
20.0000 mg | ORAL_TABLET | Freq: Every evening | ORAL | Status: DC
Start: 2022-07-24 — End: 2022-12-20

## 2022-07-23 MED ORDER — BUPROPION XL (DAILY) 150 MG OR TB24
150.0000 mg | ORAL_TABLET | Freq: Every day | ORAL | 0 refills | Status: DC
Start: 2022-07-24 — End: 2022-12-20

## 2022-07-23 MED ORDER — POLYETHYLENE GLYCOL 3350 OR POWD
17.0000 g | Freq: Every day | ORAL | 0 refills | Status: DC
Start: 2022-07-23 — End: 2022-12-20

## 2022-07-23 MED ORDER — LURASIDONE HCL 20 MG PO TABS
20.0000 mg | ORAL_TABLET | Freq: Every evening | ORAL | Status: DC
Start: 2022-07-23 — End: 2022-07-24
  Administered 2022-07-23: 20 mg via ORAL
  Filled 2022-07-23: qty 1

## 2022-07-23 MED ORDER — MAGNESIUM SULFATE 2 GM/50ML IV SOLN
2.0000 g | Freq: Once | INTRAVENOUS | Status: AC
Start: 2022-07-23 — End: 2022-07-23
  Administered 2022-07-23: 2 g via INTRAVENOUS
  Filled 2022-07-23: qty 50

## 2022-07-23 MED ORDER — GABAPENTIN 100 MG OR CAPS
100.0000 mg | ORAL_CAPSULE | Freq: Three times a day (TID) | ORAL | 0 refills | Status: DC | PRN
Start: 2022-07-23 — End: 2022-11-20

## 2022-07-23 MED ORDER — GABAPENTIN 100 MG OR CAPS
100.0000 mg | ORAL_CAPSULE | Freq: Three times a day (TID) | ORAL | Status: DC | PRN
Start: 2022-07-23 — End: 2022-07-24

## 2022-07-23 MED ORDER — NORETHINDRONE (CONTRACEPTIVE) 0.35 MG OR TABS
1.0000 | ORAL_TABLET | Freq: Every day | ORAL | 0 refills | Status: DC
Start: 2022-07-24 — End: 2023-02-02

## 2022-07-23 NOTE — Progress Notes (Signed)
[facility] day:   1 day - Admitted on: 07/21/2022    Kristen Olson is a 27 year old female with pertinent history of MDD, VF s/p ICD, Migraines, IBS, GERD, Asthma who presents for a calcium channel blocker overdose.     24 Hour - Interval Events   NAEO. Borderline hypotensive overnight but improved this morning.    Subjective   Feeling better this morning, less fatigued and mood better.     Physical Exam   Temp  Min: 96.8 F (36 C)  Max: 98.5 F (36.9 C)  Pulse  Min: 60  Max: 78  BP  Min: 80/54  Max: 120/82  Resp  Min: 13  Max: 25  SpO2  Min: 94 %  Max: 100 %    BP (!) 88/59 (BP Location: Left arm, BP Patient Position: Supine)   Pulse 62   Temp 97 F (36.1 C)   Resp 18   Ht '5\' 1"'$  (1.549 m)   Wt 95.1 kg (209 lb 10.5 oz)   LMP 05/29/2022 (Approximate)   SpO2 96%   BMI 39.61 kg/m  O2 Device: None (Room air)      02/24 0600 - 02/25 0559  In: 7012.7 [P.O.:100; I.V.:6912.7]  Out: -   Urine x 11 Stool x 0 Emesis x 0       Gen: Alert and oriented, comfortably laying in bed  HEENT: PERRL, EOMI  CV: RRR, no murmurs, rubs, or gallops.  Resp: CTAB, normal work of breathing.  Abdomen: Soft, non-tender, non-distended.  Extremities: No lower extremity edema.  Neuro: Moving all extremities spontaneously, moving around in bed easily    Pertinent Labs and Imaging and Meds       WBC 9.3 (02/25) HGB 12.4 (02/25) PLT 196 (02/25)    HCT 37.2 (02/25)      Na 139 (02/25) CL 105 (02/25) BUN 4* (02/25) GLU   103* (02/25)   K 4.4 (02/25) CO2 24 (02/25) Cr 0.74 (02/25)      [place] 8.7 (02/25) Mg   Phos      Scheduled Medications   buPROPion  150 mg Daily    enoxaparin  40 mg Daily    lansoprazole  30 mg QAM AC    lurasidone  20 mg QPM    magnesium oxide  400 mg Daily    montelukast  10 mg QPM    norethindrone  1 tablet Daily    sodium chloride  3 mL Q8H    venlafaxine XR  75 mg Daily     Continuous Medications   sodium chloride       PRN Medications   dextrose  12.5 g PRN    gabapentin  100  mg TID PRN    glucagon  1 mg Once PRN    glucose  4 tablet PRN    glucose  1 Tube PRN    nalOXone  0.1 mg Q2 Min PRN    ondansetron  4 mg Q6H PRN    polyethylene glycol  17 g Daily PRN    sodium chloride  3 mL PRN    sodium chloride   Continuous PRN       Assessment / Plan   Kristen Olson is a 27 year old female with MDD, VF s/p ICD, Migraines, IBS, GERD, Asthma who presents for a calcium channel blocker overdose.      #CCB overdose  #MDD  Patiented presented after verapamil  ingestion around 07/21/22 2PM. She became hypotensive and was not fluid responsive. She was started on epinephrine gtt, HIET, and IV calium, which all have since been weaned off. Patient still having borderline hypotension but HR 70s-80s currently, not paced.   - toxicology consulted, appreciate recs  - psychiatry consulted, appreciate recs   - page psych when medically ready, may require IP psych admission  - Telemetry  - 1:1 sitter  - home bupropion  - home venlafaxine  - start Latuda    #Hx of VF s/p ICD placement  Pt's pacemaker now pacing her. Consider device interrogation and cards consult given pt now completely paced. Pt may still be quite bradycardic, or have limited sinus activity given lack of P waves on EKG.   - hold home verapamil and nadolol  - EP consult 2/26 for medication recommendations  - Biotronik ICD interrogation 2/25 PM or 2/26 AM     #Migraines  Patient gets home eptinezumab injections as well as magnesium for prevention. Takes rimegepant and lasmiditan for acute treatment. Follows with neurology outpatient.   - home magnesium oxide     #IBS   Takes docusate and prucalopride.   - miralax qday prn     #GERD  - home omeprazole     #Asthma  - home montelukast    Discharge planning: clinical improvement and consultant input  Foley: No foley catheter  VTE prophylaxis: not indicated due to low risk of VTE  Diet: Diet Therapeutic; Gluten Free; Disposable Utensils  Last BM:    IV fluids: none  Access: PIVs  Code  status: Full Code    Patient care was discussed in detail with Desmond Dike, MD.  Days to Complete Medical Plan of Care Requiring Ongoing Hospitalization: Anticipated Tomorrow; Care Needs: Consultant recommendations, Cards Psych                 (Discharge Planning and EDD Info)     Kerry Hough, MD  Internal Medicine, PGY-2  07/23/22

## 2022-07-23 NOTE — Plan of Care (Signed)
Problem: Promotion of Health and Safety  Goal: Promotion of Health and Safety  Description: The patient remains safe, receives appropriate treatment and achieves optimal outcomes (physically, psychosocially, and spiritually) within the limitations of the disease process by discharge.    Information below is the current care plan.  Outcome: Progressing  Flowsheets  Taken 07/23/2022 0009 by Les Pou, RN  Individualized Interventions/Recommendations #5 (if applicable): monitor glucose and electolyte imbalances  Outcome Evaluation (rationale for progressing/not progressing) every shift: Pt remains with 1:1 observation, no current feelings of SI. Psych consulted and spoke with pt for about an hour about history and medication regimen. Pt open to psych's recomenations. Pt getting Q2H bg checks, no s/s of hypoglycemia. Pt's BP soft when asleep, MAP >65 while awake, medicine team aware. Plan of care ongoing  Taken 07/22/2022 1915 by Les Pou, RN  Patient /Family stated Goal: "get rest and go home"  Taken 07/22/2022 1638 by Leeroy Cha, RN  Guidelines: Inpatient Nursing Guidelines  Individualized Interventions/Recommendations #1: remove all harmful objections from room while on suicide precautions  Individualized Interventions/Recommendations #3 (if applicable): encourage oral intake  Individualized Interventions/Recommendations #4 (if applicable): monitor BP, MAP 65-85  Note:

## 2022-07-23 NOTE — Plan of Care (Addendum)
Problem: Promotion of Health and Safety  Goal: Promotion of Health and Safety  Description: The patient remains safe, receives appropriate treatment and achieves optimal outcomes (physically, psychosocially, and spiritually) within the limitations of the disease process by discharge.    Information below is the current care plan.  Outcome: Progressing  Flowsheets  Taken 07/23/2022 1529  Guidelines: Inpatient Nursing Guidelines  Individualized Interventions/Recommendations #1: Continue sitter constant obervasation for safety, monitor patient room for harmful objects and remove them  Individualized Interventions/Recommendations #2 (if applicable): Patient will tolerate po diet as ordered, continue glucose monitoring per orders  Individualized Interventions/Recommendations #3 (if applicable): Patient will tolerate activity with staff supervision  Individualized Interventions/Recommendations #4 (if applicable): Patient skin will remain intact.  Outcome Evaluation (rationale for progressing/not progressing) every shift: Patient oriented x4, very pleasant, denies any SI this shift, continue sitter monitoring for patient safety, family at bedside, no unsafe behaviors seen at this time.  Denies any pain, continue cardiac monitoring, bp stable.  Good appetite for meals, continue glucose monitoring AC/HS.  Patient able to ambulate to bathroom, steady gait.  Patient states understanding of plan of care, med team and psychiatry on rounds today.  No signs of acute distress, continue to monitor.  Taken 07/23/2022 0800  Patient /Family stated Goal: "take a shower today"  Note:      NO change in assessment, family at bedside, continue sitter in room.

## 2022-07-23 NOTE — Utilization Review (Signed)
Kristen Olson Encounter Date/Tm: 07/21/2022 1603    Patient Name: [name], Kristen Olson    MRN: [id number]    Contact Serial #: [id number]     Hospital Account: [id number]   ENCOUNTER:  Patient Class: Inpatient Admission   Unit: [facility]  4   Bed: [id number]   Admitting Provider: Desmond Dike, Md   Referring Physician: No Pcp, Could Not Be Va*   Attending Provider: Desmond Dike, Md   Chief Complaint: OVERDOSE-INTENTIONAL   Adm Diagnosis: Calcium channel blocker overdose, intentional self-harm, initial encounter (CMS-HCC) [T46.1X2A]  Hypotension, unspecified hypotension type [I95.9]         PATIENT:               Name: Makeda, Ridley DOB: [birth year] (26 yrs)   Address: [phone] Sex: Female   City/State/Zip: [place], [place] [address]     Language: English         Marital Status: Married   Primary Care Provider: Laqueta Carina, NP         Religion: Darrick Meigs   Email: [email or web address]         Primary Phone: [phone]   EMERGENCY CONTACT   Contact Name Legal Guardian? Relationship to Patient Cell Phone Home Phone   1. DESHANTA, FREUND [id number]       [phone]      GUARANTOR:            Guarantor: Si Gaul     DOB: 1996/05/29   Address: [address] Sex: Female     [place], [place] [address]     Relation to Patient: Self       Home Phone: [phone]   Guarantor ID: [id number]       Work Phone:     [employer]anne [employer]   Employer:           Status: NOT EMPLOYED   COVERAGE:          PRIMARY INSURANCE   Payor: [place]: [place] Number: [phone] Insurance Type: [place] Name: KAMIYAH, SUSKO* Subscriber DOB: 1996-04-13   Subscriber ID: [id number] Pt Rel to Subscriber: Self   SECONDARY INSURANCE   Payor:   Plan:     Group Number:   Insurance Type:     Subscriber Name:   Subscriber DOB:     Subscriber ID:   Pt Rel to Subscriber:         Printed by Robie Ridge  July 23, 2022 11:33 AM         Contact Serial # [phone])

## 2022-07-23 NOTE — Interdisciplinary (Signed)
19:40 Report given to [place]. RN Mikey Kirschner. Discuss all pretinent information. EMS RN Catheryn Bacon also listening while I was giving report.   Discuss all pertinent information and plan of care. PPM interrogation already done endorse to RN Catheryn Bacon to update [facility] RN Remo Lipps about it and to hand them the interrogation report.  19:50 Discharge patient in no apparent distress noted. All personal belongings sent with the patient.

## 2022-07-23 NOTE — Plan of Care (Addendum)
Patient going to transfer to [facility] room [phone] , phone number [phone]

## 2022-07-23 NOTE — Interdisciplinary (Addendum)
Call to [facility] to notify patient is stable for transfer. Clinical and orders uploaded by Charles,CM to [place]. Per KP CM they will review for possible transfer but will require covid rapid and a copy of the chart at time of transfer. CM updated team via secure chat. MD ordering Covid rapid.    Update:  Spoke with the patient at the bedside to discuss possible transfer. She is agreeable with the transfer if a bed becomes available. KP will reach out to the RN if it does and will need a copy of the chart. KP will set up transport if needed.    Jeoffrey Massed

## 2022-07-23 NOTE — Discharge Summary (Signed)
 Date of Admission:  07/21/2022  Date of Discharge:  07/23/2022    Patient Name:  Kristen Olson    Principal Diagnosis (required):   Verapamil overdose    Hospital Problem List (required):  Active Hospital Problems    Diagnosis    Calcium channel blocker overdose, intentional self-harm, initial encounter (CMS-HCC) [T46.1X2A]    ICD (implantable cardioverter-defibrillator) in place [Z95.810]    Migraines [G43.909]    Asthma [J45.909]    Depression [F32.A]    Gastroesophageal reflux disease, unspecified whether esophagitis present [K21.9]    Irritable bowel syndrome with constipation [K58.1]      Resolved Hospital Problems   No resolved problems to display.       Additional Hospital Diagnoses ("rule out" or "suspected" diagnoses, etc.):              Major depressive disorder  History of ventricular fibrillation status post ICD placement  Migraines  Irritable bowel syndrome  GERD  Asthma    Principal Procedure During This Hospitalization (required):  None    Other Procedures Performed During This Hospitalization (required):  None    Procedure results are available in Chart Review in Epic.  For those providers external to [facility], the key procedure results are listed below:  None    Consultations Obtained During This Hospitalization:  Cardiology  Psychiatry  Toxicology    Key consultant recommendations:  Toxicology 07/22/22:  Pt is a 27 year old yo female with history of depression, headaches, tachycardia, PPM (2018) who presents with c/f verapamil ingestion. Time of ingestion approximately 2PM on 07/21/22. Overnight, she became hypotensive, not responsive to 1L fluid bolus, was started on treatment with IV calcium, epinephrine gtt, and HIET. Since then, she has been off epinephrine gtt since 8AM. She is on 0.5u/kr/hr insulin gtt and received calcium pushes prn. Reassuringly, her labwork is unremarkable including lactate. Her current vital signs are normal with MAPs in the 70-90s, HR remains at 60s. Her symptoms  are improving and she has no focal exam findings. As we are approaching 24 hours since her ingestion, it is reasonable to assess and see if can wean her off insulin at this point.      Recommendations:  - Around 1-2PM can either discontinue the insulin gtt or a quick wean (drop to 0.1u/kg/hr for 1-2 hrs and then discontinue)  ---- If patient's BP drops, then can restart insulin and give IV calcium pushes  - If she remains stable for the rest of the afternoon and evening off the insulin gtt, would recommend monitoring her overnight to ensure she continues to be HDS and can proceed with psychiatric evaluation    Psychiatry 07/22/22:  Assessment: 27 year old female with history of unspecified mood disorder admitted to ICU (2/24) for management of verapamil overdose.      Concerning impulsive suicide attempt with limited insight into events leading up to overdose. Once medically appropriate, requires inpatient psychiatric hospitalization for diagnostic clarification, disposition planning.      Recommendations:  - Disposition plan: pending medical stabilization  - Scheduled medications: discontinue venlafaxine; discontinue bupropion; start lurasidone 20 mg QPM  - PRN medications (for agitation/anxiety): none  - Medical clearance concerns: Toxicology recommendations  - Legal status: voluntary  - Level of observation: direct  - Outpatient provider not contacted     Reason for Admission to the Hospital / History of Present Illness:  Kristen Olson is a 27 year old female with MDD, VF s/p ICD, Migraines, IBS, GERD, Asthma who  presents for a calcium channel blocker overdose. Patient reports earlier yesterday she intentionally took roughly 12 extended release verapamil tablets. She told her friend she was going to do so and her friend called EMS. On arrival to the ED had few symptoms but later began to become hypotensive and bradycardic. Currently she is endorsing generalized head pressure and intermittent  episodes of hot flashes with diaphoresis. She denies SI currently and is happy that she is being admitted to the hospital. She lives with her husband and feels safe at home. Patient follows with [facility] psychiatry outpatient.     Hospital Course by Problem (required):  #Verapamil overdose  #Major depressive disorder  Patiented presented after verapamil ingestion around 07/21/22 2PM. She became hypotensive and was not fluid responsive. She was admitted to the ICU and was started on epinephrine gtt, HIET (1u/kg bolus followed by 1u/kg/hr infusion), and IV calium (target calcium level 15-17 or ical 53m), which were all discontinued when patient was transferred to the floor on 2/24. Patient continued to have borderline hypotension, but HR 70s-80s most recently (not paced). Toxicology consulted during admission. Verapamil should have metabolized by the time of [facility]ry consulted, recommended psych consult when medically ready as patient may require an inpatient psych admission.  - Monitor on telemetry  - Continue 1:1 sitter  - Continue home bupropion  - Continue home venlafaxine  - Start Latuda     #History of VF s/p ICD placement (Biotronik)  Found to be in paced rhythm earlier during admission. No longer paced by time of discharge. Spoke with general cardiology 2/25 who recommended electrophysiology consult 2/26 for further recommendations on medication regimen for VF. Biotronik rep contacted 2/25 for device interrogation. Per toxicology, no contraindication to restart home nadolol, but we continued to hold nadolol pending EP recommendations given patient's normal heart rates.   - Continue to hold home verapamil and nadolol pending EP recommendations  - EP consult  - Biotronik ICD interrogation (device rep planning to interrogate ICD on 2/25 evening. [facility]     #Migraines  Patient gets home eptinezumab injections as well as magnesium for prevention. Takes  rimegepant and lasmiditan for acute treatment. Follows with neurology outpatient.   - home magnesium oxide     #IBS   Takes docusate and prucalopride.   - miralax qday prn     #GERD  - home omeprazole     #Asthma  - home montelukast    Tests Outstanding at Discharge Requiring Follow Up:  There are no tests or other items that require follow up after hospital discharge.      Discharge Condition (required):  Stable.    Key Physical Exam Findings at Discharge:  Mental Status Exam: Patient is alert and oriented to person, place, time, and situation.  No significant physical examination findings at the time of discharge.    Gen: Alert and oriented, comfortably laying in bed  HEENT: PERRL, EOMI  CV: RRR, no murmurs, rubs, or gallops.  Resp: CTAB, normal work of breathing.  Abdomen: Soft, non-tender, non-distended.  Extremities: No lower extremity edema.  Neuro: Moving all extremities spontaneously, moving around in bed easily    Discharge Diet:   Gluten free .    [facility]    Discharge Code Status:  Full code / full care  This code status is not changed from the time of admission.    Discharge Medications:  What To Do With Your Medications        START taking these medications        Add'l Info   gabapentin 100 MG capsule  Commonly known as: NEURONTIN  Take 1 capsule (100 mg) by mouth every 8 hours as needed (Agitation/Anxiety).   Quantity: 90 capsule  Refills: 0     lurasidone 20 MG Tabs  Commonly known as: LATUDA  Take 1 tablet (20 mg) by mouth every evening. Administer with food (at least 350 calories)  Start taking on: July 24, 2022   Quantity: 60 tablet  Refills: MDD     norethindrone 0.35 MG tablet  Commonly known as: ORTHO MICRONOR  Take 1 tablet by mouth daily.  Start taking on: July 24, 2022   Quantity: 364 tablet  Refills: 0     polyethylene glycol 17 GM/SCOOP powder  Commonly known as: GLYCOLAX  Take 17 g by mouth daily. Mix with 4  to 8 ounces of fluid (water, juice, soda, coffee, or tea) prior to administration.   Quantity: 1 each  Refills: 0            CHANGE how you take these medications        Add'l Info   buPROPion 150 MG XL tablet  Commonly known as: WELLBUTRIN XL  Take 1 tablet (150 mg) by mouth daily.  Start taking on: July 24, 2022   Quantity: 30 tablet  Refills: 0  What changed:   medication strength  how much to take            CONTINUE taking these medications        Add'l Info   Ajovy injection  Inject 1.5 mL (225 mg) under the skin every 30 days.  Generic drug: fremanezumab-vfrm   Quantity: 4.5 mL  Refills: 3     docusate sodium 250 MG capsule  Commonly known as: COLACE  TAKE 1 CAPSULE (250 MG) BY MOUTH 2 TIMES DAILY   Quantity: 60 capsule  Refills: 2     eptinezumab-jjmr 100 MG/ML Soln  Commonly known as: VYEPTI  Inject 1 mL (100 mg) into vein Every 3 months.   Quantity: 1.12 mL  Refills: 3     lasmiditan 100 MG tablet  Commonly known as: REYVOW  Take 1 tablet (100 mg) by mouth as needed for Migraine.   Quantity: 8 tablet  Refills: 5     Magnesium Oxide -Mg Supplement 500 MG Caps  TAKE 1 CAPSULE BY MOUTH EVERY DAY   Quantity: 30 capsule  Refills: 11     montelukast 10 MG tablet  Commonly known as: SINGULAIR  Take 1 tablet (10 mg) by mouth every evening.   Refills: 0     Motegrity 2 MG Tabs  TAKE 1 TABLET (2 MG) BY MOUTH DAILY  Generic drug: Prucalopride Succinate   Quantity: 30 each  Refills: 2     Nurtec tablet  Take 1 tablet as needed for migraine. No more than 1 tablet per 24 hrs  Generic drug: rimegepant   Quantity: 8 tablet  Refills: 5     omeprazole 20 MG capsule  Commonly known as: PRILOSEC   Refills: 0     venlafaxine XR 75 MG capsule  Commonly known as: EFFEXOR XR  Take 1 capsule (75 mg) by mouth daily.   Refills: 0            STOP taking these medications      dexAMETHasone 4 MG tablet  Commonly known as: DECADRON     escitalopram 10 MG tablet  Commonly known as: LEXAPRO     nadolol 20 MG tablet  Commonly known  as: CORGARD     verapamil 180 MG SR tablet  Commonly known as: CALAN SR               Where to Get Your Medications        Please check with staff for printed prescription or if prescription was faxed to your pharmacy.    Bring a paper prescription for each of these medications  buPROPion 150 MG XL tablet  gabapentin 100 MG capsule  lurasidone 20 MG Tabs  norethindrone 0.35 MG tablet  polyethylene glycol 17 GM/SCOOP powder         Allergies:  Allergies   Allergen Reactions    Gluten Meal Anaphylaxis       Follow Up Appointments:    Already Scheduled:  No future appointments.      Certain appointment types may not appear in this section. Refer to the Post Discharge Referrals section of the After Visit Summary for further information.    Discharging 66 Contact Information:  [facility] operator at [phone].    I have evaluated the patient today; she will be discharged from the hospital today.     Today, her physical examination is notable for lying in bed in NAD, EOMI, clear OP, heart RRR no mgr, lungs CTAB, normal wob, abd soft nt nd +BS, no LEE.    Please refer to the Discharge Summary for further details.    I spent 20 minutes on the patient's care unit in the preparation and execution of the hospital discharge.      

## 2022-07-26 ENCOUNTER — Other Ambulatory Visit: Payer: Self-pay

## 2022-08-15 ENCOUNTER — Encounter (INDEPENDENT_AMBULATORY_CARE_PROVIDER_SITE_OTHER): Payer: Self-pay

## 2022-09-14 ENCOUNTER — Ambulatory Visit (INDEPENDENT_AMBULATORY_CARE_PROVIDER_SITE_OTHER): Payer: 59 | Admitting: Rehabilitative and Restorative Service Providers"

## 2022-09-18 NOTE — Interdisciplinary (Signed)
Physical Therapy Evaluation    Ordering Physician Miki Kins T.    Diagnosis     ICD-10-CM ICD-9-CM    1. Chronic low back pain without sciatica, unspecified back pain laterality  M54.50 724.2     G89.29 338.29           Preferred Language:English    Start of Service  Start of Care: 09/14/22  Reason for referral: Activity tolerance limitation;Pain;Postural dysfunction;Range of motion limitation;Strength impairment;Decline in functional mobility                   Past Medical History:   Diagnosis Date    Depression     Migraine     Spinal lordosis     Tachycardia     Hx Tachycardia, coronary artery spasms - sees cardiologist, on meds. Defibrillator placed 09/2017.      Past Surgical History:   Procedure Laterality Date    CARDIAC DEFIBRILLATOR PLACEMENT  09/2017          Physical Therapy Evaluation       Row Name 09/18/22 0600          Medical History    History of presenting condition Pt with chronic LBP, now referred here for PT     Previous treatment for condition Conservative management     Mechanism of Injury Insidious onset     Past medical/surgical history affecting therapy  Cardiac history;Pacemaker;Obesity;Hypertension     Reason for referral Activity tolerance limitation;Pain;Postural dysfunction;Range of motion limitation;Strength impairment;Decline in functional mobility       Row Name 09/18/22 0600          Pain Assessment    Pain Asssessment Tool Numeric Pain Rating Scale       Row Name 09/18/22 0600          Numeric Pain Rating Scale     Pain Intensity - rating at present 4     Pain Intensity - rating at worst  8     Description  Aching;Throbbing;Lambert Mody       Row Name 09/18/22 0600          Pain Characteristics    Exacerbating Factors Activity - worse while   standing;lifting;straining;sitting;completing repetitive movements       Row Name 09/18/22 0600          Objective Findings    Objective Findings see LB eval                         Lumbar Spine      Flowsheet Row Most Recent Value    Lumbar Spine Observation    Posture Lumbar lordosis increased, Rounded shoulders bilaterally, Thoracic kyphosis   Tenderness  Lumbar spine paraspinals bilaterally   Lumbar Spine Flexion    AROM deficit 50%   Lumbar Spine Extension    AROM deficit 50%   Lumbar Spine Special Tests Right    Slump test - Right Negative   Straight leg raise - Right Negative   Lumbar Spine Special Tests Left    Straight leg raise - Left Negative   Straight leg raise - Right Negative   Lumbar Spine Functional Tests Right    Stand on Heels - Left L4/5 Negative   Stand on Toes - Left S1/2 Negative   Lumbar Spine Functional Tests Left    Stand on Heels - Right Negative   Stand on Toes - Right Negative   Lumabr Spine Other Findings Neuro screen=  WNL, poor core strength, diff with trasnsfers , limited standing/ walking tol.              Ankle Assessment      Flowsheet Row Most Recent Value   Right Lower Extremity Functional Tests    Stand on Heels - Right Negative   Stand on Toes - Right Negative   Left Lower Extremity Functional Tests    Stand on Heels - Left L4/5 Negative   Stand on Toes - Left S1/2 Negative          Knee Assessment      Flowsheet Row Most Recent Value   Right Lower Extremity Functional Tests    Stand on Heels - Right Negative   Stand on Toes - Right Negative   Left Lower Extremity Functional Tests    Stand on Heels - Left L4/5 Negative   Stand on Toes - Left S1/2 Negative                     -       Row Name 09/18/22 0600          Assessment     Assessment  Pt with chronic LBP due to lack of fitness, core strength and obesity, pt may benefit from a good/ consistent fitness program and LE/ core strength     Rehab Potential Fair       Row Name 09/18/22 0600          Patient stated Goal    Patient stated goal dec pain       Row Name 09/18/22 0600          GOAL 1 (Short Term)    Impairment Education     Education Patient able to return demonstrate Home Exercise Program independently to enable patient to achieve stated  functional goal     No. visits 1-3     Goal status New       Row Name 09/18/22 0600          Outpatient Treatment Plan    OP Treatment Frequency 1 time per week     OP Treatment Duration 6 weeks       Row Name 09/18/22 0600          Treatment Plan Discussion    Treatment Plan Discussion & Agreement Patient;Spouse/partner       Row Name 09/18/22 0600          Focus for Next Treatment    Focus for Next Treatment Therapeutic exercise;Strengthening;Skilled techniques to improve range of motion;Skilled techniques to improve muscle and tissue flexibility;Patient exercise program instruction;Neuromuscular re-education;Modalities;Manual therapy       Row Name 09/18/22 0600          Type of Eval    Moderate Complexity [phone]) Completed       Row Name 09/18/22 0600          Therapeutic Procedures    Manual therapy (97140)  Trigger point release;Soft tissue mobilization       Manual therapy  LB paras/ QL     Therapeutic exercise  (13086)  Patient education;Home Exercise Program (HEP) demonstration and performance;Flexibility exercises;Range of motion exercises;See exercise/activity grid;Spinal stabilization exercises        Total TIMED Treatment (min)  45       Therapeutic exercise   issued HEP       Row Name 09/18/22 0600          Treatment Time  Total TIMED Treatment  (min) 60                       The physical therapist of record is endorsed by evaluating physical therapist.

## 2022-11-20 ENCOUNTER — Telehealth (INDEPENDENT_AMBULATORY_CARE_PROVIDER_SITE_OTHER): Admitting: Physician Assistant

## 2022-11-20 ENCOUNTER — Encounter (INDEPENDENT_AMBULATORY_CARE_PROVIDER_SITE_OTHER): Payer: Self-pay | Admitting: Physician Assistant

## 2022-11-20 MED ORDER — NADOLOL 20 MG OR TABS
ORAL_TABLET | ORAL | Status: DC
Start: ? — End: 2022-11-20

## 2022-11-20 MED ORDER — NADOLOL 40 MG OR TABS
40.0000 mg | ORAL_TABLET | Freq: Every day | ORAL | 0 refills | Status: DC
Start: 2022-11-20 — End: 2022-11-20

## 2022-11-20 MED ORDER — ONE-A-DAY ESSENTIAL OR TABS: 1.00 | ORAL_TABLET | Freq: Every day | ORAL | Status: AC

## 2022-11-20 MED ORDER — BUPROPION XL (DAILY) 300 MG OR TB24
300.0000 mg | ORAL_TABLET | Freq: Every morning | ORAL | 0 refills | Status: AC
Start: 2022-11-20 — End: ?

## 2022-11-20 MED ORDER — HYDROXYZINE HCL 10 MG OR TABS
1.00 | ORAL_TABLET | Freq: Three times a day (TID) | ORAL | Status: AC | PRN
Start: 2022-03-08 — End: 2024-03-07

## 2022-11-20 MED ORDER — VENLAFAXINE HCL 75 MG OR CP24: 75.0000 mg | ORAL_CAPSULE | Freq: Every day | ORAL | 0 refills | Status: AC

## 2022-11-20 MED ORDER — NADOLOL 40 MG OR TABS
40.0000 mg | ORAL_TABLET | Freq: Every day | ORAL | 0 refills | Status: DC
Start: 2022-11-20 — End: 2022-12-22

## 2022-11-20 MED ORDER — VENLAFAXINE HCL 150 MG OR CP24
150.0000 mg | ORAL_CAPSULE | Freq: Every day | ORAL | 0 refills | Status: AC
Start: 2022-11-20 — End: ?

## 2022-11-20 MED ORDER — VERAPAMIL HCL SR (BID) 180 MG OR TBCR
180.0000 mg | EXTENDED_RELEASE_TABLET | Freq: Every day | ORAL | 0 refills | Status: DC
Start: 2022-11-20 — End: 2022-11-27

## 2022-11-20 MED ORDER — ALBUTEROL SULFATE 108 (90 BASE) MCG/ACT IN AERS
INHALATION_SPRAY | RESPIRATORY_TRACT | Status: DC
Start: 2022-10-15 — End: 2022-11-20

## 2022-11-20 MED ORDER — VERAPAMIL HCL SR (BID) 180 MG OR TBCR
180.0000 mg | EXTENDED_RELEASE_TABLET | Freq: Every day | ORAL | 0 refills | Status: DC
Start: 2022-11-20 — End: 2022-11-20

## 2022-11-20 MED ORDER — ALBUTEROL SULFATE 108 (90 BASE) MCG/ACT IN AERS
1.0000 | INHALATION_SPRAY | Freq: Four times a day (QID) | RESPIRATORY_TRACT | 0 refills | Status: AC | PRN
Start: 2022-11-20 — End: ?

## 2022-11-20 MED ORDER — MIRTAZAPINE 15 MG OR TABS
7.50 mg | ORAL_TABLET | ORAL | Status: DC
Start: 2022-08-02 — End: 2022-12-20

## 2022-11-20 MED ORDER — VERAPAMIL HCL SR (BID) 180 MG OR TBCR
180.00 mg | EXTENDED_RELEASE_TABLET | Freq: Every day | ORAL | Status: DC
Start: ? — End: 2022-11-20

## 2022-11-20 NOTE — Progress Notes (Signed)
[facility] Video Visit    Interactive real time audio and visual communication used for the telehealth visit.   HPI   Kristen Olson is a 27 year old female with history of anxiety, depression, SI attempt, chronic migraine, arrythmia, pacemaker who presents to clinic for establish care.     Kristen Olson is a 27 year old female with a history of depression, anxiety, chronic migraine headaches refractory to various treatments.     #migraine HA - chronic, She has tried multiple triptans in the past as well as many over the counter medications, with either no improvement or side effects. She repotrs best relief with nurtec and especially Reyvow. Patient previously seen at [facility] prior to switching to [facility] 11/2021. She was previously being seen at [facility] before her work switched insurance.     #cardiac arrhythmia - She has a AICD placed a few years ago due to vasospams?POTS? On verampil and nadolol. She reports some feelings of arrhythmia/palpitations since her overdose in February. She overdosed on CCB. She is currently in outpatient mental health program and was cleared by psychiatrist to continue taking her medications. She is requesting referral to cardiology.      #Allergies - triggers asthma attacks due to allergy to dust, uses albuterol inhaler prn     #Depression/anxiety - long standing history of depression and anxiety with OCD. Saw psychiatry in the past, currently is in outpatient mental health program and history of recent SI attempt by overdose of CCB. She denies any current feelings of SI/HI. She feels safe at home.     In regards to patient's screenings and immunizations - she had annual at [facility] within the last 6 months.     Denies any tobacco use, marijuana use, illicit drug use, or alcohol use.       REVIEW OF SYSTEMS    Negative except for those in HPI      PAST MEDICAL AND SURGICAL HISTORY:    has a past surgical history that includes Cardiac defibrillator  placement (09/2017).  Past Medical History:   Diagnosis Date    Depression     Migraine     Spinal lordosis     Tachycardia     Hx Tachycardia, coronary artery spasms - sees cardiologist, on meds. Defibrillator placed 09/2017.        CURRENT MEDICATIONS:    Current Outpatient Medications:     albuterol 108 (90 Base) MCG/ACT inhaler, Inhale 1 puff by mouth every 6 hours as needed for Wheezing., Disp: 6.7 g, Rfl: 0    buPROPion (WELLBUTRIN XL) 150 MG XL tablet, Take 1 tablet (150 mg) by mouth daily. (Patient taking differently: Take 2 tablets (300 mg) by mouth daily.), Disp: 30 tablet, Rfl: 0    buPROPion (WELLBUTRIN XL) 300 MG XL tablet, Take 1 tablet (300 mg) by mouth every morning., Disp: 90 tablet, Rfl: 0    docusate sodium (COLACE) 250 MG capsule, TAKE 1 CAPSULE (250 MG) BY MOUTH 2 TIMES DAILY, Disp: 60 capsule, Rfl: 2    eptinezumab-jjmr (VYEPTI) 100 MG/ML SOLN, Inject 1 mL (100 mg) into vein Every 3 months., Disp: 1.12 mL, Rfl: 3    fremanezumab-vfrm (AJOVY) injection, Inject 1.5 mL (225 mg) under the skin every 30 days., Disp: 4.5 mL, Rfl: 3    hydrOXYzine HCL (ATARAX) 10 MG tablet, Take 1 tablet (10 mg) by mouth every 8 hours as needed., Disp: , Rfl:     lasmiditan (REYVOW) 100 MG tablet, Take  1 tablet (100 mg) by mouth as needed for Migraine., Disp: 8 tablet, Rfl: 5    lurasidone (LATUDA) 20 MG TABS, Take 1 tablet (20 mg) by mouth every evening. Administer with food (at least 350 calories), Disp: 60 tablet, Rfl: MDD    Magnesium Oxide 500 MG CAPS, TAKE 1 CAPSULE BY MOUTH EVERY DAY, Disp: 30 capsule, Rfl: 11    mirtazapine (REMERON) 15 MG tablet, Take 0.5 tablets (7.5 mg) by mouth., Disp: , Rfl:     montelukast (SINGULAIR) 10 MG tablet, Take 1 tablet (10 mg) by mouth every evening., Disp: , Rfl:     MOTEGRITY 2 MG TABS, TAKE 1 TABLET (2 MG) BY MOUTH DAILY, Disp: 30 each, Rfl: 2    Multiple Vitamin (ONE-A-DAY ESSENTIAL) TABS, Take 1 tablet by mouth daily., Disp: , Rfl:     nadolol (CORGARD) 40 MG tablet, Take 1  tablet (40 mg) by mouth daily., Disp: 30 tablet, Rfl: 0    norethindrone (ORTHO MICRONOR) 0.35 MG tablet, Take 1 tablet by mouth daily., Disp: 364 tablet, Rfl: 0    omeprazole (PRILOSEC) 20 MG capsule, , Disp: , Rfl: 0    polyethylene glycol (GLYCOLAX) 17 GM/SCOOP powder, Take 17 g by mouth daily. Mix with 4 to 8 ounces of fluid (water, juice, soda, coffee, or tea) prior to administration., Disp: 1 each, Rfl: 0    rimegepant (NURTEC) tablet, Take 1 tablet as needed for migraine. No more than 1 tablet per 24 hrs, Disp: 8 tablet, Rfl: 5    venlafaxine XR (EFFEXOR XR) 150 MG capsule, Take 1 capsule (150 mg) by mouth daily., Disp: 90 capsule, Rfl: 0    venlafaxine XR (EFFEXOR XR) 75 MG capsule, Take 1 capsule (75 mg) by mouth daily., Disp: 90 capsule, Rfl: 0    verapamil (CALAN SR) 180 MG SR tablet, Take 1 tablet (180 mg) by mouth daily., Disp: 7 tablet, Rfl: 0     ALLERGIES:  Gluten meal    FAMILY HISTORY:  family history includes Cholesterol/Lipid Disorder in her mother; Heart Disease in her mother.    SOCIAL HISTORY:    reports that she has never smoked. She has never used smokeless tobacco. She reports that she does not currently use alcohol. She reports that she does not use drugs.     PHYSICAL EXAMINATION    There were no vitals filed for this visit.  There is no height or weight on file to calculate BMI.  Wt Readings from Last 5 Encounters:   07/22/22 95.1 kg (209 lb 10.5 oz)   10/10/21 93 kg (205 lb)   09/16/21 92.1 kg (203 lb)   06/09/21 90.8 kg (200 lb 3.2 oz)   01/26/21 73 kg (160 lb 15 oz)     Blood Pressure   07/23/22 (!) 116/53   10/10/21 108/71   06/09/21 90/62   03/25/18 100/68   05/08/14 121/67       GEN: in NAD, A&O, pleasant, non-toxic appearing  EENT: No injection or icterus.   Psych: Alert and Oriented. Affect appropriate.       ASSESSMENT AND PLAN    1. Encounter to establish care  -Reviewed and updated medical history.   -Addressed issues and complaints.     Healthcare Maintenance:   -Discussed  important of healthy eating, daily exercise, skin cancer prevention, and use of sunscreen, decreasing screen time (for eye health), and wearing a seatbelt.     2. Pacemaker  - Obtain recent cardiology records from [facility]  -  Place a stat referral to cardiology for evaluation and management  - Monitor for worsening symptoms and advise the patient to report any changes. ER precautions discussed.   - Consult/Referral to Cardiology Clinic  - verapamil (CALAN SR) 180 MG SR tablet; Take 1 tablet (180 mg) by mouth daily.  Dispense: 7 tablet; Refill: 0    3. Depression, unspecified depression type  - Continue current medications: Wellbutrin 300mg  daily, Remeron as needed, and Effexor  - Refer to in-house psychiatry for medication review and management due to potential interactions  - Encourage continued participation in the outpatient psychiatric treatment program at [facility]  - venlafaxine XR (EFFEXOR XR) 75 MG capsule; Take 1 capsule (75 mg) by mouth daily.  Dispense: 90 capsule; Refill: 0  - venlafaxine XR (EFFEXOR XR) 150 MG capsule; Take 1 capsule (150 mg) by mouth daily.  Dispense: 90 capsule; Refill: 0  - Consult/Referral to [facility]    4. Cardiac arrhythmia, unspecified cardiac arrhythmia type  Sent 7 tabs of CCB and 30 of beta blocker. Patient had recent SI attempt on CCB. Do not feel comfortable prescribing additional tabs at this time until patient follows up with psychiatry. Although medication medically necessary given history of cardiac vasospasms.   - Consult/Referral to Cardiology Clinic  - nadolol (CORGARD) 40 MG tablet; Take 1 tablet (40 mg) by mouth daily.  Dispense: 90 tablet; Refill: 0  - verapamil (CALAN SR) 180 MG SR tablet; Take 1 tablet (180 mg) by mouth daily.  Dispense: 7 tablet; Refill: 0    5. Extrinsic asthma, unspecified asthma severity, unspecified whether complicated, unspecified whether persistent    - albuterol 108 (90 Base) MCG/ACT inhaler; Inhale 1 puff by mouth every 6 hours  as needed for Wheezing.  Dispense: 6.7 g; Refill: 0    6. Irritable bowel syndrome, unspecified type    - Gastroenterology Clinic    7. Obsessive-compulsive disorder, unspecified type  Stable. Continue medication as prescribed.   - buPROPion (WELLBUTRIN XL) 300 MG XL tablet; Take 1 tablet (300 mg) by mouth every morning.  Dispense: 90 tablet; Refill: 0    8. Generalized anxiety disorder  Currently in outpatient psych program. Feels stable on current dose. Psychiatrist at facility encourage patient to continue medication as prescribed previously.   Sent refill to pharmacy.   Patient will follow up with psychiatry.   - buPROPion (WELLBUTRIN XL) 300 MG XL tablet; Take 1 tablet (300 mg) by mouth every morning.  Dispense: 90 tablet; Refill: 0  - Consult/Referral to [facility]    9. Intractable chronic migraine without aura and without status migrainosus  Continue nurtec. Follow up with neurology.   - Consult/Referral to Neurology      Patient understands and is agreeable to the treatment plan.  All  questions have been answered thoroughly.    There are no Patient Instructions on file for this visit.    FOLLOW UP   Return in about 1 week (around 11/27/2022) for Follow Up.    Nathanial Millman, PA-C

## 2022-11-21 ENCOUNTER — Telehealth (INDEPENDENT_AMBULATORY_CARE_PROVIDER_SITE_OTHER): Admitting: Physician Assistant

## 2022-11-27 ENCOUNTER — Encounter (INDEPENDENT_AMBULATORY_CARE_PROVIDER_SITE_OTHER): Payer: Self-pay | Admitting: Physician Assistant

## 2022-11-27 ENCOUNTER — Telehealth: Admitting: Physician Assistant

## 2022-11-27 MED ORDER — VERAPAMIL HCL SR (BID) 180 MG OR TBCR
180.0000 mg | EXTENDED_RELEASE_TABLET | Freq: Every day | ORAL | 0 refills | Status: DC
Start: 2022-11-27 — End: 2022-12-22

## 2022-11-27 NOTE — Progress Notes (Signed)
[facility] Video Visit    Interactive real time audio and visual communication used for the telehealth visit.   HPI   Kristen Olson is a 27 year old female with history of anxiety, depression, SI attempt, chronic migraine, arrythmia, pacemaker who presents to clinic for follow up.     Patient who established care last week is following up for medication refill. She takes verapamil 180mg  for cardiac arrythemia and palpitations. Earlier this year, had attempted OD with CCB and is currently in IOP for anxiety and depression. She had her program extended and is there for another 2 weeks. She reports feeling ok with her medications, and has no feelings of SI/HI. She has appointment with psychiatry [facility]) tomorrow. She will be following up with cardiology as well.     She lives at home with husband and dog and feels safe at home.       REVIEW OF SYSTEMS    Negative except for those in HPI      PAST MEDICAL AND SURGICAL HISTORY:    has a past surgical history that includes Cardiac defibrillator placement (09/2017).  Past Medical History:   Diagnosis Date    Depression     Migraine     Spinal lordosis     Tachycardia     Hx Tachycardia, coronary artery spasms - sees cardiologist, on meds. Defibrillator placed 09/2017.        CURRENT MEDICATIONS:    Current Outpatient Medications:     albuterol 108 (90 Base) MCG/ACT inhaler, Inhale 1 puff by mouth every 6 hours as needed for Wheezing., Disp: 6.7 g, Rfl: 0    buPROPion (WELLBUTRIN XL) 150 MG XL tablet, Take 1 tablet (150 mg) by mouth daily. (Patient taking differently: Take 2 tablets (300 mg) by mouth daily.), Disp: 30 tablet, Rfl: 0    buPROPion (WELLBUTRIN XL) 300 MG XL tablet, Take 1 tablet (300 mg) by mouth every morning., Disp: 90 tablet, Rfl: 0    docusate sodium (COLACE) 250 MG capsule, TAKE 1 CAPSULE (250 MG) BY MOUTH 2 TIMES DAILY, Disp: 60 capsule, Rfl: 2    eptinezumab-jjmr (VYEPTI) 100 MG/ML SOLN, Inject 1 mL (100 mg) into vein Every 3  months., Disp: 1.12 mL, Rfl: 3    fremanezumab-vfrm (AJOVY) injection, Inject 1.5 mL (225 mg) under the skin every 30 days., Disp: 4.5 mL, Rfl: 3    hydrOXYzine HCL (ATARAX) 10 MG tablet, Take 1 tablet (10 mg) by mouth every 8 hours as needed., Disp: , Rfl:     lasmiditan (REYVOW) 100 MG tablet, Take 1 tablet (100 mg) by mouth as needed for Migraine., Disp: 8 tablet, Rfl: 5    lurasidone (LATUDA) 20 MG TABS, Take 1 tablet (20 mg) by mouth every evening. Administer with food (at least 350 calories), Disp: 60 tablet, Rfl: MDD    Magnesium Oxide 500 MG CAPS, TAKE 1 CAPSULE BY MOUTH EVERY DAY, Disp: 30 capsule, Rfl: 11    mirtazapine (REMERON) 15 MG tablet, Take 0.5 tablets (7.5 mg) by mouth., Disp: , Rfl:     montelukast (SINGULAIR) 10 MG tablet, Take 1 tablet (10 mg) by mouth every evening., Disp: , Rfl:     MOTEGRITY 2 MG TABS, TAKE 1 TABLET (2 MG) BY MOUTH DAILY, Disp: 30 each, Rfl: 2    Multiple Vitamin (ONE-A-DAY ESSENTIAL) TABS, Take 1 tablet by mouth daily., Disp: , Rfl:     nadolol (CORGARD) 40 MG tablet, Take 1 tablet (40 mg) by mouth  daily., Disp: 30 tablet, Rfl: 0    norethindrone (ORTHO MICRONOR) 0.35 MG tablet, Take 1 tablet by mouth daily., Disp: 364 tablet, Rfl: 0    omeprazole (PRILOSEC) 20 MG capsule, , Disp: , Rfl: 0    polyethylene glycol (GLYCOLAX) 17 GM/SCOOP powder, Take 17 g by mouth daily. Mix with 4 to 8 ounces of fluid (water, juice, soda, coffee, or tea) prior to administration., Disp: 1 each, Rfl: 0    rimegepant (NURTEC) tablet, Take 1 tablet as needed for migraine. No more than 1 tablet per 24 hrs, Disp: 8 tablet, Rfl: 5    venlafaxine XR (EFFEXOR XR) 150 MG capsule, Take 1 capsule (150 mg) by mouth daily., Disp: 90 capsule, Rfl: 0    venlafaxine XR (EFFEXOR XR) 75 MG capsule, Take 1 capsule (75 mg) by mouth daily., Disp: 90 capsule, Rfl: 0    verapamil (CALAN SR) 180 MG SR tablet, Take 1 tablet (180 mg) by mouth daily., Disp: 30 tablet, Rfl: 0     ALLERGIES:  Gluten meal    FAMILY  HISTORY:  family history includes Cholesterol/Lipid Disorder in her mother; Heart Disease in her mother.    SOCIAL HISTORY:    reports that she has never smoked. She has never used smokeless tobacco. She reports that she does not currently use alcohol. She reports that she does not use drugs.     PHYSICAL EXAMINATION    There were no vitals filed for this visit.  There is no height or weight on file to calculate BMI.  Wt Readings from Last 5 Encounters:   07/22/22 95.1 kg (209 lb 10.5 oz)   10/10/21 93 kg (205 lb)   09/16/21 92.1 kg (203 lb)   06/09/21 90.8 kg (200 lb 3.2 oz)   01/26/21 73 kg (160 lb 15 oz)     Blood Pressure   07/23/22 (!) 116/53   10/10/21 108/71   06/09/21 90/62   03/25/18 100/68   05/08/14 121/67       GEN: in NAD, A&O, pleasant, non-toxic appearing  EENT: No injection or icterus.   Psych: Alert and Oriented. Affect appropriate.       ASSESSMENT AND PLAN    1. Pacemaker  - The patient is currently on a calcium channel blocker, which she had OD attempt on in March. Feeling better and more stable mentally. OK to refill medication. In IOP program and good support at home. Denies feelings of SI.   - Plan to send in a month's worth of the medication.  - The patient has an upcoming appointment with cardiology; they may take over the prescription management.  - Encourage the patient to continue with short walks as a form of exercise.  - verapamil (CALAN SR) 180 MG SR tablet; Take 1 tablet (180 mg) by mouth daily.  Dispense: 30 tablet; Refill: 0    2. Cardiac arrhythmia, unspecified cardiac arrhythmia type    - verapamil (CALAN SR) 180 MG SR tablet; Take 1 tablet (180 mg) by mouth daily.  Dispense: 30 tablet; Refill: 0    Patient understands and is agreeable to the treatment plan.  All  questions have been answered thoroughly.    There are no Patient Instructions on file for this visit.    FOLLOW UP   Return in about 4 weeks (around 12/25/2022) for Follow Up.    Nathanial Millman, PA-C

## 2022-12-13 ENCOUNTER — Encounter (INDEPENDENT_AMBULATORY_CARE_PROVIDER_SITE_OTHER): Payer: Self-pay | Admitting: Physician Assistant

## 2022-12-15 ENCOUNTER — Encounter (INDEPENDENT_AMBULATORY_CARE_PROVIDER_SITE_OTHER): Payer: Self-pay | Admitting: Physician Assistant

## 2022-12-15 ENCOUNTER — Encounter (INDEPENDENT_AMBULATORY_CARE_PROVIDER_SITE_OTHER): Payer: Self-pay | Admitting: Hospital

## 2022-12-15 DIAGNOSIS — R635 Abnormal weight gain: Secondary | ICD-10-CM

## 2022-12-15 DIAGNOSIS — Z6839 Body mass index (BMI) 39.0-39.9, adult: Secondary | ICD-10-CM

## 2022-12-15 NOTE — Progress Notes (Signed)
Sending to weight management for pharmacologic options

## 2022-12-20 ENCOUNTER — Encounter (INDEPENDENT_AMBULATORY_CARE_PROVIDER_SITE_OTHER): Payer: Self-pay | Admitting: Physician Assistant

## 2022-12-20 ENCOUNTER — Ambulatory Visit (INDEPENDENT_AMBULATORY_CARE_PROVIDER_SITE_OTHER): Admitting: Physician Assistant

## 2022-12-20 ENCOUNTER — Telehealth (INDEPENDENT_AMBULATORY_CARE_PROVIDER_SITE_OTHER): Payer: Self-pay | Admitting: Physician Assistant

## 2022-12-20 MED ORDER — LAMOTRIGINE 25 MG OR TABS
ORAL_TABLET | ORAL | Status: DC
Start: 2022-11-28 — End: 2023-04-04

## 2022-12-20 MED ORDER — SEMAGLUTIDE-WEIGHT MANAGEMENT 0.25 MG/0.5ML SC SOAJ
0.2500 mg | SUBCUTANEOUS | 0 refills | Status: AC
Start: 2022-12-20 — End: 2023-01-17

## 2022-12-20 NOTE — Progress Notes (Signed)
[facility] Wellness Visit Note  VIDEO VISIT: This service is being conducted by interactive realtime audio and video communication.  I have personally performed a full and complete verbal consent of this patient via telephone prior to proceeding with this evaluation.      Note: I am providing obesity and metabolic medicine management for this patient.  I am not assuming primary care and the patient is aware they should continue to follow up with their PCP for all general medical concerns and health care maintenance.     HPI  Kristen Olson is a 27 year old female being seen virtually today for the [facility] and Weight Management Program.    Patient's current BMI is Body mass index is 41.57 kg/m., categorizing them as  Morbidly obese .    Patient has a goal of achieving a normal BMI [18.5-24.9] to improve quality of life and reduce obesity-related complications.       Date of last primary care visit: 11/27/2022  New to [facility]/Weight management.     Weight-related history:     Age of significant weight gain and rate/circumstance of weight gain:   She states that she developed obesity in adulthood. She has been referred to different weight management programs, but has been successful at losing weight. She has gained weight due to medication use. Most recently Remeron which has been discontinued.   She has a history of cardiac arrest. She had ICD placed 2019.     Patient has attempted several trials of lifestyle interventions including food intake, exercise, and behavior changes in the past 6 months with minimal success.    Weight loss programs previously or currently trying:     + Weight Watchers  + Meal replacements  + Food tracking/dieting apps    Weight-related medications previously tried or contraindicated:   Phentermine - CI due to cardiac disease. Pt has ICD in place for management ventricular arrhythmia   + Topiramate used for migraines in 2014 for 1 year,  caused side effects so it  was discontinued  Qsymia - CI due to cardiac disease   Wellbutrin - already using medication, ineffective for weight loss  Contrave - using separate rx for Wellbutrin, ineffective for weight loss     History of or interest in bariatric surgeries: No    Recent weights [Today's weight self reported per patient]:  Wt Readings from Last 3 Encounters:   12/20/22 99.8 kg (220 lb)   07/22/22 95.1 kg (209 lb 10.5 oz)   10/10/21 93 kg (205 lb)         12/20/2022     8:15 AM 07/22/2022     7:40 AM 10/10/2021     6:51 AM 09/16/2021    10:18 AM 06/09/2021     3:23 PM   Date BMI Recorded   BMI 41.57 kg/m2 39.61 kg/m2 38.73 kg/m2 38.36 kg/m2 37.83 kg/m2        Highest weight was in 2024: 220 lbs   Goal Weight: 170 lbs   Other metrics of health success or progress: lower blood sugar, disease prevention     Medical history:   Past Medical History:   Diagnosis Date    Depression     Migraine     Spinal lordosis     Tachycardia     Hx Tachycardia, coronary artery spasms - sees cardiologist, on meds. Defibrillator placed 09/2017.         Lifestyle history:   Diet/eating patterns: not currently tracking,  but eats small portions. Yogurt, granola, sandwich or salad for lunch, protein with rice for dinner.   Exercise/physical activity: Jogging 30-40 mins 4-5X a week. Has a dog she walks .   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    Contraception if female and child-bearing age: pill    Additional family or personal history related to weight-loss medication options and contraindications:   Reports cardiac arrhythmias, glaucoma, substance abuse, kidney stones/disease, uncontrolled HTN, MAOI use in past 2 weeks, cerebrovascular disease, seizure hx and hyperthyroidism.   Denies history of pancreatitis or gallbladder disease,.  Denies any known personal or family history of thyroid  cancer or MEN2.    PAST MEDICAL HISTORY:  Past Medical History:   Diagnosis Date    Depression     Migraine     Spinal lordosis     Tachycardia     Hx Tachycardia, coronary artery spasms - sees cardiologist, on meds. Defibrillator placed 09/2017.       PAST SURGICAL HISTORY:   has a past surgical history that includes Cardiac defibrillator placement (09/2017).    CURRENT MEDICATIONS:     Current Outpatient Medications:     albuterol 108 (90 Base) MCG/ACT inhaler, Inhale 1 puff by mouth every 6 hours as needed for Wheezing., Disp: 6.7 g, Rfl: 0    buPROPion (WELLBUTRIN XL) 300 MG XL tablet, Take 1 tablet (300 mg) by mouth every morning., Disp: 90 tablet, Rfl: 0    docusate sodium (COLACE) 250 MG capsule, TAKE 1 CAPSULE (250 MG) BY MOUTH 2 TIMES DAILY, Disp: 60 capsule, Rfl: 2    hydrOXYzine HCL (ATARAX) 10 MG tablet, Take 1 tablet (10 mg) by mouth every 8 hours as needed., Disp: , Rfl:     lamoTRIgine (LAMICTAL) 25 MG tablet, TAKE 1 TABLET BY MOUTH IN THE MORNING FOR 2 WEEKS. IF TOLERATED INCREASE TO 2 TABLETS IN THE MORNING, Disp: , Rfl:     lasmiditan (REYVOW) 100 MG tablet, Take 1 tablet (100 mg) by mouth as needed for Migraine., Disp: 8 tablet, Rfl: 5    Magnesium Oxide 500 MG CAPS, TAKE 1 CAPSULE BY MOUTH EVERY DAY, Disp: 30 capsule, Rfl: 11    montelukast (SINGULAIR) 10 MG tablet, Take 1 tablet (10 mg) by mouth every evening., Disp: , Rfl:     MOTEGRITY 2 MG TABS, TAKE 1 TABLET (2 MG) BY MOUTH DAILY, Disp: 30 each, Rfl: 2    Multiple Vitamin (ONE-A-DAY ESSENTIAL) TABS, Take 1 tablet by mouth daily., Disp: , Rfl:     nadolol (CORGARD) 40 MG tablet, Take 1 tablet (40 mg) by mouth daily., Disp: 30 tablet, Rfl: 0    norethindrone (ORTHO MICRONOR) 0.35 MG tablet, Take 1 tablet by mouth daily., Disp: 364 tablet, Rfl: 0    omeprazole (PRILOSEC) 20 MG capsule, , Disp: , Rfl: 0    rimegepant (NURTEC) tablet, Take 1 tablet as needed for migraine. No more than 1 tablet per 24 hrs, Disp: 8 tablet, Rfl: 5     Semaglutide-Weight Management 0.25 MG/0.5ML SOAJ, Inject 0.25 mg under the skin every 7 days for 28 days., Disp: 2 mL, Rfl: 0    venlafaxine XR (EFFEXOR XR) 150 MG capsule, Take 1 capsule (150 mg) by mouth daily., Disp: 90 capsule, Rfl: 0    venlafaxine XR (EFFEXOR XR) 75 MG capsule,  Take 1 capsule (75 mg) by mouth daily., Disp: 90 capsule, Rfl: 0    verapamil (CALAN SR) 180 MG SR tablet, Take 1 tablet (180 mg) by mouth daily., Disp: 30 tablet, Rfl: 0     ALLERGIES:   Gluten meal     FAMILY HISTORY:   family history includes Cancer in her m grandmother; Cholesterol/Lipid Disorder in her mother; Heart Disease in her mother.     SOCIAL HISTORY:    reports that she has never smoked. She has never used smokeless tobacco. She reports that she does not currently use alcohol. She reports that she does not use drugs.    ROS:  Pertinent positives and negatives noted in HPI, otherwise reviewed and negative.    VITALS:  [Today's height/weight self reported per patient]  Vitals:    12/20/22 0815   Weight: 99.8 kg (220 lb)   Height: 5\' 1"  (1.549 m)     Blood Pressure   07/23/22 (!) 116/53   10/10/21 108/71   06/09/21 90/62     @LASTHR (3)@  The ASCVD Risk score (Arnett DK, et al., 2019) failed to calculate for the following reasons:    The 2019 ASCVD risk score is only valid for ages 58 to 39    PERTINENT LABS:  A1C:   Lab Results   Component Value Date    A1C 5.6 07/21/2022     LDL:No results found for: "LDLCHOLCALNI", "LDLCALC"    TSH: No results found for: "TSH"     PHYSICAL EXAMINATION [exam limited due to virtual video assessment]:  Physical Exam  General: Respiratory rate appears normal, no acute distress, sitting comfortably  EENT: Mucous membranes moist, no visible lip cracking, no active nasal drainage  Neck: Supple, no visible goiter  Respiratory: No retractions, no nasal flaring, normal overall work of breathing  Cardio: No cyanosis  Skin: No visible rash or lesions, hair normal appearance   Psych: Normal affect, mood,  and speech  Neuro: CN II-XII grossly intact    ASSESSMENT AND PLAN:  Kristen Olson is a 27 year old female with a history of obesity and several weight-related co-morbidities who qualifies for pharmacologic intervention [BMI >30 or >27 with comorbidities], with a goal of achieving a normal BMI to improve quality of life and decrease obesity-related complications.     - all questions were answered and we created a follow-up plan for the future  - ER and return precautions were reviewed         12/20/2022     8:15 AM 07/22/2022     7:40 AM 07/21/2022    10:38 PM 10/10/2021     6:51 AM 09/16/2021    10:18 AM 06/09/2021     3:23 PM   Date Weight Recorded   Metric 99.791 kg 95.1 kg 93.4 kg 92.987 kg 92.08 kg 90.81 kg   Pounds/Ounces 220 lb 209 lb 10.5 oz 205 lb 14.6 oz 205 lb 203 lb 200 lb 3.2 oz          12/20/2022     8:15 AM 07/22/2022     7:40 AM 10/10/2021     6:51 AM 09/16/2021    10:18 AM 06/09/2021     3:23 PM   Date BMI Recorded   BMI 41.57 kg/m2 39.61 kg/m2 38.73 kg/m2 38.36 kg/m2 37.83 kg/m2      Current BMI: Body mass index is 41.57 kg/m.  Starting weight/BMI in 2024: 220lbs         ICD-10-CM ICD-9-CM  1. Morbid obesity (CMS-HCC)   -She has not met her weight loss goal of losing at least 5% of her body weight though lifestyle interventions. She is high risk for weight related complications. I have prescribed Wegovy to help with weight management and reduce her risk of weight related complications. E66.01 278.01 Semaglutide-Weight Management 0.25 MG/0.5ML SOAJ      Consult/Referral to Other Clinic      2. Hx of cardiac arrest   -Begin Wegovy to reduce risk of major cardiovascular event  Z86.74 V12.53 Consult/Referral to Other Clinic      3. Prediabetes   -begin VFIEPP.  R73.03 790.29 Consult/Referral to Other Clinic          Medications: Options, side effects, and contraindications discussed in detail with patient.  - For women of child-bearing age, we discussed the importance of preventing pregnancy  and using a secondary method if using OCPs as they may be less effective. We also discussed that weight loss itself can increase fertility and increased risk of unintentional pregnancy. If desiring pregnancy or changes in contraception occur, the patient should promptly let her care team know.     Priority: Encouraged patient to feel confident in who they are and to prioritize goals of health in order to improve quality of life and decrease obesity-related disease, rather than achieving a scale number or appearance. Consider mental and emotional health throughout this process and always report any concerns.      Goals:   Discussed current BMI and other metabolic metrics of success.   Consider looking into a scale that has body fat % to track lean body mass.   If baseline labs show evidence of metabolic dysfunction, I recommend checking labs every 3 months to monitor progress.   Examples of success may include, but may not be limited to:   - obtaining a BMI between 20 and 24.9  - gaining a healthy and sustainable relationship with food, exercise, and self-image  - achieving 5-10% loss of current body weight  - increase in lean body mass and decrease in visceral body fat (measured through DEXA)  - improvement of metabolic laboratory metrics (lipid levels, A1c, VO2 max, etc)  - reversal of obesity-related comorbid conditions  - general improvement in quality of life, pain, and function    Food intake:   Reviewed the concept of daily output exceeding daily input but the importance of meeting basic nutritional needs.   We discussed that a healthy relationship with food is essential and working with a counselor and dietician can help as disordered eating can be common in patients who struggle with weight.   Healthy food intake discussed in detail, including caloric intake goals, but that I do not recommend <800 kcal/day.    Nutrient and macro tracking may be helpful for many patients to help build awareness and  mindfulness around nutrient and macronutrient intake but a hyper-focus on weight or calories is not always a best long-term strategy.        Exercise: Encouraged regular exercise, starting slow and increasing over time with a focus on a movement practice that is both sustainable enjoyable.   1) At least 150 minutes a week (for example, 30 minutes a day, 5 days a week) of moderate-intensity activity such as brisk walking. Or they need 75 minutes a week of vigorous-intensity activity such as hiking, jogging, or running.  2) At least 2 days a week of activities that strengthen muscles.  3) Plus activities to improve balance,  such as standing on one foot.    Behavior Modification focused on the 6 pillars of lifestyle medicine.   Encouraged long-term modification of food intake, physical activity, and behavior. We reviewed strategies to improve diet/eating patterns, exercise/physical activity, sleep, stress management, and the importance of avoiding high risk substances.   We also discussed the importance of healthy relationships/social connections both at home, in work, and in other social environments.  Counseling, books, and/or group coaching programs have proven to be helpful in this process.      Follow-up recommended:   For weight management:   - Every other week for 1st months when adjusting medications or working on an intervention  - monthly thereafter until weight goals are achieved  - every 3-6 months for maintenance and accountability    For primary care, chronic disease care, and health maintenance:   - I highly recommend ongoing primary care f/u as well for other health care needs and maintenance. I recommend at least an annual exam yearly and basic labs yearly unless medications or conditions require closer monitoring or follow-up.     Resources: [facility] App has tools including weight, blood pressure, food, fluids logs as well as appointment availability with nutritionists, personal trainers, and  counselors that is highly recommended during this process as an adjunct to the plan of care determined with your clinician.      FOLLOW UP  Return in about 4 weeks (around 01/17/2023).

## 2022-12-20 NOTE — Patient Instructions (Addendum)
WUXLKG  - I have prescribed Wegovy to the pharmacy. Get clearance from cardiologist before starting this medication.   - first we will be working on English as a second language teacher and this can take [place] 1-2 weeks.   - please go to [email or web address] to review cost savings for you along with a free weight management coaching program called "WeGoTogether" that may count as medically supervised weight loss to ensure ongoing insurance coverage for you.     Semaglutide (Wegovy, Ozempic, Rybelsus) is a medicine used for weight loss in specific patients, and to lower blood sugar levels and reduce the risk of major cardiovascular events such as heart attack or stroke in type two diabetes patients. Semaglutide is a GLP-1 agonist and works by increasing insulin release, lowering the amount of glucagon released, delaying gastric emptying and reducing appetite. The most commonly reported adverse reactions have included nausea, vomiting, diarrhea, abdominal pain, and constipation   Brand of semaglutide  Wegovy   Form  subcutaneous injection   Strength  0.25 mg dose pen, 0.5 mg dose pen, 1 mg dose pen, 1.7 mg dose pen, 2.4 mg dose pen   Dose  weekly   Uses  Used for weight loss for patients who have an initial BMI (body mass index) of 30kg/m2 or greater or patients who have a BMI of 27kg/m2 or greater and also have least one weight-related condition, such as high blood pressure, type 2 diabetes and high cholesterol. It is use together with diet and exercise.

## 2022-12-20 NOTE — Telephone Encounter (Signed)
[facility] Medication Prior Authorization     Previous encounter created for same issue?  No    Who is calling?: Pharmacy     What medication needs Prior Auth: Semaglutide-Weight Management 0.25 MG/0.5ML SOAJ     Is this a new medication? No    Has patient tried and failed any alternative mediations? No     If yes, please state which medications and patients reaction.     (Information needed below is found on patients insurance card or by calling pharmacy)  Member Insurance Name: [employer]ody  Member Medication ID #: [id number]  BIN #: [id number]  PCN #: [id number]  Rx Group #: [id number]    Is patient requesting an urgent request?: No (Patient has enough medication to last the 72 business hours turnaround time)    PLEASE ROUTE MESSAGE TO [facility] RX PRIOR AUTH POOL    [name]

## 2022-12-21 ENCOUNTER — Other Ambulatory Visit (INDEPENDENT_AMBULATORY_CARE_PROVIDER_SITE_OTHER): Payer: Self-pay | Admitting: Physician Assistant

## 2022-12-21 DIAGNOSIS — I499 Cardiac arrhythmia, unspecified: Secondary | ICD-10-CM

## 2022-12-21 DIAGNOSIS — Z950 Presence of cardiac pacemaker: Secondary | ICD-10-CM

## 2022-12-21 NOTE — Telephone Encounter (Signed)
Hello,     As this request is for a self-administered injectable, and the pt has [employer] insurance, this request will need to be placed as a referral. Our referral department handles these requests via a specific [employer] portal.     Please reach out to our referral department with any questions or concerns.    Thank you.

## 2022-12-21 NOTE — Telephone Encounter (Signed)
Referral has been placed. 

## 2022-12-22 MED ORDER — NADOLOL 40 MG OR TABS
40.00 mg | ORAL_TABLET | Freq: Every day | ORAL | 0 refills | Status: AC
Start: 2022-12-22 — End: ?

## 2022-12-22 MED ORDER — VERAPAMIL HCL SR (BID) 180 MG OR TBCR
180.00 mg | EXTENDED_RELEASE_TABLET | Freq: Every day | ORAL | 0 refills | Status: AC
Start: 2022-12-22 — End: ?

## 2022-12-22 NOTE — Telephone Encounter (Signed)
Refill sent to pharmacy, patient hopefully to follow up with cardiology soon

## 2023-01-04 ENCOUNTER — Encounter (INDEPENDENT_AMBULATORY_CARE_PROVIDER_SITE_OTHER): Payer: Self-pay | Admitting: Physician Assistant

## 2023-01-04 DIAGNOSIS — K219 Gastro-esophageal reflux disease without esophagitis: Secondary | ICD-10-CM

## 2023-01-04 DIAGNOSIS — J45909 Unspecified asthma, uncomplicated: Secondary | ICD-10-CM

## 2023-01-05 MED ORDER — MONTELUKAST SODIUM 10 MG OR TABS
10.00 mg | ORAL_TABLET | Freq: Every evening | ORAL | 0 refills | Status: AC
Start: 2023-01-05 — End: 2023-04-05

## 2023-01-05 MED ORDER — OMEPRAZOLE 20 MG OR CPDR
20.0000 mg | DELAYED_RELEASE_CAPSULE | Freq: Every day | ORAL | 0 refills | Status: AC
Start: 2023-01-05 — End: ?

## 2023-01-10 ENCOUNTER — Encounter (INDEPENDENT_AMBULATORY_CARE_PROVIDER_SITE_OTHER): Payer: Self-pay | Admitting: Physician Assistant

## 2023-01-10 ENCOUNTER — Ambulatory Visit (INDEPENDENT_AMBULATORY_CARE_PROVIDER_SITE_OTHER): Admitting: Physician Assistant

## 2023-01-11 ENCOUNTER — Encounter (INDEPENDENT_AMBULATORY_CARE_PROVIDER_SITE_OTHER): Payer: Self-pay | Admitting: Physician Assistant

## 2023-01-12 ENCOUNTER — Telehealth (INDEPENDENT_AMBULATORY_CARE_PROVIDER_SITE_OTHER): Admitting: Physician Assistant

## 2023-01-12 ENCOUNTER — Encounter (INDEPENDENT_AMBULATORY_CARE_PROVIDER_SITE_OTHER): Payer: Self-pay | Admitting: Physician Assistant

## 2023-01-16 ENCOUNTER — Telehealth: Admitting: Physician Assistant

## 2023-01-16 ENCOUNTER — Encounter (INDEPENDENT_AMBULATORY_CARE_PROVIDER_SITE_OTHER): Payer: Self-pay | Admitting: Physician Assistant

## 2023-01-16 MED ORDER — LITHIUM CARBONATE 150 MG OR CAPS
ORAL_CAPSULE | ORAL | Status: AC
Start: 2023-01-09 — End: ?

## 2023-01-16 NOTE — Progress Notes (Signed)
[facility] Video Visit    Interactive real time audio and visual communication used for the telehealth visit.   HPI   Kristen Olson is a 27 year old female with history of  anxiety, depression, SI attempt, chronic migraine, arrythmia, pacemaker who presents to clinic for disability.     The patient has an ongoing history of depression, anxiety, previous SI attempt in February 2025 who presents for follow up and request for disability. She is currently in IOP program at [facility] although reports that their Intensive Outpatient Program (IOP) is ending this week and they are seeking an extension of their disability. They state that their outside psychiatrist believes they need more time off due to ongoing medical issues. The patient mentions that they have been in the IOP for about five months and have not experienced significant improvement in their mental health. They have recently stopped taking lamotrigine due to side effects and started lithium a few days ago, but have not noticed any difference yet. The patient denies any recent thoughts of self-harm. Lives with husband and home is safe.     The patient also reports having seen a cardiologist for chest pain. They underwent an angiogram, which revealed a small amount of plaque but not enough to warrant a stent. They are scheduled for a follow-up appointment on the 30th and are awaiting further testing to determine the cause of their chest pain. Additionally, they mention a history of chronic headaches and the need to see a neurologist for further evaluation.  Cardiology also recommended patient obtain imaging of gallbladder for possible referred pain to chest.     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 They express interest in trying Transcranial Magnetic Stimulation (TMS) therapy for their treatment-resistant depression but state that their  psychiatrist wants to try a few more medications first. The patient is also attempting to obtain their psych testing results from the IOP, which diagnosed them with four different disorders, to share with their psychiatrist.    Patient is following with outside psychiatry at [facility], although psychiatry is recommending patient have PCP fill out disability forms as she has other health conditions that are currently serious and being worked up.   REVIEW OF SYSTEMS    Negative except for those in HPI      PAST MEDICAL AND SURGICAL HISTORY:    has a past surgical history that includes Cardiac defibrillator placement (09/2017).  Past Medical History:   Diagnosis Date    Depression     Migraine     Spinal lordosis     Tachycardia     Hx Tachycardia, coronary artery spasms - sees cardiologist, on meds. Defibrillator placed 09/2017.        CURRENT MEDICATIONS:    Current Outpatient Medications:     albuterol 108 (90 Base) MCG/ACT inhaler, Inhale 1 puff by mouth every 6 hours as needed for Wheezing., Disp: 6.7 g, Rfl: 0    buPROPion (WELLBUTRIN XL) 300 MG XL tablet, Take 1 tablet (300 mg) by mouth every morning., Disp: 90 tablet, Rfl: 0    docusate sodium (COLACE) 250 MG capsule, TAKE 1 CAPSULE (250 MG) BY MOUTH 2 TIMES DAILY, Disp: 60 capsule, Rfl: 2    hydrOXYzine HCL (ATARAX) 10 MG tablet, Take 1 tablet (10 mg) by mouth every 8 hours as needed., Disp: , Rfl:     lamoTRIgine (LAMICTAL) 25 MG tablet, TAKE 1 TABLET  BY MOUTH IN THE MORNING FOR 2 WEEKS. IF TOLERATED INCREASE TO 2 TABLETS IN THE MORNING, Disp: , Rfl:     lasmiditan (REYVOW) 100 MG tablet, Take 1 tablet (100 mg) by mouth as needed for Migraine., Disp: 8 tablet, Rfl: 5    lithium (ESKALITH) 150 MG capsule, , Disp: , Rfl:     Magnesium Oxide 500 MG CAPS, TAKE 1 CAPSULE BY MOUTH EVERY DAY, Disp: 30 capsule, Rfl: 11    montelukast (SINGULAIR) 10 MG tablet, Take 1 tablet (10 mg) by mouth every evening., Disp: 90 tablet, Rfl: 0    MOTEGRITY 2 MG TABS, TAKE 1 TABLET (2 MG)  BY MOUTH DAILY, Disp: 30 each, Rfl: 2    Multiple Vitamin (ONE-A-DAY ESSENTIAL) TABS, Take 1 tablet by mouth daily., Disp: , Rfl:     nadolol (CORGARD) 40 MG tablet, TAKE 1 TABLET BY MOUTH EVERY DAY, Disp: 30 tablet, Rfl: 0    norethindrone (ORTHO MICRONOR) 0.35 MG tablet, Take 1 tablet by mouth daily., Disp: 364 tablet, Rfl: 0    omeprazole (PRILOSEC) 20 MG capsule, Take 1 capsule (20 mg) by mouth every morning (before breakfast)., Disp: 90 capsule, Rfl: 0    rimegepant (NURTEC) tablet, Take 1 tablet as needed for migraine. No more than 1 tablet per 24 hrs, Disp: 8 tablet, Rfl: 5    Semaglutide-Weight Management 0.25 MG/0.5ML SOAJ, Inject 0.25 mg under the skin every 7 days for 28 days., Disp: 2 mL, Rfl: 0    venlafaxine XR (EFFEXOR XR) 150 MG capsule, Take 1 capsule (150 mg) by mouth daily., Disp: 90 capsule, Rfl: 0    venlafaxine XR (EFFEXOR XR) 75 MG capsule, Take 1 capsule (75 mg) by mouth daily., Disp: 90 capsule, Rfl: 0    verapamil (CALAN SR) 180 MG SR tablet, TAKE 1 TABLET BY MOUTH EVERY DAY, Disp: 30 tablet, Rfl: 0     ALLERGIES:  Gluten meal    FAMILY HISTORY:  family history includes Cancer in her m grandmother; Cholesterol/Lipid Disorder in her mother; Heart Disease in her mother.    SOCIAL HISTORY:    reports that she has never smoked. She has never used smokeless tobacco. She reports that she does not currently use alcohol. She reports that she does not use drugs.     PHYSICAL EXAMINATION    There were no vitals filed for this visit.  There is no height or weight on file to calculate BMI.  Wt Readings from Last 5 Encounters:   12/20/22 99.8 kg (220 lb)   07/22/22 95.1 kg (209 lb 10.5 oz)   10/10/21 93 kg (205 lb)   09/16/21 92.1 kg (203 lb)   06/09/21 90.8 kg (200 lb 3.2 oz)     Blood Pressure   07/23/22 (!) 116/53   10/10/21 108/71   06/09/21 90/62   03/25/18 100/68   05/08/14 121/67       GEN: in NAD, A&O, pleasant, non-toxic appearing  EENT: No injection or icterus.   Psych: Alert and Oriented.  Affect appropriate.       ASSESSMENT AND PLAN    1. Encounter for disability determination     - Patient's current disability expires on the 30th.     - Plan: Extend disability for six weeks. Patient to send disability paperwork through a message for completion.    2. Depression, unspecified depression type     - Patient has completed IOP at [facility] without significant improvement in symptoms.     -  Patient has recently started lithium and discontinued lamotrigine due to side effects.     - Plan: Extend disability for six weeks. Encourage patient to explore [employer] and support groups online. Increase frequency of therapy visits if needed. Follow up with the outside psychiatrist for medication adjustments and consider TMS therapy in the future.    3. Anxiety     - Patient reports no significant improvement in symptoms despite current medication.     - Plan: Continue current anxiety medication and follow up with the outside psychiatrist for possible adjustments.  Discussed     4. Other chest pain     - Patient suggests the possibility of referred pain from the gallbladder as a cause for chest pain.     - Plan: Order right upper quadrant ultrasound to assess gallbladder status.  Follow up with cardiology on 01/26/23.  - Ultrasound Abdomen Right Upper Quadrant    Patient understands and is agreeable to the treatment plan.  All  questions have been answered thoroughly.    There are no Patient Instructions on file for this visit.    FOLLOW UP   Return in about 6 weeks (around 02/27/2023) for Follow Up.    Nathanial Millman, PA-C

## 2023-01-17 ENCOUNTER — Encounter (INDEPENDENT_AMBULATORY_CARE_PROVIDER_SITE_OTHER): Payer: Self-pay | Admitting: Physician Assistant

## 2023-01-19 ENCOUNTER — Encounter (INDEPENDENT_AMBULATORY_CARE_PROVIDER_SITE_OTHER): Payer: Self-pay | Admitting: Physician Assistant

## 2023-01-21 ENCOUNTER — Other Ambulatory Visit (INDEPENDENT_AMBULATORY_CARE_PROVIDER_SITE_OTHER): Payer: Self-pay | Admitting: Physician Assistant

## 2023-01-21 DIAGNOSIS — Z950 Presence of cardiac pacemaker: Secondary | ICD-10-CM

## 2023-01-21 DIAGNOSIS — I499 Cardiac arrhythmia, unspecified: Secondary | ICD-10-CM

## 2023-01-22 ENCOUNTER — Encounter (INDEPENDENT_AMBULATORY_CARE_PROVIDER_SITE_OTHER): Payer: Self-pay | Admitting: Physician Assistant

## 2023-01-22 NOTE — Telephone Encounter (Signed)
Refills should be continued by cardiology

## 2023-01-24 ENCOUNTER — Encounter (INDEPENDENT_AMBULATORY_CARE_PROVIDER_SITE_OTHER): Payer: Self-pay | Admitting: Physician Assistant

## 2023-01-24 NOTE — Progress Notes (Signed)
Work leave of absence letter requested by patient

## 2023-01-31 ENCOUNTER — Encounter (INDEPENDENT_AMBULATORY_CARE_PROVIDER_SITE_OTHER): Payer: Self-pay | Admitting: Physician Assistant

## 2023-01-31 DIAGNOSIS — Z309 Encounter for contraceptive management, unspecified: Secondary | ICD-10-CM

## 2023-01-31 NOTE — Telephone Encounter (Signed)
Called pt and left VM. If pt calls back please confirm that she is getting these refills through her cardiologist, and please send message back.     Thank you

## 2023-02-02 ENCOUNTER — Encounter (INDEPENDENT_AMBULATORY_CARE_PROVIDER_SITE_OTHER): Payer: Self-pay | Admitting: Physician Assistant

## 2023-02-02 MED ORDER — NORETHINDRONE (CONTRACEPTIVE) 0.35 MG OR TABS
1.0000 | ORAL_TABLET | Freq: Every day | ORAL | 0 refills | Status: AC
Start: 2023-02-02 — End: ?

## 2023-02-05 ENCOUNTER — Other Ambulatory Visit (INDEPENDENT_AMBULATORY_CARE_PROVIDER_SITE_OTHER): Payer: Self-pay | Admitting: Physician Assistant

## 2023-02-05 DIAGNOSIS — Z8674 Personal history of sudden cardiac arrest: Secondary | ICD-10-CM

## 2023-02-05 DIAGNOSIS — I499 Cardiac arrhythmia, unspecified: Secondary | ICD-10-CM

## 2023-02-05 DIAGNOSIS — R7303 Prediabetes: Secondary | ICD-10-CM

## 2023-02-05 MED ORDER — SEMAGLUTIDE-WEIGHT MANAGEMENT 0.25 MG/0.5ML SC SOAJ
SUBCUTANEOUS | Status: DC
Start: ? — End: 2023-02-05

## 2023-02-05 MED ORDER — SEMAGLUTIDE-WEIGHT MANAGEMENT 0.25 MG/0.5ML SC SOAJ: 0.2500 mg | SUBCUTANEOUS | 0 refills | Status: DC

## 2023-02-05 NOTE — Telephone Encounter (Signed)
[facility] Refill Request     !!! PA Leotis Shames will be out of office until 9/16. Patient needs wegovy prescription sent to [facility] pharmacy!!!    Has there been a previous Telephone/MyChart encounter in the last month for this prescription?: No    Has patient attempted to request refill from pharmacy?: Yes (No refills left)    Medication and pharmacy pended: Yes     Who last prescribed this medication?:   PA Lauren Campagna.   Is this provider in-office today and still seeing patients?: No - caller informed that provider will next be in-office on 9/16 (if relevant, inform caller that all refill requests are reviewed by covering providers)    Does the patient have enough medication to last 72 business hours?: Yes (Caller aware that turnaround time is typically 72 business hours)    Does the patient's pharmacy usually notify them when prescriptions are ready?: Yes, and patient recommended to wait for pharmacy notification.    No future appointments.    [name]

## 2023-02-05 NOTE — Telephone Encounter (Signed)
Can you please confirm with patient that cardiology took over prescription? They should be

## 2023-02-05 NOTE — Telephone Encounter (Signed)
Cardiology following and took over prescription management

## 2023-02-06 NOTE — Telephone Encounter (Signed)
LM for pt to call back, please ask pt if she getting her medications from her cardiologist? Please send me and lacey a TE back.

## 2023-02-12 ENCOUNTER — Encounter (INDEPENDENT_AMBULATORY_CARE_PROVIDER_SITE_OTHER): Payer: Self-pay | Admitting: Physician Assistant

## 2023-02-12 ENCOUNTER — Encounter (INDEPENDENT_AMBULATORY_CARE_PROVIDER_SITE_OTHER): Payer: Self-pay

## 2023-02-12 ENCOUNTER — Telehealth (INDEPENDENT_AMBULATORY_CARE_PROVIDER_SITE_OTHER): Payer: Self-pay

## 2023-02-12 NOTE — Telephone Encounter (Signed)
F[facility]ed [facility]

## 2023-02-12 NOTE — Telephone Encounter (Signed)
[facility] Encounter Note    Previous Telephone/MyChart encounter created for same issue?  No    Please describe the action taken by yourself in detail:  pt pharmacy called and asked for a PA I try to look for the insurance but I didn't see any on the chart     This is just documentation in the patient chart, no further action is required.    My phone extension, if further information is needed: [phone]    Kristen Olson

## 2023-02-28 ENCOUNTER — Telehealth (INDEPENDENT_AMBULATORY_CARE_PROVIDER_SITE_OTHER): Payer: Self-pay | Admitting: Internal Medicine

## 2023-02-28 ENCOUNTER — Telehealth (INDEPENDENT_AMBULATORY_CARE_PROVIDER_SITE_OTHER): Payer: Self-pay

## 2023-02-28 NOTE — Telephone Encounter (Signed)
[facility] Provider Message    Has there been a previous Telephone/MyChart encounter in the last month for same issue?: No    Caller informed that with few exceptions a visit is preferred and encouraged to schedule a visit: N/A    Who's calling?: Delice Bison from [employer] and [facility]    The call is regarding:     Stated she wanted to inform provider that the patient is enrolled in their virtual program and wanted to know if she can help coordinate care. Stated she will fax over a release of information, gave stat fax number. Requesting call back at [phone]    Which provider is this relevant to?: Post, Wylene Men.  Is this provider in-office today and still seeing patients?: Yes    Ok with a response from a Engineer, site?: Yes    Caller informed that message should be addressed by a provider within 72 business hours?: Yes    If caller is requesting a more urgent response, ROUTE HIGH PRIORITY and inform patient that [facility] will make every effort to address within 24 business hours.    Please ask the caller how they would like to be contacted?: Phone Call. Best call back number: [phone]    No future appointments.    Lizbeth Bark

## 2023-02-28 NOTE — Telephone Encounter (Signed)
Spoke with Delice Bison, patient is still in IOP virtually, she may step up to inpatient facility. I agreed with this. She does follow with psychiatry too. She is on disability until 03/12/23 but will most likely be extending that. Will coordinate with patient.

## 2023-02-28 NOTE — Telephone Encounter (Signed)
MR faxed to [facility]

## 2023-03-04 ENCOUNTER — Other Ambulatory Visit (INDEPENDENT_AMBULATORY_CARE_PROVIDER_SITE_OTHER): Payer: Self-pay | Admitting: Physician Assistant

## 2023-03-04 DIAGNOSIS — Z950 Presence of cardiac pacemaker: Secondary | ICD-10-CM

## 2023-03-04 DIAGNOSIS — I499 Cardiac arrhythmia, unspecified: Secondary | ICD-10-CM

## 2023-03-05 NOTE — Telephone Encounter (Signed)
Can you ask her if she is getting this through cardiologist? She should be

## 2023-03-05 NOTE — Telephone Encounter (Signed)
Can you actually call her and have her schedule follow up? I believe she is going into inpatient care for mental health and needs extension of disability but will need to discuss.

## 2023-03-06 ENCOUNTER — Encounter (INDEPENDENT_AMBULATORY_CARE_PROVIDER_SITE_OTHER): Payer: Self-pay | Admitting: Physician Assistant

## 2023-03-06 NOTE — Telephone Encounter (Signed)
Called pt, no answer. Will send mychart message.     IF pt calls back please schedule pt a follow up.

## 2023-03-07 ENCOUNTER — Encounter (INDEPENDENT_AMBULATORY_CARE_PROVIDER_SITE_OTHER): Payer: Self-pay | Admitting: Hospital

## 2023-03-08 ENCOUNTER — Telehealth (INDEPENDENT_AMBULATORY_CARE_PROVIDER_SITE_OTHER): Payer: Self-pay | Admitting: Physician Assistant

## 2023-03-08 ENCOUNTER — Encounter (INDEPENDENT_AMBULATORY_CARE_PROVIDER_SITE_OTHER): Payer: Self-pay

## 2023-03-08 NOTE — Telephone Encounter (Signed)
From: Gloris Manchester  To: Cecil Cranker, PA  Sent: 03/06/2023 4:02 PM PDT  Subject: Hi    [facility] they said they still have not received any prior authorization and keep calling the office but can't reach anyone .

## 2023-03-08 NOTE — Telephone Encounter (Signed)
F[facility]ed [facility] @ [phone]

## 2023-03-09 ENCOUNTER — Encounter (INDEPENDENT_AMBULATORY_CARE_PROVIDER_SITE_OTHER): Payer: Self-pay | Admitting: Physician Assistant

## 2023-03-09 ENCOUNTER — Telehealth: Payer: 59 | Admitting: Physician Assistant

## 2023-03-09 DIAGNOSIS — Z0271 Encounter for disability determination: Secondary | ICD-10-CM

## 2023-03-09 DIAGNOSIS — F32A Depression, unspecified: Secondary | ICD-10-CM

## 2023-03-09 DIAGNOSIS — F419 Anxiety disorder, unspecified: Secondary | ICD-10-CM

## 2023-03-09 NOTE — Telephone Encounter (Addendum)
Patient is unable to get wegovy medication due to pharmacy not having authorization on file. Referral was authorized on 7/24.    [facility]oke [facility] who told me that they are waiting on authorization from patients medical group. I asked if she could provide me with their phone number and they told me they are only able to give that number to a provider.      I spoke with patients insurance pharmacy services who informed me I needed to speak with patient medical group as well. They didn't have the number for [facility] medical group either but provided me with fax number. I faxed authorized referral that we have on file to patients medical group.      If referrals could please reach out to patients pharamcy or if you could provide me with patients insurance medical group number so I can reach out to them Directly. Thank you.

## 2023-03-09 NOTE — Telephone Encounter (Signed)
Hi hunter,   I see the referral here -[id number]    But when I try to look for it on the [facility] it is not there,   I am unsure what happen to the referral- but it is not on [facility]      I will resubmit it at this time as stat to the medical group to follow up on approval

## 2023-03-09 NOTE — Progress Notes (Signed)
[facility] Video Visit    Interactive real time audio and visual communication used for the telehealth visit.   HPI   Kristen Olson is a 27 year old female with history of depression, anxiety,  who presents to clinic for follow up.     Patient presents for follow up.     Patient was put on disability for multiple medical conditions - she had cholecystectomy 02/23/23, underwent cardiac testing and angio - has pacemaker. Mainly, she is on disability for mental health.     Patient is under care of psychiatry for depression, anxiety, she is also in IOP program at [facility], she had full evaluation, previous medications have either not worked or patient cannot tolerate due to side effects. Higher level of care was recommended by facility. Patient is on wait list. She denies SI/HI.       REVIEW OF SYSTEMS    Negative except for those in HPI      PAST MEDICAL AND SURGICAL HISTORY:    has a past surgical history that includes Cardiac defibrillator placement (09/2017).  Past Medical History:   Diagnosis Date    Depression     Migraine     Spinal lordosis     Tachycardia     Hx Tachycardia, coronary artery spasms - sees cardiologist, on meds. Defibrillator placed 09/2017.        CURRENT MEDICATIONS:    Current Outpatient Medications:     albuterol 108 (90 Base) MCG/ACT inhaler, Inhale 1 puff by mouth every 6 hours as needed for Wheezing., Disp: 6.7 g, Rfl: 0    buPROPion (WELLBUTRIN XL) 300 MG XL tablet, Take 1 tablet (300 mg) by mouth every morning., Disp: 90 tablet, Rfl: 0    docusate sodium (COLACE) 250 MG capsule, TAKE 1 CAPSULE (250 MG) BY MOUTH 2 TIMES DAILY, Disp: 60 capsule, Rfl: 2    hydrOXYzine HCL (ATARAX) 10 MG tablet, Take 1 tablet (10 mg) by mouth every 8 hours as needed., Disp: , Rfl:     lamoTRIgine (LAMICTAL) 25 MG tablet, TAKE 1 TABLET BY MOUTH IN THE MORNING FOR 2 WEEKS. IF TOLERATED INCREASE TO 2 TABLETS IN THE MORNING, Disp: , Rfl:     lasmiditan (REYVOW) 100 MG tablet, Take 1  tablet (100 mg) by mouth as needed for Migraine., Disp: 8 tablet, Rfl: 5    lithium (ESKALITH) 150 MG capsule, , Disp: , Rfl:     Magnesium Oxide 500 MG CAPS, TAKE 1 CAPSULE BY MOUTH EVERY DAY, Disp: 30 capsule, Rfl: 11    montelukast (SINGULAIR) 10 MG tablet, Take 1 tablet (10 mg) by mouth every evening., Disp: 90 tablet, Rfl: 0    MOTEGRITY 2 MG TABS, TAKE 1 TABLET (2 MG) BY MOUTH DAILY, Disp: 30 each, Rfl: 2    Multiple Vitamin (ONE-A-DAY ESSENTIAL) TABS, Take 1 tablet by mouth daily., Disp: , Rfl:     nadolol (CORGARD) 40 MG tablet, TAKE 1 TABLET BY MOUTH EVERY DAY, Disp: 30 tablet, Rfl: 0    norethindrone (ORTHO MICRONOR) 0.35 MG tablet, Take 1 tablet by mouth daily., Disp: 364 tablet, Rfl: 0    omeprazole (PRILOSEC) 20 MG capsule, Take 1 capsule (20 mg) by mouth every morning (before breakfast)., Disp: 90 capsule, Rfl: 0    rimegepant (NURTEC) tablet, Take 1 tablet as needed for migraine. No more than 1 tablet per 24 hrs, Disp: 8 tablet, Rfl: 5    Semaglutide-Weight Management 0.25 MG/0.5ML SOAJ, Inject 0.25 mg under  the skin every 7 days., Disp: 2 mL, Rfl: 0    venlafaxine XR (EFFEXOR XR) 150 MG capsule, Take 1 capsule (150 mg) by mouth daily., Disp: 90 capsule, Rfl: 0    venlafaxine XR (EFFEXOR XR) 75 MG capsule, Take 1 capsule (75 mg) by mouth daily., Disp: 90 capsule, Rfl: 0    verapamil (CALAN SR) 180 MG SR tablet, TAKE 1 TABLET BY MOUTH EVERY DAY, Disp: 30 tablet, Rfl: 0     ALLERGIES:  Gluten meal    FAMILY HISTORY:  family history includes Cancer in her m grandmother; Cholesterol/Lipid Disorder in her mother; Heart Disease in her mother.    SOCIAL HISTORY:    reports that she has never smoked. She has never used smokeless tobacco. She reports that she does not currently use alcohol. She reports that she does not use drugs.     PHYSICAL EXAMINATION    There were no vitals filed for this visit.  There is no height or weight on file to calculate BMI.  Wt Readings from Last 5 Encounters:   12/20/22 99.8 kg  (220 lb)   07/22/22 95.1 kg (209 lb 10.5 oz)   10/10/21 93 kg (205 lb)   09/16/21 92.1 kg (203 lb)   06/09/21 90.8 kg (200 lb 3.2 oz)     Blood Pressure   07/23/22 (!) 116/53   10/10/21 108/71   06/09/21 90/62   03/25/18 100/68   05/08/14 121/67       GEN: in NAD, A&O, pleasant, non-toxic appearing  EENT: No injection or icterus.   Psych: Alert and Oriented. Affect appropriate.       ASSESSMENT AND PLAN    1. Encounter for disability determination  Work note and disability extension provided until 05/31/22.     2. Depression, unspecified depression type  Patient followed by psychiatry, Currently in IOP program at [facility]. Recommended higher level of care. Patient is on wait list for residential which may be in another 2 weeks. Residential will be 30-60 days.   Denies SI/HI.   Medications prescribed by psychiatry.     3. Anxiety  See above.     Patient understands and is agreeable to the treatment plan.  All  questions have been answered thoroughly.      There are no Patient Instructions on file for this visit.    FOLLOW UP   Return in about 8 weeks (around 05/04/2023) for Follow Up.    Nathanial Millman, PA-C

## 2023-03-10 NOTE — Telephone Encounter (Signed)
[facility] Provider Message    Has there been a previous Telephone/MyChart encounter in the last month for same issue?: No    Caller informed that with few exceptions a visit is preferred and encouraged to schedule a visit: N/A    Who's calling?: Scarlette Calico / [place] [facility].    The call is regarding:     P/A was sent to [facility] for wegovy. But notes were not sent. She will submit today, most likely will be denied. Once denied, you may submit again with visit notes stating why medication is needed.     Which provider is this relevant to?: Lauren Campagna.  Is this provider in-office today and still seeing patients?: No - caller informed that provider will next be in-office on 10/14 (if relevant, inform caller that all messages are reviewed by covering providers)    Ok with a response from a Engineer, site?: Yes    Caller informed that message should be addressed by a provider within 72 business hours?: Yes    If caller is requesting a more urgent response, ROUTE HIGH PRIORITY and inform patient that [facility] will make every effort to address within 24 business hours.    Please ask the caller how they would like to be contacted?: Phone Call. Best call back number: [phone]    No future appointments.    Araceli Lily Kocher

## 2023-03-12 NOTE — Telephone Encounter (Signed)
Clinicals have been faxed/and uploaded to the referral on [facility]

## 2023-03-14 ENCOUNTER — Encounter (INDEPENDENT_AMBULATORY_CARE_PROVIDER_SITE_OTHER): Payer: Self-pay | Admitting: Family

## 2023-03-17 ENCOUNTER — Other Ambulatory Visit (INDEPENDENT_AMBULATORY_CARE_PROVIDER_SITE_OTHER): Payer: Self-pay | Admitting: Physician Assistant

## 2023-03-17 DIAGNOSIS — I499 Cardiac arrhythmia, unspecified: Secondary | ICD-10-CM

## 2023-03-17 DIAGNOSIS — F32A Depression, unspecified: Secondary | ICD-10-CM

## 2023-03-20 ENCOUNTER — Encounter (INDEPENDENT_AMBULATORY_CARE_PROVIDER_SITE_OTHER): Payer: Self-pay | Admitting: Physician Assistant

## 2023-03-21 ENCOUNTER — Encounter (INDEPENDENT_AMBULATORY_CARE_PROVIDER_SITE_OTHER): Payer: Self-pay | Admitting: Physician Assistant

## 2023-03-21 DIAGNOSIS — I209 Angina pectoris, unspecified: Secondary | ICD-10-CM

## 2023-03-21 MED ORDER — NITROGLYCERIN 0.4 MG SL SUBL
0.4000 mg | SUBLINGUAL_TABLET | SUBLINGUAL | 0 refills | Status: AC | PRN
Start: 2023-03-21 — End: ?

## 2023-03-21 NOTE — Progress Notes (Signed)
Patient requesting nitroglycerin tablet refill, she cannot reach cardiologist. She takes for anginal spasms, these have been stable and followed by cardiology but helps with symptoms. Patient is currently in an inpatient facility for mental health and not able to schedule appointment. I agreed to provide short course for symptoms will strict ER precautions.

## 2023-04-02 ENCOUNTER — Telehealth (INDEPENDENT_AMBULATORY_CARE_PROVIDER_SITE_OTHER): Payer: Self-pay | Admitting: Physician Assistant

## 2023-04-02 NOTE — Telephone Encounter (Signed)
LVMTCB    If patient calls back please let her know her insurance is requesting she be enrolled in medically supervised program before they will cover medication for her. Please schedule for follow up with PA Cecil Cranker if she would like to continue to pursue coverage for weight loss meds.

## 2023-04-03 ENCOUNTER — Telehealth (INDEPENDENT_AMBULATORY_CARE_PROVIDER_SITE_OTHER): Payer: Self-pay | Admitting: Physician Assistant

## 2023-04-03 NOTE — Telephone Encounter (Signed)
[facility] Encounter Note    Previous Telephone/MyChart encounter created for same issue?  No    Please describe the action taken by yourself in detail:  pt called to schedule a video visit for weight loss medication    This is just documentation in the patient chart, no further action is required.    My phone extension, if further information is needed: [phone]    Kimberli Scoggins

## 2023-04-04 ENCOUNTER — Telehealth (INDEPENDENT_AMBULATORY_CARE_PROVIDER_SITE_OTHER): Admitting: Physician Assistant

## 2023-04-04 MED ORDER — METFORMIN HCL 500 MG OR TABS
500.0000 mg | ORAL_TABLET | Freq: Two times a day (BID) | ORAL | 0 refills | Status: DC
Start: 2023-04-04 — End: 2023-04-11

## 2023-04-04 MED ORDER — SEMAGLUTIDE-WEIGHT MANAGEMENT 0.25 MG/0.5ML SC SOAJ
0.2500 mg | SUBCUTANEOUS | 0 refills | Status: AC
Start: 2023-04-04 — End: 2023-05-02

## 2023-04-04 NOTE — Progress Notes (Signed)
VIDEO VISIT: Patient consented to this service being conducted by interactive realtime audio and video communication.      HPI  Kristen Olson is a 27 year old female being seen virtually for follow-up at the [facility].  Patient's current BMI is Body mass index is 41.57 kg/m. , categorizing them as morbidly obese .  Patient has a goal of achieving a normal BMI [18.5-24.9] to improve quality of life and reduce obesity-related complications.  Patient currently has CAD co-morbidities.  Patient is currently adjusting food intake, exercise, and behavior modification as discussed to achieve goals.       Weight Loss Medication:   None. Medication denied because she has not participated in a medically supervised program for at least 3 months prior or for at least 6 months in the past year.   Phentermine - CI due to cardiac disease. Pt has ICD in place for management ventricular arrhythmia   Qsymia - CI due to cardiac disease   Wellbutrin - already using medication, ineffective for weight loss  Contrave - using separate rx for Wellbutrin, ineffective for weight loss   Topiramate used for migraines in 2014 for 1 year,  caused side effects so it was discontinued    Current Food Intake:    Limiting portion size. Protein with each meal    Current Exercise:   Amount and Type: currently in a mental health facility. Doing 5 days a week of exercise either yoga, walking or circuit training.     Recent weights [Today's weight self reported per patient]:  Wt Readings from Last 3 Encounters:   04/04/23 99.8 kg (220 lb)   12/20/22 99.8 kg (220 lb)   07/22/22 95.1 kg (209 lb 10.5 oz)       Current BMI:  Body mass index is 41.57 kg/m.    Goal:   Normal BMI [18.5-24.9]      PAST MEDICAL HISTORY:  Past Medical History:   Diagnosis Date    Depression     Migraine     Spinal lordosis     Tachycardia     Hx Tachycardia, coronary artery spasms - sees cardiologist, on meds. Defibrillator placed  09/2017.       CURRENT MEDICATIONS:     Current Outpatient Medications:     albuterol 108 (90 Base) MCG/ACT inhaler, Inhale 1 puff by mouth every 6 hours as needed for Wheezing., Disp: 6.7 g, Rfl: 0    buPROPion (WELLBUTRIN XL) 300 MG XL tablet, Take 1 tablet (300 mg) by mouth every morning., Disp: 90 tablet, Rfl: 0    docusate sodium (COLACE) 250 MG capsule, TAKE 1 CAPSULE (250 MG) BY MOUTH 2 TIMES DAILY, Disp: 60 capsule, Rfl: 2    hydrOXYzine HCL (ATARAX) 10 MG tablet, Take 1 tablet (10 mg) by mouth every 8 hours as needed., Disp: , Rfl:     lasmiditan (REYVOW) 100 MG tablet, Take 1 tablet (100 mg) by mouth as needed for Migraine., Disp: 8 tablet, Rfl: 5    lithium (ESKALITH) 150 MG capsule, , Disp: , Rfl:     Magnesium Oxide 500 MG CAPS, TAKE 1 CAPSULE BY MOUTH EVERY DAY, Disp: 30 capsule, Rfl: 11    metFORMIN (GLUCOPHAGE) 500 mg tablet, Take 1 tablet (500 mg) by mouth 2 times daily (with meals) for 30 days., Disp: 60 tablet, Rfl: 0    montelukast (SINGULAIR) 10 MG tablet, Take 1 tablet (10 mg) by mouth every evening., Disp: 90 tablet, Rfl: 0  MOTEGRITY 2 MG TABS, TAKE 1 TABLET (2 MG) BY MOUTH DAILY, Disp: 30 each, Rfl: 2    Multiple Vitamin (ONE-A-DAY ESSENTIAL) TABS, Take 1 tablet by mouth daily., Disp: , Rfl:     nadolol (CORGARD) 40 MG tablet, TAKE 1 TABLET BY MOUTH EVERY DAY, Disp: 30 tablet, Rfl: 0    nitroGLYcerin (NITROSTAT) 0.4 MG SL tablet, 1 tablet (0.4 mg) by Sublingual route every 5 minutes as needed for Chest Pain. up to 3 tabs per episode., Disp: 20 tablet, Rfl: 0    norethindrone (ORTHO MICRONOR) 0.35 MG tablet, Take 1 tablet by mouth daily., Disp: 364 tablet, Rfl: 0    omeprazole (PRILOSEC) 20 MG capsule, Take 1 capsule (20 mg) by mouth every morning (before breakfast)., Disp: 90 capsule, Rfl: 0    rimegepant (NURTEC) tablet, Take 1 tablet as needed for migraine. No more than 1 tablet per 24 hrs, Disp: 8 tablet, Rfl: 5    Semaglutide-Weight Management 0.25 MG/0.5ML SOAJ, Inject 0.25 mg under  the skin every 7 days for 28 days., Disp: 2 mL, Rfl: 0    venlafaxine XR (EFFEXOR XR) 150 MG capsule, Take 1 capsule (150 mg) by mouth daily., Disp: 90 capsule, Rfl: 0    venlafaxine XR (EFFEXOR XR) 75 MG capsule, Take 1 capsule (75 mg) by mouth daily., Disp: 90 capsule, Rfl: 0    verapamil (CALAN SR) 180 MG SR tablet, TAKE 1 TABLET BY MOUTH EVERY DAY, Disp: 30 tablet, Rfl: 0     ALLERGIES:   Gluten meal     FAMILY HISTORY:   family history includes Cancer in her m grandmother; Cholesterol/Lipid Disorder in her mother; Heart Disease in her mother.     SOCIAL HISTORY:    reports that she has never smoked. She has never used smokeless tobacco. She reports that she does not currently use alcohol. She reports that she does not use drugs.      ROS:  Pertinent positives and negatives noted in HPI, otherwise reviewed and negative.    VITALS:  [Today's height/weight self reported per patient]  Vitals:    04/04/23 1035   Weight: 99.8 kg (220 lb)   Height: 5\' 1"  (1.549 m)     Blood Pressure   07/23/22 (!) 116/53   10/10/21 108/71   06/09/21 90/62     Pulse Readings from Last 3 Encounters:   07/23/22 75   10/10/21 69   06/09/21 71     The ASCVD Risk score (Arnett DK, et al., 2019) failed to calculate for the following reasons:    The 2019 ASCVD risk score is only valid for ages 68 to 27      PHYSICAL EXAMINATION [exam limited due to virtual assessment]:  Physical Exam  General: Respiratory rate WNL, no acute distress, sitting comfortably  Head: Normocephalic, atraumatic  Eyes: EOMI, no eye injection, no eyelid swelling, no icterus  EENT: Mucous membranes moist, no visible lip cracking, no active nasal drainage  Neck: Supple, no visible goiter  Respiratory: No retractions, no nasal flaring, normal overall work of breathing  Cardio: No cyanosis  Skin: No visible rash or lesions, hair normal appearance   Psych: Normal affect, mood, and speech  Neuro: CN II-XII grossly intact      ASSESSMENT AND PLAN:  Kristen Olson  is a 27 year old female morbidly obese with CAD co-morbidities who would benefit from weight loss for quality of life and current/potential weight-related co-morbidities, with a goal of achieving a normal BMI  to improve quality of life and decrease obesity-related complications.  Continue currently lifestyle recommendations and medication management if indicated.     Wt Readings from Last 3 Encounters:   04/04/23 99.8 kg (220 lb)   12/20/22 99.8 kg (220 lb)   07/22/22 95.1 kg (209 lb 10.5 oz)     Body mass index is 41.57 kg/m.    1. Morbid obesity (CMS-HCC)  Insurance requires participation in medically supervised weight loss program for 3 months prior to covering medication. She is participating in 150 minutes of exercise per week. Will scheduled for RD for nutrition education.     2. Prediabetes  Begin Metformin   - metFORMIN (GLUCOPHAGE) 500 mg tablet; Take 1 tablet (500 mg) by mouth 2 times daily (with meals) for 30 days.  Dispense: 60 tablet; Refill: 0    3. Hx of cardiac arrest  S/p ICD placement. Will resubmit [id number] for secondary prevention of cardiovascular disease   - Semaglutide-Weight Management 0.25 MG/0.5ML SOAJ; Inject 0.25 mg under the skin every 7 days for 28 days.  Dispense: 2 mL; Refill: 0  - Consult/Referral to Other Clinic    4. Coronary artery disease of native heart with stable angina pectoris, unspecified vessel or lesion type (CMS-HCC)  S/p ICD placement. Will resubmit [id number] for secondary prevention of cardiovascular disease   - Semaglutide-Weight Management 0.25 MG/0.5ML SOAJ; Inject 0.25 mg under the skin every 7 days for 28 days.  Dispense: 2 mL; Refill: 0  - Consult/Referral to Other Clinic    FOLLOW UP  Return in about 4 weeks (around 05/02/2023).

## 2023-04-06 ENCOUNTER — Telehealth (INDEPENDENT_AMBULATORY_CARE_PROVIDER_SITE_OTHER): Payer: Self-pay | Admitting: Physician Assistant

## 2023-04-06 NOTE — Telephone Encounter (Signed)
LVMTCB    If patient calls back please schedule her with one of our nutritionist/dieticians

## 2023-04-09 ENCOUNTER — Encounter (INDEPENDENT_AMBULATORY_CARE_PROVIDER_SITE_OTHER): Payer: Self-pay | Admitting: Physician Assistant

## 2023-04-09 DIAGNOSIS — R7303 Prediabetes: Secondary | ICD-10-CM

## 2023-04-11 MED ORDER — METFORMIN HCL 500 MG OR TB24
500.0000 mg | ORAL_TABLET | Freq: Two times a day (BID) | ORAL | 0 refills | Status: DC
Start: 2023-04-11 — End: 2023-06-21

## 2023-04-12 ENCOUNTER — Encounter (INDEPENDENT_AMBULATORY_CARE_PROVIDER_SITE_OTHER): Payer: Self-pay | Admitting: Physician Assistant

## 2023-05-02 ENCOUNTER — Encounter (INDEPENDENT_AMBULATORY_CARE_PROVIDER_SITE_OTHER): Payer: Self-pay | Admitting: Physician Assistant

## 2023-05-02 IMAGING — US US OB Complete (Anatomy Scan)
1 of 2 series · 13 of 28 positions shown · non-contrast
Comparison: 11/03/2021.

REASON FOR EXAM: Anatomy screening.
TECHNIQUE: High resolution grayscale and color-flow transabdominal pregnancy ultrasound was
performed.
CLINICAL DATES: 20 weeks 2 days, EDD: 05/23/2022
Number: single intrauterine
Presentation: variable
Placenta: fundal
Cardiac activity: 139 bpm
Amniotic Fluid: Adequate, Largest Vertical Fluid Pocket: 4.3 cm
Cervical length: 4.1 cm.
BPD: 5.2 cm - 21 weeks [AGE]th percentile
HC: 18.5 cm - 21 weeks 0 days, 71st percentile
AC: 16.2 cm - 21 weeks 3 days, 76th percentile
FL: 3.7 cm - 22 weeks [AGE]th percentile
EGA from current exam:       21 weeks 4 days gestation
EDD from current exam:        05/14/2022
EFW:                  430 g +/- 63 g, 96th percentile according to clinical dates
STRUCTURAL SURVEY:
Three vessel cord: visualized
Cord insertion: visualized
Cavum septum pellucidum: visualized
Lateral ventricles: visualized
Cerebellum: visualized
Nose/Lips: visualized
Spine: visualized
Aortic arch: Visualized
4 chamber heart: visualized
Left ventricular outflow tract: visualized
Right ventricular outflow tract: visualized
Diaphragm: visualized
Stomach: visualized
Kidneys: visualized
Bladder: visualized
Upper extremities: visualized
Lower extremities: visualized
Hands: visualized
Feet: visualized

[Series 1: us ob complete (anatomy scan) · 13 of 72 slices shown]
[im 3/72]
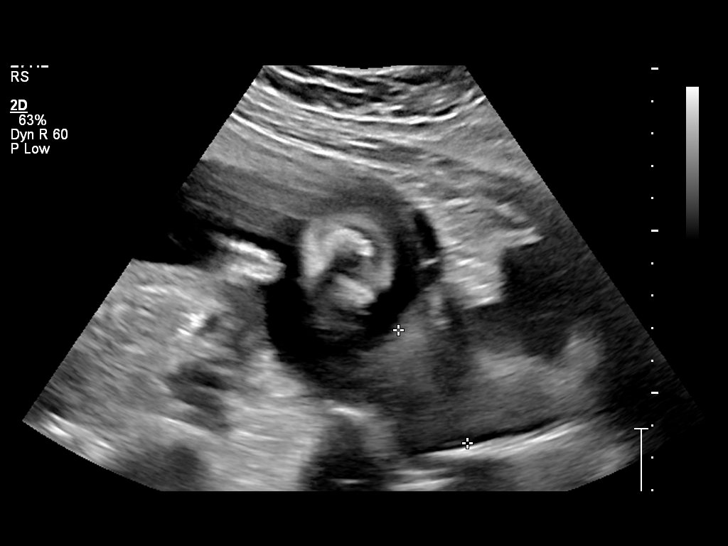
[im 9/72]
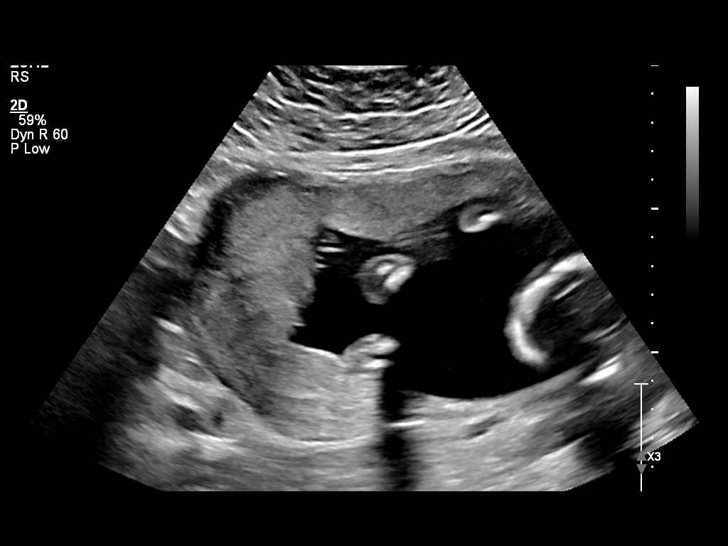
[im 14/72]
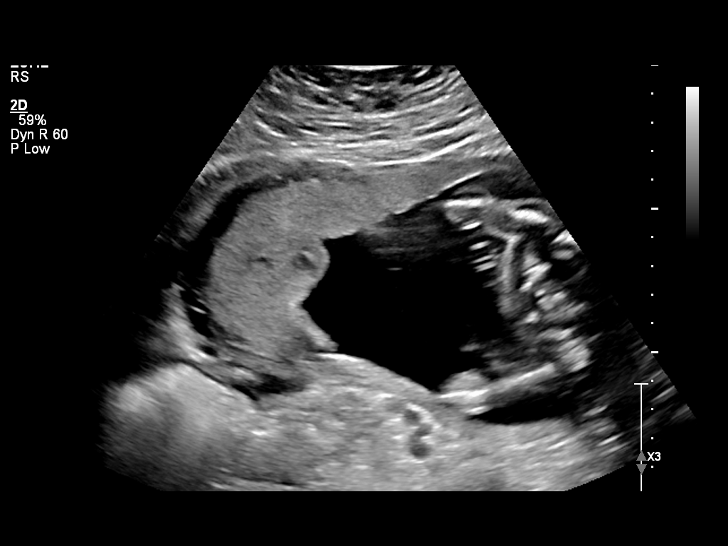
[im 20/72]
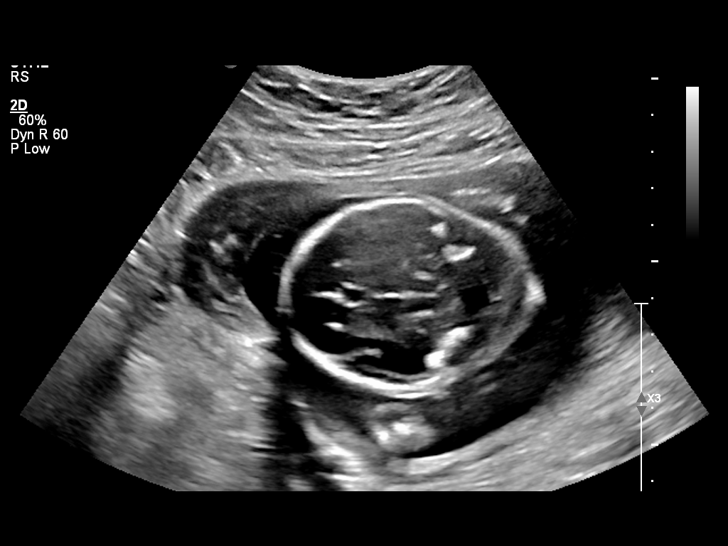
[im 25/72]
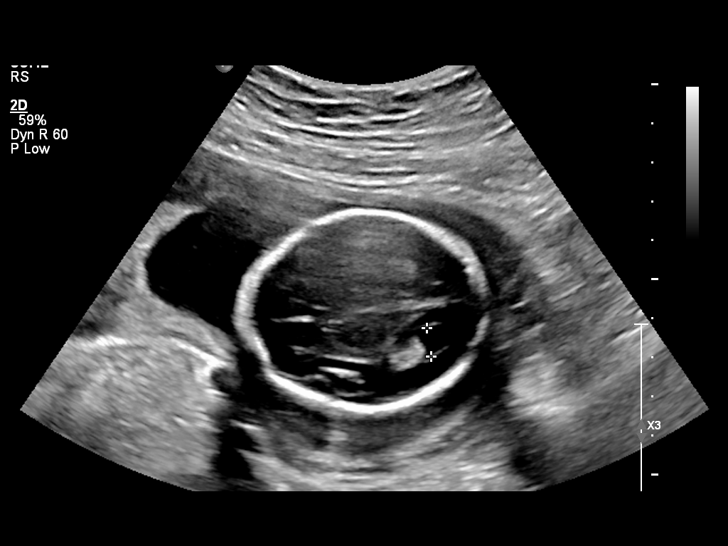
[im 31/72]
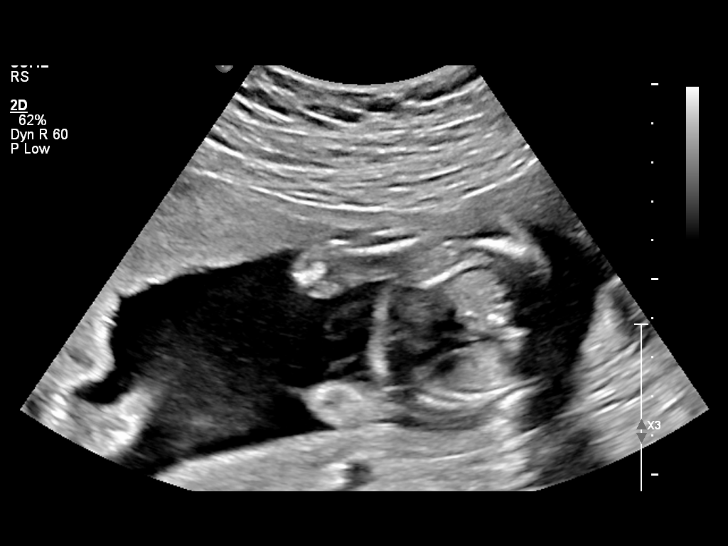
[im 39/72]
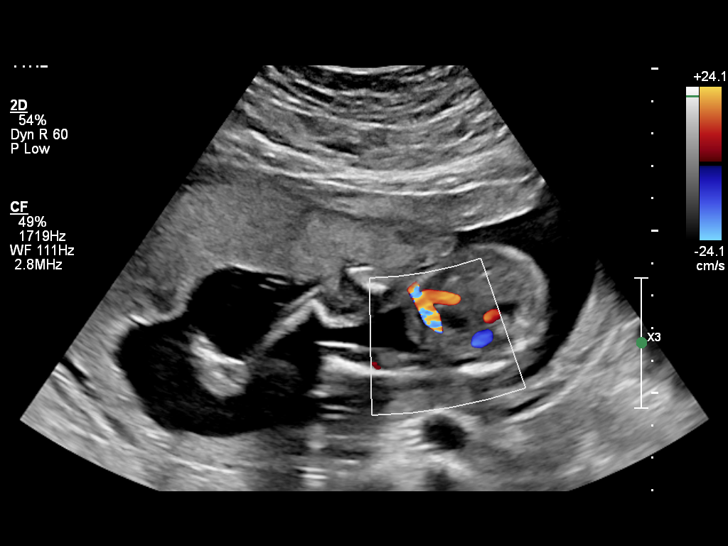
[im 44/72]
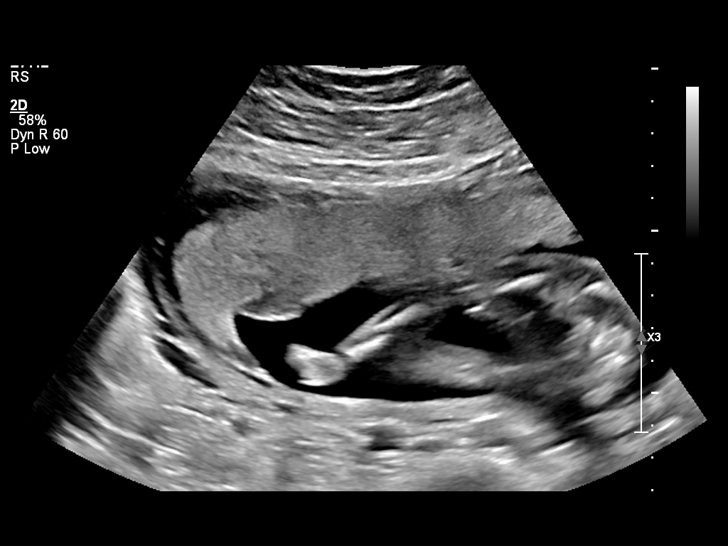
[im 50/72]
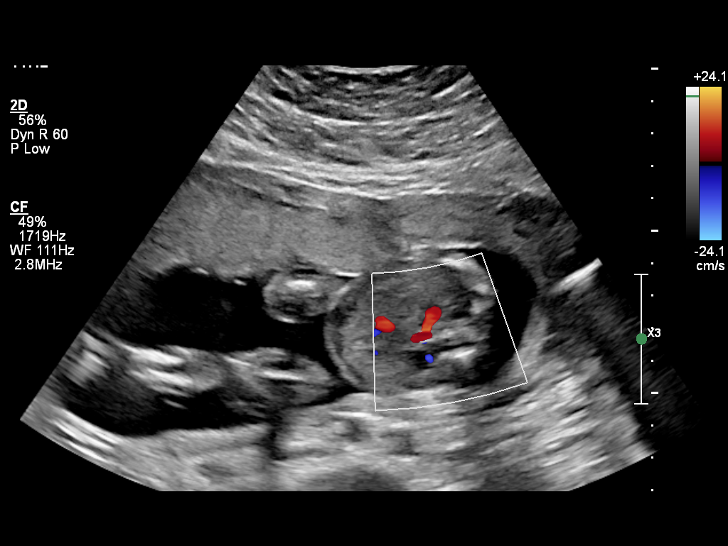
[im 55/72]
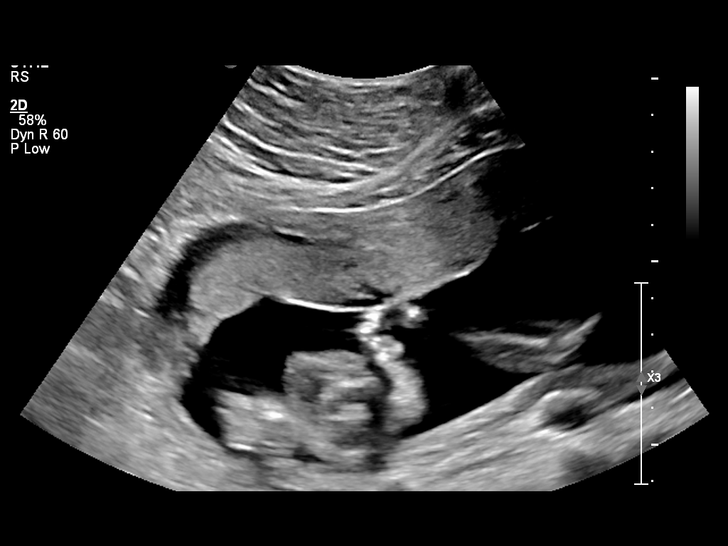
[im 61/72]
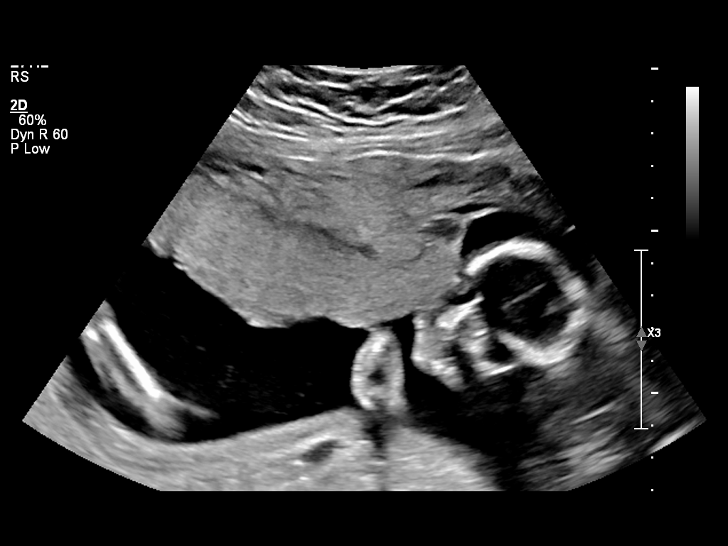
[im 66/72]
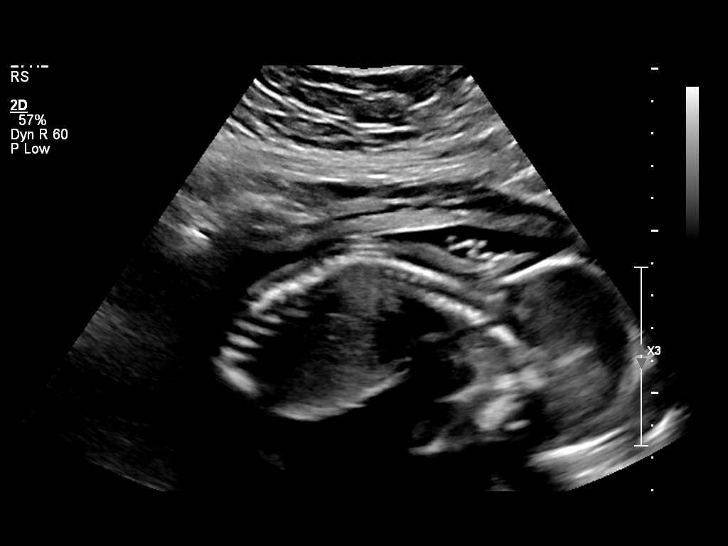
[im 72/72]
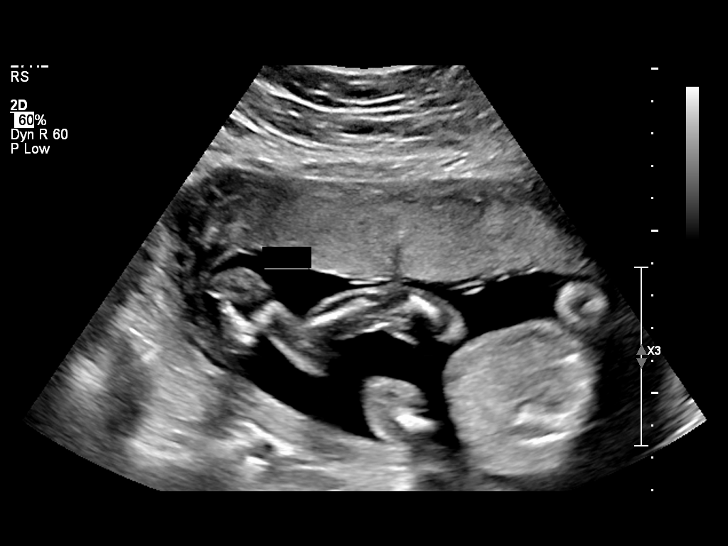

[13 of 28 positions shown; findings below may reference images not displayed]

IMPRESSION: 1. Single intrauterine pregnancy at approximately 21 weeks 4 days with an EDD of 05/14/2022. These
are within range of clinical dates.
2. Visualization of anatomic structures as described. No definite abnormality was appreciated.

## 2023-05-02 NOTE — Progress Notes (Deleted)
VIDEO VISIT: Patient consented to this service being conducted by interactive realtime audio and video communication.      HPI  Kristen Olson is a 27 year old female being seen virtually for follow-up at the [facility].  Patient's current BMI is There is no height or weight on file to calculate BMI. , categorizing them as morbidly obese .  Patient has a goal of achieving a normal BMI [18.5-24.9] to improve quality of life and reduce obesity-related complications.  Patient currently has CAD co-morbidities.  Patient is currently adjusting food intake, exercise, and behavior modification as discussed to achieve goals.       Weight Loss Medication:   None. Medication denied because she has not participated in a medically supervised program for at least 3 months prior or for at least 6 months in the past year.   Phentermine - CI due to cardiac disease. Pt has ICD in place for management ventricular arrhythmia   Qsymia - CI due to cardiac disease   Wellbutrin - already using medication, ineffective for weight loss  Contrave - using separate rx for Wellbutrin, ineffective for weight loss   Topiramate used for migraines in 2014 for 1 year,  caused side effects so it was discontinued    Current Food Intake:    Limiting portion size. Protein with each meal    Current Exercise:   Amount and Type: currently in a mental health facility. Doing 5 days a week of exercise either yoga, walking or circuit training.     Recent weights [Today's weight self reported per patient]:  Wt Readings from Last 3 Encounters:   04/04/23 99.8 kg (220 lb)   12/20/22 99.8 kg (220 lb)   07/22/22 95.1 kg (209 lb 10.5 oz)       Current BMI:  There is no height or weight on file to calculate BMI.    Goal:   Normal BMI [18.5-24.9]      PAST MEDICAL HISTORY:  Past Medical History:   Diagnosis Date    Depression     Migraine     Spinal lordosis     Tachycardia     Hx Tachycardia, coronary artery spasms - sees  cardiologist, on meds. Defibrillator placed 09/2017.       CURRENT MEDICATIONS:     Current Outpatient Medications:     albuterol 108 (90 Base) MCG/ACT inhaler, Inhale 1 puff by mouth every 6 hours as needed for Wheezing., Disp: 6.7 g, Rfl: 0    buPROPion (WELLBUTRIN XL) 300 MG XL tablet, Take 1 tablet (300 mg) by mouth every morning., Disp: 90 tablet, Rfl: 0    docusate sodium (COLACE) 250 MG capsule, TAKE 1 CAPSULE (250 MG) BY MOUTH 2 TIMES DAILY, Disp: 60 capsule, Rfl: 2    hydrOXYzine HCL (ATARAX) 10 MG tablet, Take 1 tablet (10 mg) by mouth every 8 hours as needed., Disp: , Rfl:     lasmiditan (REYVOW) 100 MG tablet, Take 1 tablet (100 mg) by mouth as needed for Migraine., Disp: 8 tablet, Rfl: 5    lithium (ESKALITH) 150 MG capsule, , Disp: , Rfl:     Magnesium Oxide 500 MG CAPS, TAKE 1 CAPSULE BY MOUTH EVERY DAY, Disp: 30 capsule, Rfl: 11    metFORMIN (GLUCOPHAGE XR) 500 mg XR tablet, Take 1 tablet (500 mg) by mouth 2 times daily for 30 days., Disp: 60 tablet, Rfl: 0    montelukast (SINGULAIR) 10 MG tablet, Take 1 tablet (10  mg) by mouth every evening., Disp: 90 tablet, Rfl: 0    MOTEGRITY 2 MG TABS, TAKE 1 TABLET (2 MG) BY MOUTH DAILY, Disp: 30 each, Rfl: 2    Multiple Vitamin (ONE-A-DAY ESSENTIAL) TABS, Take 1 tablet by mouth daily., Disp: , Rfl:     nadolol (CORGARD) 40 MG tablet, TAKE 1 TABLET BY MOUTH EVERY DAY, Disp: 30 tablet, Rfl: 0    nitroGLYcerin (NITROSTAT) 0.4 MG SL tablet, 1 tablet (0.4 mg) by Sublingual route every 5 minutes as needed for Chest Pain. up to 3 tabs per episode., Disp: 20 tablet, Rfl: 0    norethindrone (ORTHO MICRONOR) 0.35 MG tablet, Take 1 tablet by mouth daily., Disp: 364 tablet, Rfl: 0    omeprazole (PRILOSEC) 20 MG capsule, Take 1 capsule (20 mg) by mouth every morning (before breakfast)., Disp: 90 capsule, Rfl: 0    rimegepant (NURTEC) tablet, Take 1 tablet as needed for migraine. No more than 1 tablet per 24 hrs, Disp: 8 tablet, Rfl: 5    Semaglutide-Weight Management 0.25  MG/0.5ML SOAJ, Inject 0.25 mg under the skin every 7 days for 28 days., Disp: 2 mL, Rfl: 0    venlafaxine XR (EFFEXOR XR) 150 MG capsule, Take 1 capsule (150 mg) by mouth daily., Disp: 90 capsule, Rfl: 0    venlafaxine XR (EFFEXOR XR) 75 MG capsule, Take 1 capsule (75 mg) by mouth daily., Disp: 90 capsule, Rfl: 0    verapamil (CALAN SR) 180 MG SR tablet, TAKE 1 TABLET BY MOUTH EVERY DAY, Disp: 30 tablet, Rfl: 0     ALLERGIES:   Gluten meal     FAMILY HISTORY:   family history includes Cancer in her m grandmother; Cholesterol/Lipid Disorder in her mother; Heart Disease in her mother.     SOCIAL HISTORY:    reports that she has never smoked. She has never used smokeless tobacco. She reports that she does not currently use alcohol. She reports that she does not use drugs.      ROS:  Pertinent positives and negatives noted in HPI, otherwise reviewed and negative.    VITALS:  [Today's height/weight self reported per patient]  There were no vitals filed for this visit.    Blood Pressure   07/23/22 (!) 116/53   10/10/21 108/71   06/09/21 90/62     Pulse Readings from Last 3 Encounters:   07/23/22 75   10/10/21 69   06/09/21 71     The ASCVD Risk score (Arnett DK, et al., 2019) failed to calculate for the following reasons:    The 2019 ASCVD risk score is only valid for ages 1 to 61      PHYSICAL EXAMINATION [exam limited due to virtual assessment]:  Physical Exam  General: Respiratory rate WNL, no acute distress, sitting comfortably  Head: Normocephalic, atraumatic  Eyes: EOMI, no eye injection, no eyelid swelling, no icterus  EENT: Mucous membranes moist, no visible lip cracking, no active nasal drainage  Neck: Supple, no visible goiter  Respiratory: No retractions, no nasal flaring, normal overall work of breathing  Cardio: No cyanosis  Skin: No visible rash or lesions, hair normal appearance   Psych: Normal affect, mood, and speech  Neuro: CN II-XII grossly intact      ASSESSMENT AND PLAN:  Kristen Olson  is a 27 year old female morbidly obese with CAD co-morbidities who would benefit from weight loss for quality of life and current/potential weight-related co-morbidities, with a goal of achieving a normal BMI  to improve quality of life and decrease obesity-related complications.  Continue currently lifestyle recommendations and medication management if indicated.     Wt Readings from Last 3 Encounters:   04/04/23 99.8 kg (220 lb)   12/20/22 99.8 kg (220 lb)   07/22/22 95.1 kg (209 lb 10.5 oz)     There is no height or weight on file to calculate BMI.    1. Morbid obesity (CMS-HCC)  Insurance requires participation in medically supervised weight loss program for 3 months prior to covering medication. She is participating in 150 minutes of exercise per week. Will scheduled for RD for nutrition education.     2. Prediabetes  Begin Metformin   - metFORMIN (GLUCOPHAGE) 500 mg tablet; Take 1 tablet (500 mg) by mouth 2 times daily (with meals) for 30 days.  Dispense: 60 tablet; Refill: 0    3. Hx of cardiac arrest  S/p ICD placement. Will resubmit [id number] for secondary prevention of cardiovascular disease   - Semaglutide-Weight Management 0.25 MG/0.5ML SOAJ; Inject 0.25 mg under the skin every 7 days for 28 days.  Dispense: 2 mL; Refill: 0  - Consult/Referral to Other Clinic    4. Coronary artery disease of native heart with stable angina pectoris, unspecified vessel or lesion type (CMS-HCC)  S/p ICD placement. Will resubmit [id number] for secondary prevention of cardiovascular disease   - Semaglutide-Weight Management 0.25 MG/0.5ML SOAJ; Inject 0.25 mg under the skin every 7 days for 28 days.  Dispense: 2 mL; Refill: 0  - Consult/Referral to Other Clinic    FOLLOW UP  No follow-ups on file.

## 2023-05-10 ENCOUNTER — Other Ambulatory Visit (INDEPENDENT_AMBULATORY_CARE_PROVIDER_SITE_OTHER): Payer: Self-pay | Admitting: Physician Assistant

## 2023-05-10 DIAGNOSIS — R7303 Prediabetes: Secondary | ICD-10-CM

## 2023-06-12 ENCOUNTER — Ambulatory Visit: Admitting: Registered"

## 2023-06-12 ENCOUNTER — Other Ambulatory Visit (INDEPENDENT_AMBULATORY_CARE_PROVIDER_SITE_OTHER): Payer: Self-pay | Admitting: Physician Assistant

## 2023-06-12 DIAGNOSIS — J45909 Unspecified asthma, uncomplicated: Secondary | ICD-10-CM

## 2023-06-12 NOTE — Patient Instructions (Addendum)
 Check out daily photo food tracking option under the health tracking section in pocket doc app.  Include veggies at dinner meal consistently.  Practice therapy tools and skills to support eating at least 3 balanced meals daily.   Continue to experiment with movement/physical activity that you enjoy.  Refer to written education provided via email on USDA myplate model, IBS nutrition therapy and GERD nutrition therapy.

## 2023-06-12 NOTE — Interdisciplinary (Signed)
 [facility] Video Visit    Interactive real time audio and visual communication used for the telehealth visit.   HPI   Kristen Olson is a 28 year old female who presents to [facility] for weight management nutrition visit.    ANTHROPOMETRICS   BMI 41.57  Wt Readings from Last 5 Encounters:   04/04/23 99.8 kg (220 lb)   12/20/22 99.8 kg (220 lb)   07/22/22 95.1 kg (209 lb 10.5 oz)   10/10/21 93 kg (205 lb)   09/16/21 92.1 kg (203 lb)   Height: 5' 1  Ideal Body Weight: 105#+/10%  % Ideal Body Weight: 210%  Patients Desired Weight (if applicable): healthy BMI    MEDICAL / SURGICAL HISTORY    has a past surgical history that includes Cardiac defibrillator placement (09/2017).  Past Medical History:   Diagnosis Date    Depression     Migraine     Spinal lordosis     Tachycardia     Hx Tachycardia, coronary artery spasms - sees cardiologist, on meds. Defibrillator placed 09/2017.      MEDICATIONS   Current Outpatient Medications:     albuterol  108 (90 Base) MCG/ACT inhaler, Inhale 1 puff by mouth every 6 hours as needed for Wheezing., Disp: 6.7 g, Rfl: 0    buPROPion  (WELLBUTRIN  XL) 300 MG XL tablet, Take 1 tablet (300 mg) by mouth every morning., Disp: 90 tablet, Rfl: 0    docusate sodium  (COLACE) 250 MG capsule, TAKE 1 CAPSULE (250 MG) BY MOUTH 2 TIMES DAILY, Disp: 60 capsule, Rfl: 2    hydrOXYzine  HCL (ATARAX ) 10 MG tablet, Take 1 tablet (10 mg) by mouth every 8 hours as needed., Disp: , Rfl:     lasmiditan  (REYVOW ) 100 MG tablet, Take 1 tablet (100 mg) by mouth as needed for Migraine., Disp: 8 tablet, Rfl: 5    lithium  (ESKALITH ) 150 MG capsule, , Disp: , Rfl:     Magnesium  Oxide 500 MG CAPS, TAKE 1 CAPSULE BY MOUTH EVERY DAY, Disp: 30 capsule, Rfl: 11    metFORMIN  (GLUCOPHAGE  XR) 500 mg XR tablet, Take 1 tablet (500 mg) by mouth 2 times daily for 30 days., Disp: 60 tablet, Rfl: 0    montelukast  (SINGULAIR ) 10 MG tablet, Take 1 tablet (10 mg) by mouth every evening., Disp: 90 tablet, Rfl: 0     MOTEGRITY  2 MG TABS, TAKE 1 TABLET (2 MG) BY MOUTH DAILY, Disp: 30 each, Rfl: 2    Multiple Vitamin (ONE-A-DAY ESSENTIAL) TABS, Take 1 tablet by mouth daily., Disp: , Rfl:     nadolol  (CORGARD ) 40 MG tablet, TAKE 1 TABLET BY MOUTH EVERY DAY, Disp: 30 tablet, Rfl: 0    nitroGLYcerin  (NITROSTAT ) 0.4 MG SL tablet, 1 tablet (0.4 mg) by Sublingual route every 5 minutes as needed for Chest Pain. up to 3 tabs per episode., Disp: 20 tablet, Rfl: 0    norethindrone  (ORTHO MICRONOR ) 0.35 MG tablet, Take 1 tablet by mouth daily., Disp: 364 tablet, Rfl: 0    omeprazole  (PRILOSEC) 20 MG capsule, Take 1 capsule (20 mg) by mouth every morning (before breakfast)., Disp: 90 capsule, Rfl: 0    rimegepant (NURTEC) tablet, Take 1 tablet as needed for migraine. No more than 1 tablet per 24 hrs, Disp: 8 tablet, Rfl: 5    venlafaxine  XR (EFFEXOR  XR) 150 MG capsule, Take 1 capsule (150 mg) by mouth daily., Disp: 90 capsule, Rfl: 0    venlafaxine  XR (EFFEXOR  XR) 75 MG capsule, Take  1 capsule (75 mg) by mouth daily., Disp: 90 capsule, Rfl: 0    verapamil  (CALAN  SR) 180 MG SR tablet, TAKE 1 TABLET BY MOUTH EVERY DAY, Disp: 30 tablet, Rfl: 0    BIOCHEMICAL DATA   07/23/22 - BUN 4(L), Cre 0.74, Glu 103(H)    GI CONCERNS / ALLERGIES   Nausea / Vomiting: nausea pt attributed to metformin . Endorsed vomiting ~2x/week over the last month since starting metformin . Pt reported she has discussed this with provider.  Diarrhea / Constipation: IBS - chronic constipation.   Frequent GERD.   Last BM: 3-4 days ago. Takes stool softener and miralax .  Food Allergies / Intolerances: gluten meal    NUTRITION / ACTIVITY LEVEL   Current Food Intake: 2-3 meals daily + maybe a fruit as a snack.   24 hour recall:  Cinnamon oatmeal packet, coffee with creamer  Avocado ranch salad kit  Apple   Breast chicken, white rice    Endorsed under-eating and over-eating on different days. Resorts to soups, cereal on days she is struggling with low appetite d/t struggling with  mental health. Trying to use therapy tools/skills such as opposite action.    Current Fluid Intake: 6-8 bottles of water daily. 1c coffee daily with creamer. Tea or juice occasionally.  Alcohol: Rare. 1-2 drinks every few months.     Current Activity Level: tries to exercise at least 3-4x/week (jogging and yoga). Endorsed depression making it challenging at times.   Sees therapist once a week and is in virtual outpatient program M-F the last few months.  Just established with a new psychiatrist last week.    Food / Exercise History: does not count calories d/t past eating disorder at age [age].   Has worked with dietitian for bariatric surgery prep around a year ago and then insurance would not cover surgery so she stopped.   ESTIMATED NUTRITION NEEDS   Based off of IBW of 48 kg:  Calories: 1200-1440 kcals (25-30kcals/kg)  Protein: 48-58 gms (1-1.2gm/kg)  Fluids: ~1440 mls or per MD (~58ml/kg)    ASSESSMENT AND PLAN    Morbid obesity (CMS-HCC)  Coronary artery disease of native heart with stable angina pectoris, unspecified vessel or lesion type (CMS-HCC)  ICD (implantable cardioverter-defibrillator) in place  Irritable bowel syndrome with constipation  Gastroesophageal reflux disease, unspecified whether esophagitis present  - Written and verbal education provided on USDA myplate model, IBS-C nutrition therapy, GERD nutrition therapy and lifestyle modifications for healthy, gradual, sustainable weight loss. Education provided on food balance and variety, portion sizes, recommended servings of food groups daily, and recommended routine, daily physical activity.    Patient Instructions   Check out daily photo food tracking option under the health tracking section in pocket doc app.  Include veggies at dinner meal consistently.  Practice therapy tools and skills to support eating at least 3 balanced meals daily.   Continue to experiment with movement/physical activity that you enjoy.  Refer to written education  provided via email on USDA myplate model, IBS nutrition therapy and GERD nutrition therapy.     Barriers to learning assessed: None. Patient verbalized understanding of teaching and instructions.  FOLLOW UP   Return in about 2 weeks (around 06/26/2023).    [place] JONELLE Ligas, RD

## 2023-06-12 NOTE — Telephone Encounter (Signed)
 approved

## 2023-06-21 ENCOUNTER — Telehealth: Admitting: Physician Assistant

## 2023-06-21 MED ORDER — SEMAGLUTIDE-WEIGHT MANAGEMENT 0.25 MG/0.5ML SC SOAJ
0.2500 mg | SUBCUTANEOUS | 0 refills | Status: AC
Start: 2023-06-21 — End: 2023-07-19

## 2023-06-21 MED ORDER — METFORMIN HCL 500 MG OR TB24
1000.0000 mg | ORAL_TABLET | Freq: Two times a day (BID) | ORAL | 2 refills | Status: DC
Start: 2023-06-21 — End: 2023-07-20

## 2023-06-21 NOTE — Progress Notes (Signed)
VIDEO VISIT: Patient consented to this service being conducted by interactive realtime audio and video communication.      HPI  Kristen Olson is a 28 year old female being seen virtually for follow-up at the [facility].  Patient's current BMI is Body mass index is 39.49 kg/m. , categorizing them as morbidly obese .  Patient has a goal of achieving a normal BMI [18.5-24.9] to improve quality of life and reduce obesity-related complications.  Patient currently has CAD co-morbidities.  Patient is currently adjusting food intake, exercise, and behavior modification as discussed to achieve goals.       Weight Loss Medication:   Metformin - She is tolerating Metformin ER. -11 lbs. Reginal Lutes approved for secondary prevention, but she hasn't been contacted by the pharmacy to fill med.  Phentermine - CI due to cardiac disease. Pt has ICD in place for management ventricular arrhythmia   Qsymia - CI due to cardiac disease   Wellbutrin - already using medication, ineffective for weight loss  Contrave - using separate rx for Wellbutrin, ineffective for weight loss   Topiramate used for migraines in 2014 for 1 year,  caused side effects so it was discontinued    Current Food Intake:    Limiting portion size. Protein with each meal. Working with RD.     Current Exercise:   Amount and Type:  Doing 3-4 days a week of exercise either yoga, walking or circuit training.     Recent weights [Today's weight self reported per patient]:  Wt Readings from Last 3 Encounters:   06/21/23 94.8 kg (209 lb)   04/04/23 99.8 kg (220 lb)   12/20/22 99.8 kg (220 lb)       Current BMI:  Body mass index is 39.49 kg/m.    Goal:   Normal BMI [18.5-24.9]      PAST MEDICAL HISTORY:  Past Medical History:   Diagnosis Date    Depression     Migraine     Spinal lordosis     Tachycardia     Hx Tachycardia, coronary artery spasms - sees cardiologist, on meds. Defibrillator placed 09/2017.       CURRENT MEDICATIONS:      Current Outpatient Medications:     albuterol 108 (90 Base) MCG/ACT inhaler, Inhale 1 puff by mouth every 6 hours as needed for Wheezing., Disp: 6.7 g, Rfl: 0    buPROPion (WELLBUTRIN XL) 300 MG XL tablet, Take 1 tablet (300 mg) by mouth every morning., Disp: 90 tablet, Rfl: 0    docusate sodium (COLACE) 250 MG capsule, TAKE 1 CAPSULE (250 MG) BY MOUTH 2 TIMES DAILY, Disp: 60 capsule, Rfl: 2    hydrOXYzine HCL (ATARAX) 10 MG tablet, Take 1 tablet (10 mg) by mouth every 8 hours as needed., Disp: , Rfl:     lasmiditan (REYVOW) 100 MG tablet, Take 1 tablet (100 mg) by mouth as needed for Migraine., Disp: 8 tablet, Rfl: 5    lithium (ESKALITH) 150 MG capsule, , Disp: , Rfl:     Magnesium Oxide 500 MG CAPS, TAKE 1 CAPSULE BY MOUTH EVERY DAY, Disp: 30 capsule, Rfl: 11    metFORMIN (GLUCOPHAGE XR) 500 mg XR tablet, Take 2 tablets (1,000 mg) by mouth 2 times daily., Disp: 120 tablet, Rfl: 2    montelukast (SINGULAIR) 10 MG tablet, TAKE 1 TABLET BY MOUTH EVERY EVENING, Disp: 90 tablet, Rfl: 0    MOTEGRITY 2 MG TABS, TAKE 1 TABLET (2 MG) BY  MOUTH DAILY, Disp: 30 each, Rfl: 2    Multiple Vitamin (ONE-A-DAY ESSENTIAL) TABS, Take 1 tablet by mouth daily., Disp: , Rfl:     nadolol (CORGARD) 40 MG tablet, TAKE 1 TABLET BY MOUTH EVERY DAY, Disp: 30 tablet, Rfl: 0    nitroGLYcerin (NITROSTAT) 0.4 MG SL tablet, 1 tablet (0.4 mg) by Sublingual route every 5 minutes as needed for Chest Pain. up to 3 tabs per episode., Disp: 20 tablet, Rfl: 0    norethindrone (ORTHO MICRONOR) 0.35 MG tablet, Take 1 tablet by mouth daily., Disp: 364 tablet, Rfl: 0    omeprazole (PRILOSEC) 20 MG capsule, Take 1 capsule (20 mg) by mouth every morning (before breakfast)., Disp: 90 capsule, Rfl: 0    rimegepant (NURTEC) tablet, Take 1 tablet as needed for migraine. No more than 1 tablet per 24 hrs, Disp: 8 tablet, Rfl: 5    Semaglutide-Weight Management 0.25 MG/0.5ML SOAJ, Inject 0.25 mg under the skin every 7 days for 28 days., Disp: 2 mL, Rfl: 0     venlafaxine XR (EFFEXOR XR) 150 MG capsule, Take 1 capsule (150 mg) by mouth daily., Disp: 90 capsule, Rfl: 0    venlafaxine XR (EFFEXOR XR) 75 MG capsule, Take 1 capsule (75 mg) by mouth daily., Disp: 90 capsule, Rfl: 0    verapamil (CALAN SR) 180 MG SR tablet, TAKE 1 TABLET BY MOUTH EVERY DAY, Disp: 30 tablet, Rfl: 0     ALLERGIES:   Gluten meal     FAMILY HISTORY:   family history includes Cancer in her m grandmother; Cholesterol/Lipid Disorder in her mother; Heart Disease in her mother.     SOCIAL HISTORY:    reports that she has never smoked. She has never used smokeless tobacco. She reports that she does not currently use alcohol. She reports that she does not use drugs.      ROS:  Pertinent positives and negatives noted in HPI, otherwise reviewed and negative.    VITALS:  [Today's height/weight self reported per patient]  Vitals:    06/21/23 0821   Weight: 94.8 kg (209 lb)   Height: 5\' 1"  (1.549 m)       Blood Pressure   07/23/22 (!) 116/53   10/10/21 108/71   06/09/21 90/62     Pulse Readings from Last 3 Encounters:   07/23/22 75   10/10/21 69   06/09/21 71     The ASCVD Risk score (Arnett DK, et al., 2019) failed to calculate for the following reasons:    The 2019 ASCVD risk score is only valid for ages 40 to 84      PHYSICAL EXAMINATION [exam limited due to virtual assessment]:  Physical Exam  General: Respiratory rate WNL, no acute distress, sitting comfortably  Head: Normocephalic, atraumatic  Eyes: EOMI, no eye injection, no eyelid swelling, no icterus  EENT: Mucous membranes moist, no visible lip cracking, no active nasal drainage  Neck: Supple, no visible goiter  Respiratory: No retractions, no nasal flaring, normal overall work of breathing  Cardio: No cyanosis  Skin: No visible rash or lesions, hair normal appearance   Psych: Normal affect, mood, and speech  Neuro: CN II-XII grossly intact      ASSESSMENT AND PLAN:  Kristen Olson is a 28 year old female morbidly obese with CAD  co-morbidities who would benefit from weight loss for quality of life and current/potential weight-related co-morbidities, with a goal of achieving a normal BMI to improve quality of life and decrease obesity-related  complications.  Continue currently lifestyle recommendations and medication management if indicated.     Wt Readings from Last 3 Encounters:   06/21/23 94.8 kg (209 lb)   04/04/23 99.8 kg (220 lb)   12/20/22 99.8 kg (220 lb)     Body mass index is 39.49 kg/m.    Kristen Olson was seen today for weight management.    Diagnoses and all orders for this visit:    Morbid obesity (CMS-HCC)  -     She is motivated to lose weight to improve her cardiac health. She was working with  a Teacher, English as a foreign language, but had an insurance change. She would like to revisit a surgical consult   -     Continue working with RD and meeting exercise goals  -     Consult/Referral to Bariatric Surgery & Weight Management    Prediabetes  -     A1C 5.8%. Recheck in March. Continue Metformin. Titrate strength to 2000mg  daily  -     metFORMIN (GLUCOPHAGE XR) 500 mg XR tablet; Take 2 tablets (1,000 mg) by mouth 2 times daily.    ICD (implantable cardioverter-defibrillator) in place  -     Semaglutide-Weight Management 0.25 MG/0.5ML SOAJ; Inject 0.25 mg under the skin every 7 days for 28 days.    Hx of cardiac arrest  -     Semaglutide-Weight Management 0.25 MG/0.5ML SOAJ; Inject 0.25 mg under the skin every 7 days for 28 days.       FOLLOW UP  Return in about 3 months (around 09/19/2023).

## 2023-06-25 ENCOUNTER — Telehealth (INDEPENDENT_AMBULATORY_CARE_PROVIDER_SITE_OTHER): Admitting: Physician Assistant

## 2023-06-25 ENCOUNTER — Encounter (INDEPENDENT_AMBULATORY_CARE_PROVIDER_SITE_OTHER): Payer: Self-pay | Admitting: Physician Assistant

## 2023-06-26 ENCOUNTER — Ambulatory Visit (INDEPENDENT_AMBULATORY_CARE_PROVIDER_SITE_OTHER): Admitting: Registered"

## 2023-06-26 NOTE — Telephone Encounter (Signed)
Please have patient schedule follow up to discuss new problem

## 2023-06-26 NOTE — Patient Instructions (Addendum)
Begin keeping a daily food and fluid journal.   Include veggies at dinner meal consistently.  Practice therapy tools and skills to support eating at least 3 balanced meals daily.   Continue to experiment with movement/physical activity that you enjoy.  Refer to written education provided via email on USDA myplate model, IBS nutrition therapy and GERD nutrition therapy.

## 2023-06-26 NOTE — Interdisciplinary (Signed)
[facility] Video Visit    Interactive real time audio and visual communication used for the telehealth visit.   HPI   Kristen Olson is a 28 year old female who presents to [facility] for follow up nutrition visit.    ANTHROPOMETRICS   BMI 39.49  Wt Readings from Last 5 Encounters:   06/21/23 94.8 kg (209 lb)   04/04/23 99.8 kg (220 lb)   12/20/22 99.8 kg (220 lb)   07/22/22 95.1 kg (209 lb 10.5 oz)   10/10/21 93 kg (205 lb)   Height: 5\' 1"   Ideal Body Weight: 105#+/10%  Patients Desired Weight (if applicable): healthy BMI    MEDICAL / SURGICAL HISTORY    has a past surgical history that includes Cardiac defibrillator placement (09/2017).  Past Medical History:   Diagnosis Date    Depression     Migraine     Spinal lordosis     Tachycardia     Hx Tachycardia, coronary artery spasms - sees cardiologist, on meds. Defibrillator placed 09/2017.      MEDICATIONS   Current Outpatient Medications:     albuterol 108 (90 Base) MCG/ACT inhaler, Inhale 1 puff by mouth every 6 hours as needed for Wheezing., Disp: 6.7 g, Rfl: 0    buPROPion (WELLBUTRIN XL) 300 MG XL tablet, Take 1 tablet (300 mg) by mouth every morning., Disp: 90 tablet, Rfl: 0    docusate sodium (COLACE) 250 MG capsule, TAKE 1 CAPSULE (250 MG) BY MOUTH 2 TIMES DAILY, Disp: 60 capsule, Rfl: 2    hydrOXYzine HCL (ATARAX) 10 MG tablet, Take 1 tablet (10 mg) by mouth every 8 hours as needed., Disp: , Rfl:     lasmiditan (REYVOW) 100 MG tablet, Take 1 tablet (100 mg) by mouth as needed for Migraine., Disp: 8 tablet, Rfl: 5    lithium (ESKALITH) 150 MG capsule, , Disp: , Rfl:     Magnesium Oxide 500 MG CAPS, TAKE 1 CAPSULE BY MOUTH EVERY DAY, Disp: 30 capsule, Rfl: 11    metFORMIN (GLUCOPHAGE XR) 500 mg XR tablet, Take 2 tablets (1,000 mg) by mouth 2 times daily., Disp: 120 tablet, Rfl: 2    montelukast (SINGULAIR) 10 MG tablet, TAKE 1 TABLET BY MOUTH EVERY EVENING, Disp: 90 tablet, Rfl: 0    MOTEGRITY 2 MG TABS, TAKE 1 TABLET (2 MG) BY MOUTH DAILY,  Disp: 30 each, Rfl: 2    Multiple Vitamin (ONE-A-DAY ESSENTIAL) TABS, Take 1 tablet by mouth daily., Disp: , Rfl:     nadolol (CORGARD) 40 MG tablet, TAKE 1 TABLET BY MOUTH EVERY DAY, Disp: 30 tablet, Rfl: 0    nitroGLYcerin (NITROSTAT) 0.4 MG SL tablet, 1 tablet (0.4 mg) by Sublingual route every 5 minutes as needed for Chest Pain. up to 3 tabs per episode., Disp: 20 tablet, Rfl: 0    norethindrone (ORTHO MICRONOR) 0.35 MG tablet, Take 1 tablet by mouth daily., Disp: 364 tablet, Rfl: 0    omeprazole (PRILOSEC) 20 MG capsule, Take 1 capsule (20 mg) by mouth every morning (before breakfast)., Disp: 90 capsule, Rfl: 0    rimegepant (NURTEC) tablet, Take 1 tablet as needed for migraine. No more than 1 tablet per 24 hrs, Disp: 8 tablet, Rfl: 5    Semaglutide-Weight Management 0.25 MG/0.5ML SOAJ, Inject 0.25 mg under the skin every 7 days for 28 days., Disp: 2 mL, Rfl: 0    venlafaxine XR (EFFEXOR XR) 150 MG capsule, Take 1 capsule (150 mg) by mouth daily., Disp: 90  capsule, Rfl: 0    venlafaxine XR (EFFEXOR XR) 75 MG capsule, Take 1 capsule (75 mg) by mouth daily., Disp: 90 capsule, Rfl: 0    verapamil (CALAN SR) 180 MG SR tablet, TAKE 1 TABLET BY MOUTH EVERY DAY, Disp: 30 tablet, Rfl: 0    BIOCHEMICAL DATA   07/23/22 - BUN 4(L), Cre 0.74, Glu 103(H)     GI CONCERNS / ALLERGIES   Nausea / Vomiting: nausea with medication. denied  Diarrhea / Constipation: IBS - chronic constipation. Last BM yesterday.   Food Allergies / Intolerances: gluten meal    NUTRITION / ACTIVITY LEVEL   Food Intake: 2-3 meals daily + maybe a fruit as a snack. Including veggies with dinner more consistently.     Greek yogurt with fruit in it, granola or oatmeal with cinnamon  Sandwich or salad  Rice, chicken/fish, veggies    Occasionally drinking meal replacement shakes 1-2 times per week if struggling to eat a meal.     Endorsed under-eating and over-eating on different days. Resorts to soups, cereal on days she is struggling with low appetite d/t  struggling with mental health. Trying to use therapy tools/skills such as opposite action.     Current Fluid Intake: 6-8 bottles of water daily. 1c coffee daily with creamer. Tea or juice occasionally.  Alcohol: Rare. 1-2 drinks every few months.      Current Activity Level: tries to exercise at least 3-4x/week (jogging and yoga). Endorsed depression making it challenging at times.   Sees therapist once a week and is in virtual outpatient program M-F the last few months.  Just established with a new psychiatrist last week.     Food / Exercise History: does not count calories d/t past eating disorder at age [age].   Has worked with dietitian for bariatric surgery prep around a year ago and then insurance would not cover surgery so she stopped.   ASSESSMENT AND PLAN    Morbid obesity (CMS-HCC)  Coronary artery disease of native heart with stable angina pectoris, unspecified vessel or lesion type (CMS-HCC)  ICD (implantable cardioverter-defibrillator) in place  Irritable bowel syndrome with constipation  Gastroesophageal reflux disease, unspecified whether esophagitis present  - Continued education provided on USDA myplate model, IBS-C nutrition therapy, GERD nutrition therapy and lifestyle modifications for healthy, gradual, sustainable weight loss. Education provided on food balance and variety, portion sizes, recommended servings of food groups daily, and recommended routine, daily physical activity.    Patient Instructions   Begin keeping a daily food and fluid journal.   Include veggies at dinner meal consistently.  Practice therapy tools and skills to support eating at least 3 balanced meals daily.   Continue to experiment with movement/physical activity that you enjoy.  Refer to written education provided via email on USDA myplate model, IBS nutrition therapy and GERD nutrition therapy.     Barriers to learning assessed: None. Patient verbalized understanding of teaching and instructions.  FOLLOW UP   Return in  about 1 month (around 07/27/2023).    Bernadene Bell, RD

## 2023-06-27 ENCOUNTER — Telehealth: Admitting: Physician Assistant

## 2023-06-27 ENCOUNTER — Encounter (INDEPENDENT_AMBULATORY_CARE_PROVIDER_SITE_OTHER): Payer: Self-pay | Admitting: Physician Assistant

## 2023-06-27 ENCOUNTER — Ambulatory Visit (INDEPENDENT_AMBULATORY_CARE_PROVIDER_SITE_OTHER): Admitting: Registered Nurse

## 2023-06-27 NOTE — Progress Notes (Unsigned)
[facility] Video Visit    Interactive real time audio and visual communication used for the telehealth visit.   HPI   Kristen Olson is a 28 year old female with history of obesity, ventricular fibrillation, implantable cardioverter-defibrillator, depression, anxiety who presents to clinic for joint pain.     The patient reports experiencing severe joint pain and fatigue, which she attributes to long COVID. The chief complaint includes constant pain in multiple joints, including shoulders, hips, legs, and ankles, occurring daily for the past 2 years. Additionally, the patient has been experiencing fatigue and headaches for the same duration. She mentions that her inflammation tests at [facility] were consistently elevated, but no further investigation was conducted.    The patient also discusses ongoing mental health issues, noting recent completion of a residential program that she found unhelpful. She is currently awaiting approval for Transcranial Magnetic Stimulation (TMS) treatments, as previous medication trials have been ineffective. She is followed by psychiatry, She reports her constant pain and fatigue contribute to her mental healthy symptoms. She denies current SI/HI.     The patient expresses significant distress due to the chronic pain, stating it impacts her daily functioning     She denies any rashes, fevers, SOB.       REVIEW OF SYSTEMS    Negative except for those in HPI      PAST MEDICAL AND SURGICAL HISTORY:    has a past surgical history that includes Cardiac defibrillator placement (09/2017) and Gallbladder surgery (01/2023).  Past Medical History:   Diagnosis Date    Depression     Migraine     Spinal lordosis     Tachycardia     Hx Tachycardia, coronary artery spasms - sees cardiologist, on meds. Defibrillator placed 09/2017.        CURRENT MEDICATIONS:    Current Outpatient Medications:     albuterol 108 (90 Base) MCG/ACT inhaler, Inhale 1 puff by mouth every 6 hours as needed  for Wheezing., Disp: 6.7 g, Rfl: 0    buPROPion (WELLBUTRIN XL) 300 MG XL tablet, Take 1 tablet (300 mg) by mouth every morning., Disp: 90 tablet, Rfl: 0    docusate sodium (COLACE) 250 MG capsule, TAKE 1 CAPSULE (250 MG) BY MOUTH 2 TIMES DAILY, Disp: 60 capsule, Rfl: 2    hydrOXYzine HCL (ATARAX) 10 MG tablet, Take 1 tablet (10 mg) by mouth every 8 hours as needed., Disp: , Rfl:     lasmiditan (REYVOW) 100 MG tablet, Take 1 tablet (100 mg) by mouth as needed for Migraine., Disp: 8 tablet, Rfl: 5    lithium (ESKALITH) 150 MG capsule, , Disp: , Rfl:     Magnesium Oxide 500 MG CAPS, TAKE 1 CAPSULE BY MOUTH EVERY DAY, Disp: 30 capsule, Rfl: 11    metFORMIN (GLUCOPHAGE XR) 500 mg XR tablet, Take 2 tablets (1,000 mg) by mouth 2 times daily., Disp: 120 tablet, Rfl: 2    montelukast (SINGULAIR) 10 MG tablet, TAKE 1 TABLET BY MOUTH EVERY EVENING, Disp: 90 tablet, Rfl: 0    MOTEGRITY 2 MG TABS, TAKE 1 TABLET (2 MG) BY MOUTH DAILY, Disp: 30 each, Rfl: 2    Multiple Vitamin (ONE-A-DAY ESSENTIAL) TABS, Take 1 tablet by mouth daily., Disp: , Rfl:     nadolol (CORGARD) 40 MG tablet, TAKE 1 TABLET BY MOUTH EVERY DAY, Disp: 30 tablet, Rfl: 0    nitroGLYcerin (NITROSTAT) 0.4 MG SL tablet, 1 tablet (0.4 mg) by Sublingual route every 5 minutes as  needed for Chest Pain. up to 3 tabs per episode., Disp: 20 tablet, Rfl: 0    norethindrone (ORTHO MICRONOR) 0.35 MG tablet, Take 1 tablet by mouth daily., Disp: 364 tablet, Rfl: 0    omeprazole (PRILOSEC) 20 MG capsule, Take 1 capsule (20 mg) by mouth every morning (before breakfast)., Disp: 90 capsule, Rfl: 0    rimegepant (NURTEC) tablet, Take 1 tablet as needed for migraine. No more than 1 tablet per 24 hrs, Disp: 8 tablet, Rfl: 5    Semaglutide-Weight Management 0.25 MG/0.5ML SOAJ, Inject 0.25 mg under the skin every 7 days for 28 days., Disp: 2 mL, Rfl: 0    venlafaxine XR (EFFEXOR XR) 150 MG capsule, Take 1 capsule (150 mg) by mouth daily., Disp: 90 capsule, Rfl: 0    venlafaxine XR  (EFFEXOR XR) 75 MG capsule, Take 1 capsule (75 mg) by mouth daily., Disp: 90 capsule, Rfl: 0    verapamil (CALAN SR) 180 MG SR tablet, TAKE 1 TABLET BY MOUTH EVERY DAY, Disp: 30 tablet, Rfl: 0     ALLERGIES:  Gluten meal    FAMILY HISTORY:  family history includes Cancer in her m grandmother; Cholesterol/Lipid Disorder in her mother; Heart Disease in her mother.    SOCIAL HISTORY:    reports that she has never smoked. She has never used smokeless tobacco. She reports that she does not currently use alcohol. She reports that she does not use drugs.     PHYSICAL EXAMINATION    There were no vitals filed for this visit.  There is no height or weight on file to calculate BMI.  Wt Readings from Last 5 Encounters:   06/21/23 94.8 kg (209 lb)   04/04/23 99.8 kg (220 lb)   12/20/22 99.8 kg (220 lb)   07/22/22 95.1 kg (209 lb 10.5 oz)   10/10/21 93 kg (205 lb)     Blood Pressure   07/23/22 (!) 116/53   10/10/21 108/71   06/09/21 90/62   03/25/18 100/68   05/08/14 121/67       GEN: in NAD, A&O, pleasant, non-toxic appearing  EENT: No injection or icterus.   Psych: Alert and Oriented. Affect appropriate.       ASSESSMENT AND PLAN    1. Long COVID    - Consult/Referral To Rheumatology Ext Com Non-[facility]    2. Arthralgia, unspecified joint  - Order blood work to rule out autoimmune conditions  - Consider referral to long COVID clinic or rheumatologist pending blood work results  - Await results and follow up to discuss findings and further management  - Antinuclear Antibodies (ANA) w/ Reflex; Future  - TSH and T4 - [facility]; Future  - RF (Rheumatoid Factor) - See Instructions; Future  - Lyme Disease, Antibody Total, w/ Reflex - [facility]  - CCP Antibodies IgG/IgA; Future  - C-Reactive Protein, Blood Green Plasma Separator Tube  - Sedimentation Rate (ESR), Blood Lavender; Future  - HLA B 27 Disease Association - [facility]; Future  - Antinuclear Antibodies (ANA) w/ Reflex  - TSH and T4 - [facility]  - RF (Rheumatoid Factor) - See  Instructions  - CCP Antibodies IgG/IgA  - Sedimentation Rate (ESR), Blood Lavender  - HLA B 27 Disease Association - [facility]  - Consult/Referral To Rheumatology Ext Com Non-[facility]    3. Fatigue, unspecified type    - Antinuclear Antibodies (ANA) w/ Reflex; Future  - TSH and T4 - [facility]; Future  - RF (Rheumatoid Factor) - See Instructions; Future  -  Lyme Disease, Antibody Total, w/ Reflex - [facility]  - CCP Antibodies IgG/IgA; Future  - CBC w/ Diff Lavender; Future  - Antinuclear Antibodies (ANA) w/ Reflex  - TSH and T4 - [facility]  - RF (Rheumatoid Factor) - See Instructions  - CCP Antibodies IgG/IgA  - CBC w/ Diff Lavender  - Consult/Referral To Rheumatology Ext Com Non-[facility]    Patient understands and is agreeable to the treatment plan.  All  questions have been answered thoroughly.    There are no Patient Instructions on file for this visit.    FOLLOW UP   Return for Follow Up, discussion of results.    Nathanial Millman, PA-C

## 2023-06-27 NOTE — Telephone Encounter (Signed)
Scheduled pt 01/29. Pt declined in person. Sch a PD

## 2023-06-28 ENCOUNTER — Ambulatory Visit (INDEPENDENT_AMBULATORY_CARE_PROVIDER_SITE_OTHER): Admitting: Registered Nurse

## 2023-06-29 ENCOUNTER — Ambulatory Visit (INDEPENDENT_AMBULATORY_CARE_PROVIDER_SITE_OTHER)

## 2023-07-04 ENCOUNTER — Encounter (INDEPENDENT_AMBULATORY_CARE_PROVIDER_SITE_OTHER): Payer: Self-pay | Admitting: Physician Assistant

## 2023-07-04 LAB — CBC WITH DIFF, BLOOD
Baso (Absolute): 0 10*3/uL (ref 0.0–0.2)
Basos: 0 %
Eos (Absolute): 0.2 10*3/uL (ref 0.0–0.4)
Eos: 3 %
Hematocrit: 41.9 % (ref 34.0–46.6)
Hemoglobin: 13.5 g/dL (ref 11.1–15.9)
Immature Grans (Abs): 0 10*3/uL (ref 0.0–0.1)
Immature Granulocytes: 0 %
Lymphs (Absolute): 2.7 10*3/uL (ref 0.7–3.1)
Lymphs: 31 %
MCH: 30.1 pg (ref 26.6–33.0)
MCHC: 32.2 g/dL (ref 31.5–35.7)
MCV: 94 fL (ref 79–97)
Monocytes(Absolute): 0.5 10*3/uL (ref 0.1–0.9)
Monocytes: 5 %
Neutrophils (Absolute): 5.4 10*3/uL (ref 1.4–7.0)
Neutrophils: 61 %
Platelets: 275 10*3/uL (ref 150–450)
RBC: 4.48 x10E6/uL (ref 3.77–5.28)
RDW: 13.4 % (ref 11.7–15.4)
WBC: 8.8 10*3/uL (ref 3.4–10.8)

## 2023-07-04 LAB — ANTI-CCP3 IGA/IGG ELISA, LABCORP ASSAY: CCP Antibodies IgG/IgA: 56 U — ABNORMAL HIGH (ref 0–19)

## 2023-07-04 LAB — ANA W/REFLEX TO DSDNA, RNP, SM, SS-A, SS-B - LABCORP: ANA Direct: NEGATIVE

## 2023-07-04 LAB — RF (RHEUMATOID FACTOR), BLOOD: RA Latex Turbid.: <10 [IU]/mL (ref <14.0)

## 2023-07-04 LAB — HLA B 27 DISEASE ASSOCIATION -CONSOLIDATED: HLA-B27: NEGATIVE

## 2023-07-04 LAB — C-REACTIVE PROTEIN, BLOOD: C-Reactive Protein, Quant: 8 mg/L (ref 0–10)

## 2023-07-04 LAB — TSH AND T4 - LABCORP
TSH: 2.27 u[IU]/mL (ref 0.450–4.500)
Thyroxine (T4): 7.4 ug/dL (ref 4.5–12.0)

## 2023-07-04 LAB — SED RATE, BLOOD: Sedimentation Rate-Westergren: 30 mm/h (ref 0–32)

## 2023-07-04 LAB — LYME, TOTAL AB TEST/REFLEX -LABCORP: Lyme Total Antibody EIA: NEGATIVE

## 2023-07-06 NOTE — Telephone Encounter (Signed)
Left pt message to call back and sch a follow up to discuss. Will also send mychart message/

## 2023-07-09 ENCOUNTER — Telehealth: Admitting: Physician Assistant

## 2023-07-09 ENCOUNTER — Encounter (INDEPENDENT_AMBULATORY_CARE_PROVIDER_SITE_OTHER): Admitting: Physician Assistant

## 2023-07-09 ENCOUNTER — Telehealth (INDEPENDENT_AMBULATORY_CARE_PROVIDER_SITE_OTHER): Payer: Self-pay | Admitting: Physician Assistant

## 2023-07-09 ENCOUNTER — Encounter (INDEPENDENT_AMBULATORY_CARE_PROVIDER_SITE_OTHER): Payer: Self-pay | Admitting: Physician Assistant

## 2023-07-09 NOTE — Telephone Encounter (Signed)
Pt rescheduled for 3pm

## 2023-07-09 NOTE — Progress Notes (Signed)
[facility] Phone Visit    Total time spent on phone call with the patient was 10 minutes  Start time 15:05  Stop time 15:15    Interactive real time audio communication used for the telehealth visit and patient gave consent.   HPI   Kristen Olson is a 28 year old female who presents to clinic for Follow Up (Mychart messages )    The patient presents with elevated liver enzymes. She had labs drawn at her neurologist which were significant for elevated ALT 78, AST 43, Alk phos 154. She reports a remote history of fatty liver diagnosed approximately 2 years ago, which was not advanced at the time. An ultrasound was performed then, and she was advised to lose weight and improve her diet. The patient denies alcohol consumption. She denies any RUQ pain. She is not on antibiotics, antiepileptics, NSAIDs, statin, anti-tuberculosis drugs, anti-retroviral treatments or biologics.     The patient also reports a history of fibromyalgia, for which she was recently started on Lyrica by rheumatology. She was also prescribed a short course of prednisone to address morning stiffness and potential early-stage rheumatoid arthritis, pending further testing.    Otherwise patient is doing well and has no other concerns today. Denies any shortness of breath, chest pain, visual changes, or headaches.   REVIEW OF SYSTEMS    Negative except for those in HPI      PAST MEDICAL AND SURGICAL HISTORY:    has a past surgical history that includes Cardiac defibrillator placement (09/2017) and Gallbladder surgery (01/2023).  Past Medical History:   Diagnosis Date    Depression     Migraine     Spinal lordosis     Tachycardia     Hx Tachycardia, coronary artery spasms - sees cardiologist, on meds. Defibrillator placed 09/2017.        CURRENT MEDICATIONS:    Current Outpatient Medications:     albuterol 108 (90 Base) MCG/ACT inhaler, Inhale 1 puff by mouth every 6 hours as needed for Wheezing., Disp: 6.7 g, Rfl: 0    buPROPion  (WELLBUTRIN XL) 300 MG XL tablet, Take 1 tablet (300 mg) by mouth every morning., Disp: 90 tablet, Rfl: 0    docusate sodium (COLACE) 250 MG capsule, TAKE 1 CAPSULE (250 MG) BY MOUTH 2 TIMES DAILY, Disp: 60 capsule, Rfl: 2    hydrOXYzine HCL (ATARAX) 10 MG tablet, Take 1 tablet (10 mg) by mouth every 8 hours as needed., Disp: , Rfl:     lasmiditan (REYVOW) 100 MG tablet, Take 1 tablet (100 mg) by mouth as needed for Migraine., Disp: 8 tablet, Rfl: 5    lithium (ESKALITH) 150 MG capsule, , Disp: , Rfl:     Magnesium Oxide 500 MG CAPS, TAKE 1 CAPSULE BY MOUTH EVERY DAY, Disp: 30 capsule, Rfl: 11    metFORMIN (GLUCOPHAGE XR) 500 mg XR tablet, Take 2 tablets (1,000 mg) by mouth 2 times daily., Disp: 120 tablet, Rfl: 2    montelukast (SINGULAIR) 10 MG tablet, TAKE 1 TABLET BY MOUTH EVERY EVENING, Disp: 90 tablet, Rfl: 0    MOTEGRITY 2 MG TABS, TAKE 1 TABLET (2 MG) BY MOUTH DAILY, Disp: 30 each, Rfl: 2    Multiple Vitamin (ONE-A-DAY ESSENTIAL) TABS, Take 1 tablet by mouth daily., Disp: , Rfl:     nadolol (CORGARD) 40 MG tablet, TAKE 1 TABLET BY MOUTH EVERY DAY, Disp: 30 tablet, Rfl: 0    nitroGLYcerin (NITROSTAT) 0.4 MG SL tablet, 1 tablet (0.4  mg) by Sublingual route every 5 minutes as needed for Chest Pain. up to 3 tabs per episode., Disp: 20 tablet, Rfl: 0    norethindrone (ORTHO MICRONOR) 0.35 MG tablet, Take 1 tablet by mouth daily., Disp: 364 tablet, Rfl: 0    omeprazole (PRILOSEC) 20 MG capsule, Take 1 capsule (20 mg) by mouth every morning (before breakfast)., Disp: 90 capsule, Rfl: 0    predniSONE (DELTASONE) 5 MG tablet, , Disp: , Rfl:     pregabalin (LYRICA) 50 MG capsule, , Disp: , Rfl:     rimegepant (NURTEC) tablet, Take 1 tablet as needed for migraine. No more than 1 tablet per 24 hrs, Disp: 8 tablet, Rfl: 5    Semaglutide-Weight Management 0.25 MG/0.5ML SOAJ, Inject 0.25 mg under the skin every 7 days for 28 days., Disp: 2 mL, Rfl: 0    venlafaxine XR (EFFEXOR XR) 150 MG capsule, Take 1 capsule (150 mg) by  mouth daily., Disp: 90 capsule, Rfl: 0    venlafaxine XR (EFFEXOR XR) 75 MG capsule, Take 1 capsule (75 mg) by mouth daily., Disp: 90 capsule, Rfl: 0    verapamil (CALAN SR) 180 MG SR tablet, TAKE 1 TABLET BY MOUTH EVERY DAY, Disp: 30 tablet, Rfl: 0     ALLERGIES:  Gluten meal    FAMILY HISTORY:  family history includes Cancer in her m grandmother; Cholesterol/Lipid Disorder in her mother; Heart Disease in her mother.    SOCIAL HISTORY:     none     Socioeconomic History    Marital status: Married   Tobacco Use    Smoking status: Never    Smokeless tobacco: Never   Substance and Sexual Activity    Alcohol use: Not Currently    Drug use: Never    Sexual activity: Yes     Partners: Male     Birth control/protection: Pill     Social Determinants of Health     Financial Resource Strain: Not on File (08/31/2022)    Received from [employer], [employer]     Financial Resource Strain: 0   Food Insecurity: Not on File (02/22/2023)    Received from [place], [place]    Food Insecurity     Food: 0   Transportation Needs: Not on File (08/31/2022)    Received from [employer], [employer] Needs     Transportation: 0   Physical Activity: Not on File (08/31/2022)    Received from [place], [place]    Physical Activity     Physical Activity: 0   Stress: Not on File (08/31/2022)    Received from [facility], [place]    Stress     Stress: 0   Social Connections: Not on File (02/16/2023)    Received from [place], [place]    Social Connections     Connectedness: 0   Intimate Partner Violence: Low Risk  (07/22/2022)    [facility] IPV     Have you ever been emotionally or physically abused by your partner or someone important to you?: No     Within the last year have you been hit, slapped, kicked or otherwise physically hurt by someone?: No     Within the last year has anyone forced you to have sexual activities?: No     Are you afraid of your spouse or partner you listed above?: No   Housing Stability: Not on File (08/31/2022)    Received from [place],  [employer] Stability  Housing: 0        PHYSICAL EXAMINATION    There were no vitals filed for this visit.  There is no height or weight on file to calculate BMI.  Wt Readings from Last 5 Encounters:   06/21/23 94.8 kg (209 lb)   04/04/23 99.8 kg (220 lb)   12/20/22 99.8 kg (220 lb)   07/22/22 95.1 kg (209 lb 10.5 oz)   10/10/21 93 kg (205 lb)     Blood Pressure   07/23/22 (!) 116/53   10/10/21 108/71   06/09/21 90/62   03/25/18 100/68   05/08/14 121/67       GEN: speaking in full sentences. No distress noted.        ASSESSMENT AND PLAN    1. Transaminitis  Reviewd outside lab report - ALT 78, AST 43, Alk phos 154, LT/AST ratio of ALT > AST consistent with NASH, ordered liver US to confirm and assess extent.   Will consider referring to hepatology if needed.   Advise weight loss, patient recently started on wegovy. She is picking up from pharmacy next week.   Monitor diet, avoid alcohol.   - hep B surface ag  - Ultrasound Liver  - Hep A antibody    2. Arthralgia, unspecified joint  With elevated CCP antibodies, possible early onset RA.   Followed by rheumatology.     Patient understands and is agreeable to the treatment plan.  All  questions have been answered thoroughly.    There are no Patient Instructions on file for this visit.    FOLLOW UP   No follow-ups on file.    Nathanial Millman, PA-C

## 2023-07-09 NOTE — Telephone Encounter (Signed)
Tried reaching out to pt, no answer. Called x 3 on PD. Sat on PD for 13 mins. Please r/s pt.

## 2023-07-09 NOTE — Progress Notes (Unsigned)
rescheduled

## 2023-07-13 ENCOUNTER — Telehealth (INDEPENDENT_AMBULATORY_CARE_PROVIDER_SITE_OTHER): Admitting: Physician Assistant

## 2023-07-14 ENCOUNTER — Other Ambulatory Visit (INDEPENDENT_AMBULATORY_CARE_PROVIDER_SITE_OTHER): Payer: Self-pay | Admitting: Physician Assistant

## 2023-07-14 DIAGNOSIS — R7303 Prediabetes: Secondary | ICD-10-CM

## 2023-07-19 ENCOUNTER — Telehealth (INDEPENDENT_AMBULATORY_CARE_PROVIDER_SITE_OTHER): Payer: Self-pay | Admitting: Internal Medicine

## 2023-07-19 NOTE — Telephone Encounter (Signed)
[facility] Provider Message    Has there been a previous Telephone/MyChart encounter in the last month for same issue?: No    Caller informed that with few exceptions a visit is preferred and encouraged to schedule a visit: N/A    Who's calling?: Danella Maiers with [employer].    The call is regarding:   Darel Hong called and stated that she is seeing this pt for Pain Physical Therapy and they also offer Pain Behavior therapy and the pt would like to get those behavior therapy services also.    Which provider is this relevant to?: Piedad Climes.  Is this provider in-office today and still seeing patients?: Yes    Ok with a response from a Engineer, site?: Yes    Caller informed that message should be addressed by a provider within 72 business hours?: Yes    If caller is requesting a more urgent response, ROUTE HIGH PRIORITY and inform patient that [facility] will make every effort to address within 24 business hours.    Please ask the caller how they would like to be contacted?: Phone Call. Best call back number: [phone]    Future Appointments   Date Time Provider Department Center   07/24/2023  8:30 AM Drivick, Danna Hefty, RD [facility]       Lorette Ang

## 2023-07-20 ENCOUNTER — Encounter (INDEPENDENT_AMBULATORY_CARE_PROVIDER_SITE_OTHER): Payer: Self-pay | Admitting: Hospital

## 2023-07-20 ENCOUNTER — Encounter (INDEPENDENT_AMBULATORY_CARE_PROVIDER_SITE_OTHER): Payer: Self-pay | Admitting: Physician Assistant

## 2023-07-20 NOTE — Telephone Encounter (Signed)
 This is not my patient, I've never seen her and she has no pending appointments

## 2023-07-24 ENCOUNTER — Telehealth (INDEPENDENT_AMBULATORY_CARE_PROVIDER_SITE_OTHER): Admitting: Registered"

## 2023-07-30 ENCOUNTER — Ambulatory Visit (INDEPENDENT_AMBULATORY_CARE_PROVIDER_SITE_OTHER): Admitting: Registered"

## 2023-07-30 NOTE — Patient Instructions (Addendum)
 Keep a daily food, fluid, and physical activity journal.   Limit caffeine intake.  Do not skip meals.  Consume small, balanced meals and snacks around 5-6 times per day spread evenly throughout the day.    Refer to written education provided via email on USDA myplate model, IBS nutrition therapy and GERD nutrition therapy.

## 2023-07-30 NOTE — Interdisciplinary (Signed)
 [facility] Video Visit    Interactive real time audio and visual communication used for the telehealth visit.   HPI   Kristen Olson is a 28 year old female who presents to [facility] for follow up nutrition visit.    ANTHROPOMETRICS   BMI 39.49  Wt Readings from Last 5 Encounters:   06/21/23 94.8 kg (209 lb)   04/04/23 99.8 kg (220 lb)   12/20/22 99.8 kg (220 lb)   07/22/22 95.1 kg (209 lb 10.5 oz)   10/10/21 93 kg (205 lb)   Height: 5\' 1"   Ideal Body Weight: 105#+/10%  Patients Desired Weight (if applicable): healthy BMI    MEDICAL / SURGICAL HISTORY    has a past surgical history that includes Cardiac defibrillator placement (09/2017) and Gallbladder surgery (01/2023).  Past Medical History:   Diagnosis Date    Depression     Migraine     Spinal lordosis     Tachycardia     Hx Tachycardia, coronary artery spasms - sees cardiologist, on meds. Defibrillator placed 09/2017.      MEDICATIONS   Current Outpatient Medications:     albuterol 108 (90 Base) MCG/ACT inhaler, Inhale 1 puff by mouth every 6 hours as needed for Wheezing., Disp: 6.7 g, Rfl: 0    buPROPion (WELLBUTRIN XL) 300 MG XL tablet, Take 1 tablet (300 mg) by mouth every morning., Disp: 90 tablet, Rfl: 0    docusate sodium (COLACE) 250 MG capsule, TAKE 1 CAPSULE (250 MG) BY MOUTH 2 TIMES DAILY, Disp: 60 capsule, Rfl: 2    hydrOXYzine HCL (ATARAX) 10 MG tablet, Take 1 tablet (10 mg) by mouth every 8 hours as needed., Disp: , Rfl:     lasmiditan (REYVOW) 100 MG tablet, Take 1 tablet (100 mg) by mouth as needed for Migraine., Disp: 8 tablet, Rfl: 5    lithium (ESKALITH) 150 MG capsule, , Disp: , Rfl:     Magnesium Oxide 500 MG CAPS, TAKE 1 CAPSULE BY MOUTH EVERY DAY, Disp: 30 capsule, Rfl: 11    metFORMIN (GLUCOPHAGE XR) 500 mg XR tablet, Take 2 tablets (1,000 mg) by mouth 2 times daily., Disp: 360 tablet, Rfl: 0    montelukast (SINGULAIR) 10 MG tablet, TAKE 1 TABLET BY MOUTH EVERY EVENING, Disp: 90 tablet, Rfl: 0    MOTEGRITY 2 MG TABS, TAKE  1 TABLET (2 MG) BY MOUTH DAILY, Disp: 30 each, Rfl: 2    Multiple Vitamin (ONE-A-DAY ESSENTIAL) TABS, Take 1 tablet by mouth daily., Disp: , Rfl:     nadolol (CORGARD) 40 MG tablet, TAKE 1 TABLET BY MOUTH EVERY DAY, Disp: 30 tablet, Rfl: 0    nitroGLYcerin (NITROSTAT) 0.4 MG SL tablet, 1 tablet (0.4 mg) by Sublingual route every 5 minutes as needed for Chest Pain. up to 3 tabs per episode., Disp: 20 tablet, Rfl: 0    norethindrone (ORTHO MICRONOR) 0.35 MG tablet, Take 1 tablet by mouth daily., Disp: 364 tablet, Rfl: 0    omeprazole (PRILOSEC) 20 MG capsule, Take 1 capsule (20 mg) by mouth every morning (before breakfast)., Disp: 90 capsule, Rfl: 0    predniSONE (DELTASONE) 5 MG tablet, , Disp: , Rfl:     pregabalin (LYRICA) 50 MG capsule, , Disp: , Rfl:     rimegepant (NURTEC) tablet, Take 1 tablet as needed for migraine. No more than 1 tablet per 24 hrs, Disp: 8 tablet, Rfl: 5    venlafaxine XR (EFFEXOR XR) 150 MG capsule, Take 1 capsule (150 mg)  by mouth daily., Disp: 90 capsule, Rfl: 0    venlafaxine XR (EFFEXOR XR) 75 MG capsule, Take 1 capsule (75 mg) by mouth daily., Disp: 90 capsule, Rfl: 0    verapamil (CALAN SR) 180 MG SR tablet, TAKE 1 TABLET BY MOUTH EVERY DAY, Disp: 30 tablet, Rfl: 0    BIOCHEMICAL DATA   07/23/22 - BUN 4(L), Cre 0.74, Glu 103(H)     GI CONCERNS / ALLERGIES   Nausea / Vomiting: Nausea. Pt reported she unintentionally vomited 2 weeks ago after overeating.  Diarrhea / Constipation: IBS-C  Food Allergies / Intolerances: gluten meal    NUTRITION / ACTIVITY LEVEL   Food Intake: Eating 2-3 meals daily + maybe a fruit or carrots as a snack. Over the last month stopped binge eating mini bundt cakes at night. Including veggies with dinner.      Oatmeal, yogurt OR eggs, fruit  Sandwich w/ ham, cheese, lettuce, tomato OR salad kit   Rice, chicken/fish, frozen steamable mixed veggies      Occasionally drinking meal replacement shakes 1-2 times per week if struggling to eat a meal. Endorsed  under-eating and over-eating on different days. Resorts to soups, cereal on days she is struggling with low appetite d/t struggling with mental health. Trying to use therapy tools/skills such as opposite action.     Current Fluid Intake: ~80oz water daily. ~1-2c coffee daily with creamer(trying to limit). Tea occasionally.  Stopped drinking soda.  Alcohol: Rare. 1-2 drinks every few months.      Current Activity Level: tries to exercise at least 3-4x/week (walking and yoga). Seeing physical therapist once or twice a week and starting aquatic therapy once a week.   Endorsed depression making it challenging at times.   Sees therapist once a week and is in virtual outpatient program M-F the last few months.  Just established with a new psychiatrist last week.     Food / Exercise History: does not count calories d/t past eating disorder at age [age].   Has worked with dietitian for bariatric surgery prep around a year ago and then insurance would not cover surgery so she stopped.   ASSESSMENT AND PLAN    Morbid obesity  Coronary artery disease of native heart with stable angina pectoris, unspecified vessel or lesion type (CMS-HCC)  ICD (implantable cardioverter-defibrillator) in place  Irritable bowel syndrome with constipation  Gastroesophageal reflux disease, unspecified whether esophagitis present  - Continued education provided on USDA myplate model, IBS-C nutrition therapy, GERD nutrition therapy and lifestyle modifications for healthy, gradual, sustainable weight loss. Education provided on food balance and variety, portion sizes, recommended servings of food groups daily, and recommended routine, daily physical activity.    Patient Instructions   Keep a daily food, fluid, and physical activity journal.   Limit caffeine intake.  Do not skip meals.  Consume small, balanced meals and snacks around 5-6 times per day spread evenly throughout the day.    Refer to written education provided via email on USDA myplate  model, IBS nutrition therapy and GERD nutrition therapy.     Barriers to learning assessed: None. Patient verbalized understanding of teaching and instructions.  FOLLOW UP   Return in about 1 month (around 08/30/2023).    Bernadene Bell, RD

## 2023-08-15 ENCOUNTER — Encounter (INDEPENDENT_AMBULATORY_CARE_PROVIDER_SITE_OTHER): Payer: Self-pay | Admitting: Physician Assistant

## 2023-08-15 ENCOUNTER — Telehealth: Admitting: Physician Assistant

## 2023-08-15 MED ORDER — AZITHROMYCIN 250 MG OR TABS
ORAL_TABLET | ORAL | 0 refills | Status: AC
Start: 2023-08-15 — End: 2023-08-20

## 2023-08-15 NOTE — Progress Notes (Signed)
 [facility] Video Visit    Interactive real time audio and visual communication used for the telehealth visit.   HPI   Kristen Olson is a 28 year old female who presents to clinic for cough.     The patient presents with a persistent cough that has been lingering for 3-4 weeks. Initially, the patient experienced fever and other symptoms, which have since resolved. However, the cough has persisted despite using over-the-counter cough drops. The patient reports experiencing occasional shortness of breath and chest tightness. They have been using their albuterol  inhaler as needed, which provides some relief.    The patient also mentions ongoing issues with fibromyalgia and rheumatoid arthritis, for which they are currently seeing a rheumatologist. 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 She is in an IOP program currently and is considering long term disability determination.     Otherwise patient is doing well and has no other concerns today. Denies any shortness of breath, chest pain, visual changes, or headaches.    REVIEW OF SYSTEMS    Negative except for those in HPI      PAST MEDICAL AND SURGICAL HISTORY:    has a past surgical history that includes Cardiac defibrillator placement (09/2017) and Gallbladder surgery (01/2023).  Past Medical History:   Diagnosis Date    Depression     Migraine     Spinal lordosis     Tachycardia     Hx Tachycardia, coronary artery spasms - sees cardiologist, on meds. Defibrillator placed 09/2017.        CURRENT MEDICATIONS:    Current Outpatient Medications:     albuterol  108 (90 Base) MCG/ACT inhaler, Inhale 1 puff by mouth every 6 hours as needed for Wheezing., Disp: 6.7 g, Rfl: 0    azithromycin  (ZITHROMAX ) 250 MG tablet, Take 2 tablets (500 mg) by mouth daily for 1 day, THEN 1  tablet (250 mg) daily for 4 days., Disp: 6 tablet, Rfl: 0    buPROPion  (WELLBUTRIN  XL) 300 MG XL tablet, Take 1 tablet (300 mg) by mouth every morning., Disp: 90 tablet, Rfl: 0    docusate sodium  (COLACE) 250 MG capsule, TAKE 1 CAPSULE (250 MG) BY MOUTH 2 TIMES DAILY, Disp: 60 capsule, Rfl: 2    hydrOXYzine  HCL (ATARAX ) 10 MG tablet, Take 1 tablet (10 mg) by mouth every 8 hours as needed., Disp: , Rfl:     lasmiditan  (REYVOW ) 100 MG tablet, Take 1 tablet (100 mg) by mouth as needed for Migraine., Disp: 8 tablet, Rfl: 5    lithium  (ESKALITH ) 150 MG capsule, , Disp: , Rfl:     Magnesium  Oxide 500 MG CAPS, TAKE 1 CAPSULE BY MOUTH EVERY DAY, Disp: 30 capsule, Rfl: 11    metFORMIN  (GLUCOPHAGE  XR) 500 mg XR tablet, Take 2 tablets (1,000 mg) by mouth 2 times daily., Disp: 360 tablet, Rfl: 0    methotrexate (TREXALL) 2.5 MG tablet, Take 1 tablet (2.5 mg) by mouth, Disp: , Rfl:     montelukast  (SINGULAIR ) 10 MG tablet, TAKE 1 TABLET BY MOUTH EVERY EVENING, Disp: 90 tablet, Rfl: 0    MOTEGRITY  2 MG TABS, TAKE 1 TABLET (2 MG) BY MOUTH DAILY, Disp: 30 each, Rfl: 2    Multiple Vitamin (ONE-A-DAY ESSENTIAL) TABS, Take 1 tablet by mouth daily., Disp: , Rfl:     nadolol  (CORGARD ) 40 MG tablet,  TAKE 1 TABLET BY MOUTH EVERY DAY, Disp: 30 tablet, Rfl: 0    nitroGLYcerin  (NITROSTAT ) 0.4 MG SL tablet, 1 tablet (0.4 mg) by Sublingual route every 5 minutes as needed for Chest Pain. up to 3 tabs per episode., Disp: 20 tablet, Rfl: 0    norethindrone  (ORTHO MICRONOR ) 0.35 MG tablet, Take 1 tablet by mouth daily., Disp: 364 tablet, Rfl: 0    omeprazole  (PRILOSEC) 20 MG capsule, Take 1 capsule (20 mg) by mouth every morning (before breakfast)., Disp: 90 capsule, Rfl: 0    predniSONE (DELTASONE) 5 MG tablet, , Disp: , Rfl:     pregabalin (LYRICA) 50 MG capsule, , Disp: , Rfl:     rimegepant (NURTEC) tablet, Take 1 tablet as needed for migraine. No more than 1 tablet per 24 hrs, Disp: 8 tablet, Rfl: 5    venlafaxine  XR (EFFEXOR  XR) 150 MG  capsule, Take 1 capsule (150 mg) by mouth daily., Disp: 90 capsule, Rfl: 0    venlafaxine  XR (EFFEXOR  XR) 75 MG capsule, Take 1 capsule (75 mg) by mouth daily., Disp: 90 capsule, Rfl: 0    verapamil  (CALAN  SR) 180 MG SR tablet, TAKE 1 TABLET BY MOUTH EVERY DAY, Disp: 30 tablet, Rfl: 0     ALLERGIES:  Gluten meal    FAMILY HISTORY:  family history includes Cancer in her m grandmother; Cholesterol/Lipid Disorder in her mother; Heart Disease in her mother.    SOCIAL HISTORY:    reports that she has never smoked. She has never used smokeless tobacco. She reports that she does not currently use alcohol. She reports that she does not use drugs.     PHYSICAL EXAMINATION    There were no vitals filed for this visit.  There is no height or weight on file to calculate BMI.  Wt Readings from Last 5 Encounters:   06/21/23 94.8 kg (209 lb)   04/04/23 99.8 kg (220 lb)   12/20/22 99.8 kg (220 lb)   07/22/22 95.1 kg (209 lb 10.5 oz)   10/10/21 93 kg (205 lb)     Blood Pressure   07/23/22 (!) 116/53   10/10/21 108/71   06/09/21 90/62   03/25/18 100/68   05/08/14 121/67       GEN: in NAD, A&O, pleasant, non-toxic appearing  EENT: No injection or icterus.   Psych: Alert and Oriented. Affect appropriate.       ASSESSMENT AND PLAN    1. Upper respiratory tract infection, unspecified type  Patient presents with a lingering cough following a febrile illness 3-4 weeks ago. The cough is accompanied by occasional mild shortness of breath and chest tightness. Given the duration and persistence of symptoms, there is concern for a possible secondary bacterial infection.  - Prescribe Z-Pak (azithromycin ) for potential secondary bacterial infection  - Continue use of albuterol  inhaler as needed for shortness of breath  - If no improvement by Monday, obtain chest X-ray  - Follow up if symptoms persist or worsen  - azithromycin  (ZITHROMAX ) 250 MG tablet; Take 2 tablets (500 mg) by mouth daily for 1 day, THEN 1 tablet (250 mg) daily for 4 days.   Dispense: 6 tablet; Refill: 0    2. Depression, unspecified depression type  Patient is currently off work and undergoing Transcranial Magnetic Stimulation (TMS) therapy for depression. They report no change in symptoms yet, despite being less than halfway through the treatment course.  - Continue TMS therapy as prescribed  - Monitor progress and efficacy of TMS  treatment  - Coordinate with psychiatrist regarding ongoing management and potential disability application    3. Encounter for disability determination  Informed patient to send over paperwork/information for disability determination.     -Strict ER precautions provided for patient; if patient develops shortness of breath, chest pain, or worsening of symptoms she is advised to go to the ER for additional evaluation     Patient understands and is agreeable to the treatment plan.  All  questions have been answered thoroughly.    There are no Patient Instructions on file for this visit.    FOLLOW UP   No follow-ups on file.    Armida JAYSON Scull, PA-C

## 2023-08-16 ENCOUNTER — Ambulatory Visit (INDEPENDENT_AMBULATORY_CARE_PROVIDER_SITE_OTHER)

## 2023-08-17 ENCOUNTER — Telehealth (INDEPENDENT_AMBULATORY_CARE_PROVIDER_SITE_OTHER): Admitting: Physician Assistant

## 2023-08-21 ENCOUNTER — Ambulatory Visit (INDEPENDENT_AMBULATORY_CARE_PROVIDER_SITE_OTHER)

## 2023-08-23 ENCOUNTER — Telehealth
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
## 2023-08-23 NOTE — Patient Instructions (Signed)
 It was great to meet you today! Please feel free to schedule your follow up with me via our online scheduler: [email or web address] or call us  at [phone].     SAFETY PLAN    Step 1: Warning signs that a crisis may be developing (thoughts, images, mood, situations, behaviors.)  Not attending appts   Not taking medications  Feeling impulsive  Hopeless    Step 2: Internal coping strategies--things I can do to take my mind off my problems without contacting another person (relaxation technique, physical activity):   Coloring  Listening to Music  Playing with dog    Step 3: People and social settings that provide distraction:   Husband  Dog    Step 4: People whom I can ask for help:   Husband  Therapist    Step 5: Professionals or agencies I can contact during a crisis:  [employer] Line: 418-508-0785  [place]  Suicide Hotline: [address]  [facility] on [facility] [place]):1-(934)231-6629, (434)869-2721     Step 6: Making the environment safe:  Locking up pills    Step 7: The one thing that is most important to me and worth living for is: Dog    Plan was discussed with patient. Patient agreed with all steps and was given a copy.       In the event of an emergency (worsening thoughts to harm self/others, inability to take care of yourself), please call 911 or go to the nearest emergency room.    If you need crisis counseling or referrals you may also call the [facility] and [employer] at [phone]; 24 hours, 7 days a week.      Additional Resources include:   [place]  Suicide Hotline: 323-113-1795  [facility] on [facility] [place]):1-(934)231-6629, (832) 220-7812    For non-urgent issues, you can message me directly via My Chart or contact the Wellness Department at [phone].

## 2023-08-30 ENCOUNTER — Telehealth (INDEPENDENT_AMBULATORY_CARE_PROVIDER_SITE_OTHER)

## 2023-08-30 ENCOUNTER — Ambulatory Visit (INDEPENDENT_AMBULATORY_CARE_PROVIDER_SITE_OTHER)

## 2023-08-30 ENCOUNTER — Encounter (INDEPENDENT_AMBULATORY_CARE_PROVIDER_SITE_OTHER): Payer: Self-pay

## 2023-08-30 ENCOUNTER — Telehealth (INDEPENDENT_AMBULATORY_CARE_PROVIDER_SITE_OTHER): Payer: Self-pay

## 2023-08-30 VITALS — Ht 61.0 in | Wt 210.0 lb

## 2023-08-30 MED ORDER — SEMAGLUTIDE-WEIGHT MANAGEMENT 0.5 MG/0.5ML SC SOAJ
0.5000 mg | SUBCUTANEOUS | 0 refills | Status: AC
Start: 2023-08-30 — End: 2023-09-27

## 2023-08-30 NOTE — Patient Instructions (Signed)
 What is the most important information I should know about semaglutide?  Call your doctor at once if you have signs of a thyroid tumor, such as swelling or a lump in your neck, trouble swallowing, a hoarse voice, or shortness of breath.  What is semaglutide?  Semaglutide (Rybelsus and Ozempic) is used with diet and exercise to improve blood sugar control in adults with type 2 diabetes mellitus (not for type 1 diabetes).  Ozempic is also used to help reduce the risk of serious heart problems such as heart attack or stroke in adults who have type 2 diabetes and heart disease.  Reginal Lutes is used with diet and exercise in people 12 years and older to manage weight in overweight people who also have at least one weight-related medical condition such as type 2 diabetes, high blood pressure, or high cholesterol.  Semaglutide may also be used for purposes not listed in this medication guide.  What should I discuss with my healthcare provider before using semaglutide?  You should not use semaglutide if you are allergic to it, or if you have:  multiple endocrine neoplasia type 2 (tumors in your glands); or  a personal or family history of medullary thyroid carcinoma (a type of thyroid cancer).  Tell your doctor if you have ever had:  a stomach or intestinal disorder;  gallbladder disease;  pancreatitis;  eye problems caused by diabetes (retinopathy); or  kidney disease.  Stop using this medicine at least 2 months before you plan to get pregnant. Ask your doctor for a safer medicine to use during this time. Controlling diabetes is very important during pregnancy, as is gaining the right amount of weight. Even if you are overweight, losing weight during pregnancy could harm the unborn baby.  If you are pregnant, your name may be listed on a pregnancy registry to track the effects of Wegovy on the baby.  Ask a doctor if it is safe to breastfeed while using Ozempic or Wegovy.  You should not breastfeed while using Rybelsus.  How  should I use semaglutide?  Follow all directions on your prescription label and read all medication guides or instruction sheets. Use the medicine exactly as directed. Ask your doctor or pharmacist if you need help. Semaglutide is usually started at a low dose that is gradually increased every 4 weeks to 30 days.  Rybelsus (oral) is taken by mouth, usually once per day when you first wake up and at least 30 minutes before you eat or drink anything. Take with no more than 4 ounces of water. Swallow the tablet whole and do not crush, chew, or break it.  Eat within 30 to 60 minutes after taking Rybelsus.  Ozempic and Wegovy are injected under the skin, usually once per week at any time of the day, with or without food. Use an injection on the same day each week.  Prepare an injection only when you are ready to give it. Call your pharmacist if the medicine looks cloudy, has changed colors, or has particles in it.  Your healthcare provider will show you where to inject semaglutide. Do not inject into the same place two times in a row.  If you choose a different weekly injection day, start your new schedule after at least 2 days have passed since the last injection you gave.  Do not use different brands of semaglutide at the same time.  Blood sugar can be affected by stress, illness, surgery, exercise, alcohol use, or skipping meals.  Low blood  sugar (hypoglycemia) can make you feel very hungry, dizzy, irritable, or shaky. To quickly treat hypoglycemia, eat or drink hard candy, crackers, raisins, fruit juice, or non-diet soda. Your doctor may prescribe glucagon injection in case of severe hypoglycemia.  Tell your doctor if you have frequent symptoms of high blood sugar (hyperglycemia) such as increased thirst or urination. Ask your doctor before changing your dose or medication schedule.  Your treatment may also include diet, exercise, weight control, medical tests, and special medical care.  You may get dehydrated during  prolonged illness. Call your doctor if you are sick with vomiting or diarrhea, or if you eat or drink less than usual.  Store Rybelsus in the original package at room temperature, away from moisture and heat.  Store unopened [employer] or [employer] injection pens in the original carton in a refrigerator, protected from light. Do not use past the expiration date. Throw away an injection pen that has been frozen.  If needed, you may store an unopened Wegovy pen at cool room temperature for up to 28 days. Do not remove the cap until you are ready to use the injection pen. The pen contains a single dose. Throw the pen away after one use, even if there is still medicine left inside.  The Ozempic injection pen contains more than one dose. After your first use, store the pen with the needle removed in a refrigerator or at room temperature. Protect from heat and light. Keep the cap on when not in use. Throw the pen away 56 days after the first use, or if less than 0.25 mg is shown on the dose counter.  Do not reuse a needle. Place it in a puncture-proof "sharps" container and dispose of it following state or local laws. Keep out of the reach of children and pets.  What happens if I miss a dose?  For Rybelsus: Skip the missed dose and use your next dose at the regular time.  For Ozempic: Use the medicine as soon as you can and then go back to your regular schedule. If you are more than 5 days late for the injection, skip the missed dose and return to your regular schedule.  For YWVPXT: Use the medicine as soon as you can and then go back to your regular schedule. If your next dose is due in less than 2 days (48 hours), skip the missed dose and return to your regular schedule.  Do not use two doses of semaglutide at one time.  Call your doctor if you miss more than 2 doses in a row of Wegovy. You may need to restart the medicine at a lower dose to avoid stomach problems.  What happens if I overdose?  Seek emergency medical  attention or call the Poison Help line at [phone].  Overdose may cause severe nausea, vomiting, or low blood sugar.  What should I avoid while using semaglutide?  Never share an injection pen, even if you changed the needle. Sharing this device can pass infection or disease from person to person.  What are the possible side effects of semaglutide?  Get emergency medical help if you have signs of an allergic reaction: hives, itching; dizziness, fast heartbeats; difficult breathing; swelling of your face, lips, tongue, or throat.  Call your doctor at once if you have:  vision changes;  unusual mood changes, thoughts about hurting yourself;  pounding heartbeats or fluttering in your chest;  a light-headed feeling, like you might pass out;  signs of a  thyroid tumor--swelling or a lump in your neck, trouble swallowing, a hoarse voice, feeling short of breath;  symptoms of pancreatitis--severe pain in your upper stomach spreading to your back, nausea with or without vomiting, fast heart rate;  gallbladder problems--upper stomach pain, fever, clay-colored stools, jaundice (yellowing of the skin or eyes);  low blood sugar--headache, hunger, weakness, sweating, confusion, irritability, dizziness, fast heart rate, or feeling jittery;  kidney problems--swelling, urinating less, feeling tired or short of breath; or  stomach flu symptoms--stomach cramps, vomiting, loss of appetite, diarrhea (may be watery or bloody).  Common side effects may include:  low blood sugar (in people with type 2 diabetes);  upset stomach, heartburn, burping, gas, bloating;  nausea, vomiting, stomach pain, loss of appetite;  diarrhea, constipation;  runny nose or sore throat;  stomach flu symptoms; or  headache, dizziness, tiredness.  This is not a complete list of side effects and others may occur. Call your doctor for medical advice about side effects. You may report side effects to FDA at 1-800-FDA-1088.  What other drugs will affect  semaglutide?  Semaglutide can slow your digestion, and it may take longer for your body to absorb any medicines you take by mouth.  Tell your doctor about all your other medicines, especially insulin or other diabetes medicine, such as dulaglutide, exenatide, liraglutide, Byetta, Trulicity, Victoza, and others.  Other drugs may affect semaglutide, including prescription and over-the-counter medicines, vitamins, and herbal products. Tell your doctor about all other medicines you use.  Where can I get more information?  Your doctor or pharmacist can provide more information about semaglutide.    Remember, keep this and all other medicines out of the reach of children, never share your medicines with others, and use this medication only for the indication prescribed.

## 2023-08-30 NOTE — Progress Notes (Signed)
 [facility] Video Visit    Interactive real time audio and visual communication used for the telehealth visit and patient gave consent.  HPI   Kristen Olson is a 28 year old female with history of obesity, GERD, IBS-C, prediabetes, ICD for ventricular arrhythmias, depression who presents to clinic for Weight Management follow up. Patient has a goal of achieving a normal BMI [18.5-24.9] to improve quality of life and reduce obesity-related complications. Current BMI 39.68 kg/m -Class II Obesity. Current weight: 210 lbs . Patient is currently taking Wegovy 0.25mg  and has finished her first month of injections. Patient denies serious or adverse side effects while on medication. Patient denies side effects and endorses no weight loss at this time since last visit. She states she has no changes in appetite at this time.   Patient is currently limiting portion size, increasing lean proteins and adhering to diet plan discussed with RD. Patient states she is doing exercise 3-4 days a week of walking and yoga. Patient states she is taking vitamin D supplement daily. No other changes to medical history. Patient denies pregnancy, planning to become pregnant or breastfeeding. No suicidal ideations reported.           REVIEW OF SYSTEMS    Review of Systems  Negative except for those in HPI  Denies   History of gallstones or pancreatitis  Hypersensitivity to drug or ingredient  Pregnancy, planning to get pregnant, or breastfeeding  Personal or family history of medullary thyroid cancer  Multiple endocrine neoplasia syndrome type 2  Suicidal ideation or history of suicidal ideation/attempt      PAST MEDICAL AND SURGICAL HISTORY:    has a past surgical history that includes Cardiac defibrillator placement (09/2017) and Gallbladder surgery (01/2023).  Patient Active Problem List   Diagnosis    Surveillance for birth control, oral contraceptives    Gastroesophageal reflux disease, unspecified whether esophagitis  present    Irritable bowel syndrome with constipation    Lower abdominal pain    Calcium channel blocker overdose, intentional self-harm, initial encounter (CMS-HCC)    ICD (implantable cardioverter-defibrillator) in place    Migraines    Asthma    Depression        CURRENT MEDICATIONS:    Current Outpatient Medications:     albuterol 108 (90 Base) MCG/ACT inhaler, Inhale 1 puff by mouth every 6 hours as needed for Wheezing., Disp: 6.7 g, Rfl: 0    buPROPion (WELLBUTRIN XL) 300 MG XL tablet, Take 1 tablet (300 mg) by mouth every morning., Disp: 90 tablet, Rfl: 0    docusate sodium (COLACE) 250 MG capsule, TAKE 1 CAPSULE (250 MG) BY MOUTH 2 TIMES DAILY, Disp: 60 capsule, Rfl: 2    hydrOXYzine HCL (ATARAX) 10 MG tablet, Take 1 tablet (10 mg) by mouth every 8 hours as needed., Disp: , Rfl:     lasmiditan (REYVOW) 100 MG tablet, Take 1 tablet (100 mg) by mouth as needed for Migraine., Disp: 8 tablet, Rfl: 5    lithium (ESKALITH) 150 MG capsule, , Disp: , Rfl:     Magnesium Oxide 500 MG CAPS, TAKE 1 CAPSULE BY MOUTH EVERY DAY, Disp: 30 capsule, Rfl: 11    metFORMIN (GLUCOPHAGE XR) 500 mg XR tablet, Take 2 tablets (1,000 mg) by mouth 2 times daily., Disp: 360 tablet, Rfl: 0    methotrexate (TREXALL) 2.5 MG tablet, Take 1 tablet (2.5 mg) by mouth, Disp: , Rfl:     montelukast (SINGULAIR) 10 MG tablet, TAKE 1  TABLET BY MOUTH EVERY EVENING, Disp: 90 tablet, Rfl: 0    MOTEGRITY 2 MG TABS, TAKE 1 TABLET (2 MG) BY MOUTH DAILY, Disp: 30 each, Rfl: 2    Multiple Vitamin (ONE-A-DAY ESSENTIAL) TABS, Take 1 tablet by mouth daily., Disp: , Rfl:     nadolol (CORGARD) 40 MG tablet, TAKE 1 TABLET BY MOUTH EVERY DAY, Disp: 30 tablet, Rfl: 0    nitroGLYcerin (NITROSTAT) 0.4 MG SL tablet, 1 tablet (0.4 mg) by Sublingual route every 5 minutes as needed for Chest Pain. up to 3 tabs per episode., Disp: 20 tablet, Rfl: 0    norethindrone (ORTHO MICRONOR) 0.35 MG tablet, Take 1 tablet by mouth daily., Disp: 364 tablet, Rfl: 0    omeprazole  (PRILOSEC) 20 MG capsule, Take 1 capsule (20 mg) by mouth every morning (before breakfast)., Disp: 90 capsule, Rfl: 0    predniSONE (DELTASONE) 5 MG tablet, , Disp: , Rfl:     pregabalin (LYRICA) 50 MG capsule, , Disp: , Rfl:     rimegepant (NURTEC) tablet, Take 1 tablet as needed for migraine. No more than 1 tablet per 24 hrs, Disp: 8 tablet, Rfl: 5    Semaglutide-Weight Management 0.5 MG/0.5ML SOAJ, Inject 0.5 mg under the skin every 7 days for 28 days. Start after 4 weeks of 0.25 mg wegovy, Disp: 2 mL, Rfl: 0    venlafaxine XR (EFFEXOR XR) 150 MG capsule, Take 1 capsule (150 mg) by mouth daily., Disp: 90 capsule, Rfl: 0    venlafaxine XR (EFFEXOR XR) 75 MG capsule, Take 1 capsule (75 mg) by mouth daily., Disp: 90 capsule, Rfl: 0    verapamil (CALAN SR) 180 MG SR tablet, TAKE 1 TABLET BY MOUTH EVERY DAY, Disp: 30 tablet, Rfl: 0     ALLERGIES:  Gluten meal    FAMILY HISTORY:  family history includes Cancer in her m grandmother; Cholesterol/Lipid Disorder in her mother; Heart Disease in her mother.    SOCIAL HISTORY:     none     Socioeconomic History    Marital status: Married   Tobacco Use    Smoking status: Never    Smokeless tobacco: Never   Substance and Sexual Activity    Alcohol use: Not Currently    Drug use: Never    Sexual activity: Yes     Partners: Male     Birth control/protection: Pill     Social Determinants of Health     Financial Resource Strain: Not on File (08/31/2022)    Received from [facility], [employer]     Financial Resource Strain: 0   Food Insecurity: Not on File (02/22/2023)    Received from [place], [place]    Food Insecurity     Food: 0   Transportation Needs: Not on File (08/31/2022)    Received from [facility], [employer] [facility]     Transportation: 0   Physical Activity: Not on File (08/31/2022)    Received from [place], [place]    Physical Activity     Physical Activity: 0   Stress: Not on File (08/31/2022)    Received from [facility], [place]    Stress     Stress: 0   Social  Connections: Not on File (02/16/2023)    Received from [place], [place]    Social Connections     Connectedness: 0   Intimate Partner Violence: Low Risk  (07/22/2022)    [facility] IPV     Have you ever  been emotionally or physically abused by your partner or someone important to you?: No     Within the last year have you been hit, slapped, kicked or otherwise physically hurt by someone?: No     Within the last year has anyone forced you to have sexual activities?: No     Are you afraid of your spouse or partner you listed above?: No   Housing Stability: Not on File (08/31/2022)    Received from [place], [facility]    Housing Stability     Housing: 0        PHYSICAL EXAMINATION    There were no vitals filed for this visit.  Body mass index is 39.68 kg/m.  Wt Readings from Last 5 Encounters:   08/30/23 95.3 kg (210 lb)   06/21/23 94.8 kg (209 lb)   04/04/23 99.8 kg (220 lb)   12/20/22 99.8 kg (220 lb)   07/22/22 95.1 kg (209 lb 10.5 oz)     Blood Pressure   07/23/22 (!) 116/53   10/10/21 108/71   06/09/21 90/62   03/25/18 100/68   05/08/14 121/67     Physical Exam  GEN: in NAD, A&O, pleasant, non-toxic appearing  EENT: No injection or icterus.   Psych: Alert and Oriented. Affect appropriate.  Endocrine: No visible goiter  CV: no cyanosis, no c/p reported    ASSESSMENT AND PLAN    1. Encounter for weight management  - continue diet, lifestyle, and behavioral modifications for optimal weight loss. Incorporate lean protein, resistance training, and food diary (MyFitnessPal) to track daily meals.  - Semaglutide-Weight Management 0.5 MG/0.5ML SOAJ; Inject 0.5 mg under the skin every 7 days for 28 days. Start after 4 weeks of 0.25 mg wegovy  Dispense: 2 mL; Refill: 0  -Include Vitamin B12 supplementation while on Wegovy .   2. Prediabetes  - Semaglutide-Weight Management 0.5 MG/0.5ML SOAJ; Inject 0.5 mg under the skin every 7 days for 28 days. Start after 4 weeks of 0.25 mg wegovy  Dispense: 2 mL; Refill: 0    3. Depression, unspecified  depression type  -Patient reports no SI at this time. Patient education provided to seek medical attention immediately if thoughts of harming self or SI occur.   -Patient is compliant with medications at this time.      Patient Instructions   What is the most important information I should know about semaglutide?  Call your doctor at once if you have signs of a thyroid tumor, such as swelling or a lump in your neck, trouble swallowing, a hoarse voice, or shortness of breath.  What is semaglutide?  Semaglutide (Rybelsus and Ozempic) is used with diet and exercise to improve blood sugar control in adults with type 2 diabetes mellitus (not for type 1 diabetes).  Ozempic is also used to help reduce the risk of serious heart problems such as heart attack or stroke in adults who have type 2 diabetes and heart disease.  Reginal Lutes is used with diet and exercise in people 12 years and older to manage weight in overweight people who also have at least one weight-related medical condition such as type 2 diabetes, high blood pressure, or high cholesterol.  Semaglutide may also be used for purposes not listed in this medication guide.  What should I discuss with my healthcare provider before using semaglutide?  You should not use semaglutide if you are allergic to it, or if you have:  multiple endocrine neoplasia type 2 (tumors in your glands); or  a personal or family history of medullary thyroid carcinoma (a type of thyroid cancer).  Tell your doctor if you have ever had:  a stomach or intestinal disorder;  gallbladder disease;  pancreatitis;  eye problems caused by diabetes (retinopathy); or  kidney disease.  Stop using this medicine at least 2 months before you plan to get pregnant. Ask your doctor for a safer medicine to use during this time. Controlling diabetes is very important during pregnancy, as is gaining the right amount of weight. Even if you are overweight, losing weight during pregnancy could harm the unborn  baby.  If you are pregnant, your name may be listed on a pregnancy registry to track the effects of Wegovy on the baby.  Ask a doctor if it is safe to breastfeed while using Ozempic or Wegovy.  You should not breastfeed while using Rybelsus.  How should I use semaglutide?  Follow all directions on your prescription label and read all medication guides or instruction sheets. Use the medicine exactly as directed. Ask your doctor or pharmacist if you need help. Semaglutide is usually started at a low dose that is gradually increased every 4 weeks to 30 days.  Rybelsus (oral) is taken by mouth, usually once per day when you first wake up and at least 30 minutes before you eat or drink anything. Take with no more than 4 ounces of water. Swallow the tablet whole and do not crush, chew, or break it.  Eat within 30 to 60 minutes after taking Rybelsus.  Ozempic and Wegovy are injected under the skin, usually once per week at any time of the day, with or without food. Use an injection on the same day each week.  Prepare an injection only when you are ready to give it. Call your pharmacist if the medicine looks cloudy, has changed colors, or has particles in it.  Your healthcare provider will show you where to inject semaglutide. Do not inject into the same place two times in a row.  If you choose a different weekly injection day, start your new schedule after at least 2 days have passed since the last injection you gave.  Do not use different brands of semaglutide at the same time.  Blood sugar can be affected by stress, illness, surgery, exercise, alcohol use, or skipping meals.  Low blood sugar (hypoglycemia) can make you feel very hungry, dizzy, irritable, or shaky. To quickly treat hypoglycemia, eat or drink hard candy, crackers, raisins, fruit juice, or non-diet soda. Your doctor may prescribe glucagon injection in case of severe hypoglycemia.  Tell your doctor if you have frequent symptoms of high blood sugar  (hyperglycemia) such as increased thirst or urination. Ask your doctor before changing your dose or medication schedule.  Your treatment may also include diet, exercise, weight control, medical tests, and special medical care.  You may get dehydrated during prolonged illness. Call your doctor if you are sick with vomiting or diarrhea, or if you eat or drink less than usual.  Store Rybelsus in the original package at room temperature, away from moisture and heat.  Store unopened [employer] or [employer] injection pens in the original carton in a refrigerator, protected from light. Do not use past the expiration date. Throw away an injection pen that has been frozen.  If needed, you may store an unopened Wegovy pen at cool room temperature for up to 28 days. Do not remove the cap until you are ready to use the injection pen. The pen contains  a single dose. Throw the pen away after one use, even if there is still medicine left inside.  The Ozempic injection pen contains more than one dose. After your first use, store the pen with the needle removed in a refrigerator or at room temperature. Protect from heat and light. Keep the cap on when not in use. Throw the pen away 56 days after the first use, or if less than 0.25 mg is shown on the dose counter.  Do not reuse a needle. Place it in a puncture-proof "sharps" container and dispose of it following state or local laws. Keep out of the reach of children and pets.  What happens if I miss a dose?  For Rybelsus: Skip the missed dose and use your next dose at the regular time.  For Ozempic: Use the medicine as soon as you can and then go back to your regular schedule. If you are more than 5 days late for the injection, skip the missed dose and return to your regular schedule.  For QIONGE: Use the medicine as soon as you can and then go back to your regular schedule. If your next dose is due in less than 2 days (48 hours), skip the missed dose and return to your regular  schedule.  Do not use two doses of semaglutide at one time.  Call your doctor if you miss more than 2 doses in a row of Wegovy. You may need to restart the medicine at a lower dose to avoid stomach problems.  What happens if I overdose?  Seek emergency medical attention or call the Poison Help line at [phone].  Overdose may cause severe nausea, vomiting, or low blood sugar.  What should I avoid while using semaglutide?  Never share an injection pen, even if you changed the needle. Sharing this device can pass infection or disease from person to person.  What are the possible side effects of semaglutide?  Get emergency medical help if you have signs of an allergic reaction: hives, itching; dizziness, fast heartbeats; difficult breathing; swelling of your face, lips, tongue, or throat.  Call your doctor at once if you have:  vision changes;  unusual mood changes, thoughts about hurting yourself;  pounding heartbeats or fluttering in your chest;  a light-headed feeling, like you might pass out;  signs of a thyroid tumor--swelling or a lump in your neck, trouble swallowing, a hoarse voice, feeling short of breath;  symptoms of pancreatitis--severe pain in your upper stomach spreading to your back, nausea with or without vomiting, fast heart rate;  gallbladder problems--upper stomach pain, fever, clay-colored stools, jaundice (yellowing of the skin or eyes);  low blood sugar--headache, hunger, weakness, sweating, confusion, irritability, dizziness, fast heart rate, or feeling jittery;  kidney problems--swelling, urinating less, feeling tired or short of breath; or  stomach flu symptoms--stomach cramps, vomiting, loss of appetite, diarrhea (may be watery or bloody).  Common side effects may include:  low blood sugar (in people with type 2 diabetes);  upset stomach, heartburn, burping, gas, bloating;  nausea, vomiting, stomach pain, loss of appetite;  diarrhea, constipation;  runny nose or sore throat;  stomach flu  symptoms; or  headache, dizziness, tiredness.  This is not a complete list of side effects and others may occur. Call your doctor for medical advice about side effects. You may report side effects to FDA at 1-800-FDA-1088.  What other drugs will affect semaglutide?  Semaglutide can slow your digestion, and it may take longer for your body  to absorb any medicines you take by mouth.  Tell your doctor about all your other medicines, especially insulin or other diabetes medicine, such as dulaglutide, exenatide, liraglutide, Byetta, Trulicity, Victoza, and others.  Other drugs may affect semaglutide, including prescription and over-the-counter medicines, vitamins, and herbal products. Tell your doctor about all other medicines you use.  Where can I get more information?  Your doctor or pharmacist can provide more information about semaglutide.    Remember, keep this and all other medicines out of the reach of children, never share your medicines with others, and use this medication only for the indication prescribed.    FOLLOW UP   Return in about 3 weeks (around 09/20/2023).    Lenore Manner, NP

## 2023-08-30 NOTE — Telephone Encounter (Signed)
 [facility] Encounter Note    Previous Telephone/MyChart encounter created for same issue?  No    Please describe the action taken by yourself in detail:  patient missed call to get roomed for her visit.today at 5pm. Transferred to [facility]lness    This is just documentation in the patient chart, no further action is required.    My phone extension, if further information is needed: [phone]    Clarissa Campa

## 2023-09-03 ENCOUNTER — Telehealth (INDEPENDENT_AMBULATORY_CARE_PROVIDER_SITE_OTHER): Admitting: Registered"

## 2023-09-10 ENCOUNTER — Encounter (INDEPENDENT_AMBULATORY_CARE_PROVIDER_SITE_OTHER): Payer: Self-pay | Admitting: Physician Assistant

## 2023-09-13 ENCOUNTER — Ambulatory Visit (INDEPENDENT_AMBULATORY_CARE_PROVIDER_SITE_OTHER)

## 2023-09-14 ENCOUNTER — Encounter (INDEPENDENT_AMBULATORY_CARE_PROVIDER_SITE_OTHER): Payer: Self-pay

## 2023-09-14 ENCOUNTER — Telehealth

## 2023-09-14 VITALS — Ht 61.0 in | Wt 210.0 lb

## 2023-09-14 MED ORDER — VITAMIN D-3 125 MCG (5000 UT) PO TABS
1.0000 | ORAL_TABLET | Freq: Every day | ORAL | 3 refills | Status: AC
Start: 2023-09-14 — End: ?

## 2023-09-14 MED ORDER — SEMAGLUTIDE-WEIGHT MANAGEMENT 0.5 MG/0.5ML SC SOAJ
0.5000 mg | SUBCUTANEOUS | 0 refills | Status: AC
Start: 2023-09-24 — End: 2023-10-22

## 2023-09-14 NOTE — Progress Notes (Signed)
 [facility] Video Visit    Interactive real time audio and visual communication used for the telehealth visit and patient gave consent.  HPI   Kristen Olson is a 28 year old female with history of Obesity, Prediabetes, ICD,  MDD, GAD, PTSD, past suicide attempt who presents to clinic for Weight Management follow up. Patient has a goal of achieving a normal BMI [18.5-24.9] to improve quality of life and reduce obesity-related complications. She is currently taking Wegovy  0.5mg  .Patient reports she has not lost any weight, and reports experiencing dizziness, diarrhea, vomiting x2 days after first dose of 0.5mg  . These symptoms subsided after 2 days and states she is feeling much better. She reports her food noise is gone.    Patient is seeing registered dietician, and is sticking to diet plan . She is exercising 3-4x a week. Patient states she is seeing a therapist for her depression and reports it is helping a little. She denies SI, or thoughts of harming self.   No other changes reported to medical history.  Current weight: 210lbs        REVIEW OF SYSTEMS    Review of Systems  Negative except for those in HPI  Denies History of gallstones or pancreatitis  Denies Hypersensitivity to drug or ingredient  Denies Personal or family history of medullary thyroid  cancer  Denies Multiple endocrine neoplasia syndrome type 2  Denies Suicidal ideation or history of suicidal ideation/attempt  Denies pregnancy, breastfeeding or planning to become pregnant      PAST MEDICAL AND SURGICAL HISTORY:    has a past surgical history that includes Cardiac defibrillator placement (09/2017) and Gallbladder surgery (01/2023).  Patient Active Problem List   Diagnosis    Surveillance for birth control, oral contraceptives    Gastroesophageal reflux disease, unspecified whether esophagitis present    Irritable bowel syndrome with constipation    Lower abdominal pain    Calcium  channel blocker overdose, intentional self-harm,  initial encounter (CMS-HCC)    ICD (implantable cardioverter-defibrillator) in place    Migraines    Asthma    Depression        CURRENT MEDICATIONS:    Current Outpatient Medications:     albuterol  108 (90 Base) MCG/ACT inhaler, Inhale 1 puff by mouth every 6 hours as needed for Wheezing., Disp: 6.7 g, Rfl: 0    buPROPion  (WELLBUTRIN  XL) 300 MG XL tablet, Take 1 tablet (300 mg) by mouth every morning., Disp: 90 tablet, Rfl: 0    Cholecalciferol (VITAMIN D3) 125 MCG (5000 UT) tablet, Take 1 tablet (5,000 Units) by mouth daily., Disp: 30 tablet, Rfl: 3    docusate sodium  (COLACE) 250 MG capsule, TAKE 1 CAPSULE (250 MG) BY MOUTH 2 TIMES DAILY, Disp: 60 capsule, Rfl: 2    hydrOXYzine  HCL (ATARAX ) 10 MG tablet, Take 1 tablet (10 mg) by mouth every 8 hours as needed., Disp: , Rfl:     lasmiditan  (REYVOW ) 100 MG tablet, Take 1 tablet (100 mg) by mouth as needed for Migraine., Disp: 8 tablet, Rfl: 5    lithium  (ESKALITH ) 150 MG capsule, , Disp: , Rfl:     Magnesium  Oxide 500 MG CAPS, TAKE 1 CAPSULE BY MOUTH EVERY DAY, Disp: 30 capsule, Rfl: 11    methotrexate (TREXALL) 2.5 MG tablet, Take 1 tablet (2.5 mg) by mouth, Disp: , Rfl:     montelukast  (SINGULAIR ) 10 MG tablet, TAKE 1 TABLET BY MOUTH EVERY EVENING, Disp: 90 tablet, Rfl: 0    MOTEGRITY  2 MG  TABS, TAKE 1 TABLET (2 MG) BY MOUTH DAILY, Disp: 30 each, Rfl: 2    Multiple Vitamin (ONE-A-DAY ESSENTIAL) TABS, Take 1 tablet by mouth daily., Disp: , Rfl:     nadolol  (CORGARD ) 40 MG tablet, TAKE 1 TABLET BY MOUTH EVERY DAY, Disp: 30 tablet, Rfl: 0    nitroGLYcerin  (NITROSTAT ) 0.4 MG SL tablet, 1 tablet (0.4 mg) by Sublingual route every 5 minutes as needed for Chest Pain. up to 3 tabs per episode., Disp: 20 tablet, Rfl: 0    norethindrone  (ORTHO MICRONOR ) 0.35 MG tablet, Take 1 tablet by mouth daily., Disp: 364 tablet, Rfl: 0    omeprazole  (PRILOSEC) 20 MG capsule, Take 1 capsule (20 mg) by mouth every morning (before breakfast)., Disp: 90 capsule, Rfl: 0    predniSONE  (DELTASONE) 5 MG tablet, , Disp: , Rfl:     pregabalin (LYRICA) 50 MG capsule, , Disp: , Rfl:     rimegepant (NURTEC) tablet, Take 1 tablet as needed for migraine. No more than 1 tablet per 24 hrs, Disp: 8 tablet, Rfl: 5    [START ON 09/24/2023] Semaglutide -Weight Management 0.5 MG/0.5ML SOAJ, Inject 0.5 mg under the skin every 7 days for 28 days. Start after 4 weeks of 0.25 mg wegovy , Disp: 2 mL, Rfl: 0    Semaglutide -Weight Management 0.5 MG/0.5ML SOAJ, Inject 0.5 mg under the skin every 7 days for 28 days. Start after 4 weeks of 0.25 mg wegovy , Disp: 2 mL, Rfl: 0    venlafaxine  XR (EFFEXOR  XR) 150 MG capsule, Take 1 capsule (150 mg) by mouth daily., Disp: 90 capsule, Rfl: 0    venlafaxine  XR (EFFEXOR  XR) 75 MG capsule, Take 1 capsule (75 mg) by mouth daily., Disp: 90 capsule, Rfl: 0    verapamil  (CALAN  SR) 180 MG SR tablet, TAKE 1 TABLET BY MOUTH EVERY DAY, Disp: 30 tablet, Rfl: 0     ALLERGIES:  Gluten meal    FAMILY HISTORY:  family history includes Cancer in her m grandmother; Cholesterol/Lipid Disorder in her mother; Heart Disease in her mother.    SOCIAL HISTORY:     none     Socioeconomic History    Marital status: Married   Tobacco Use    Smoking status: Never    Smokeless tobacco: Never   Substance and Sexual Activity    Alcohol use: Not Currently    Drug use: Never    Sexual activity: Yes     Partners: Male     Birth control/protection: Pill        PHYSICAL EXAMINATION    There were no vitals filed for this visit.  Body mass index is 39.68 kg/m.  Wt Readings from Last 5 Encounters:   09/14/23 95.3 kg (210 lb)   08/30/23 95.3 kg (210 lb)   06/21/23 94.8 kg (209 lb)   04/04/23 99.8 kg (220 lb)   12/20/22 99.8 kg (220 lb)     Blood Pressure   07/23/22 (!) 116/53   10/10/21 108/71   06/09/21 90/62   03/25/18 100/68   05/08/14 121/67     Physical Exam  GEN: in NAD, A&O, pleasant, non-toxic appearing  EENT: No injection or icterus.   Psych: Alert and Oriented. Affect appropriate.      ASSESSMENT AND PLAN    1.  Class 2 severe obesity with serious comorbidity and body mass index (BMI) of 39.0 to 39.9 in adult, unspecified obesity type (CMS-HCC)  -Continue implementing diet, exercise, and behavioral modifications for optimal weight loss.  -  Incorporate resistance training 3-5 days a week and plenty of lean protein choices to support lean muscle mass and reduce chances of muscle loss while on calorie deficit.   -Incorporate whole foods, vegetables, including green leafy vegetables for adequate B12, Folic acid , and other nutrients essential for metabolic function.   - Semaglutide -Weight Management 0.5 MG/0.5ML SOAJ; Inject 0.5 mg under the skin every 7 days for 28 days. Start after 4 weeks of 0.25 mg wegovy   Dispense: 2 mL; Refill: 0  - Cholecalciferol (VITAMIN D3) 125 MCG (5000 UT) tablet; Take 1 tablet (5,000 Units) by mouth daily.  Dispense: 30 tablet; Refill: 3    2. Prediabetes  -Continue Wegovy , continue diet, exercise, and behavioral modifications.   - Cholecalciferol (VITAMIN D3) 125 MCG (5000 UT) tablet; Take 1 tablet (5,000 Units) by mouth daily.  Dispense: 30 tablet; Refill: 3    3. Episode of recurrent major depressive disorder, unspecified depression episode severity  -Continue seeing therapist and report suicidal ideations or thoughts of harming self or others. Patient verbalized understanding.   -Start Vitamin D  supplement to help improve depression and get plenty or sun exposure.   - Cholecalciferol (VITAMIN D3) 125 MCG (5000 UT) tablet; Take 1 tablet (5,000 Units) by mouth daily.  Dispense: 30 tablet; Refill: 3  Medications: Options, side effects, and contraindications discussed in detail with patient.  wegovy      Priority: Encouraged patient to feel confident in who they are and to prioritize goals of health in order to improve quality of life and decrease obesity-related disease, rather than achieving a scale number or look.  Consider mental and emotional health throughout this process and always report any  concerns.       Goal: Discussed current BMI and goal BMI between 18.5 and 24.9 and achieving 5-10% loss of current body weight.      Food intake: Reviewed the concept of daily output exceeding daily input.  Healthy food intake discussed in detail, including caloric intake goal between TBD, (based on [place] St Jeor equation and current activity level) calories per day for achieving weight loss (DO NOT recommend <400 kcal/day).  Encouraged VEGETABLES and healthy proteins, minimizing sugars and carbohydrates.  Plenty of fluids.  Avoid sugary drinks.  DAILY FOOD JOURNAL (MyFitnessPal is a garment/textile technologist for determining calories).         Exercise: Encouraged regular exercise, starting slow and increasing over time.  Goal of 30+ minutes 5-7 days per week, including both aerobic and resistance exercise.      Behavior Modification: Recommended daily food journal (can use PocketDoc as a tracking resource, app e.g. MyFitnessPal, Calorie Counter - MyNetDiary, or notebook), self-monitoring with self-weighing daily or twice weekly.  Encouraged long-term modification of food intake, physical activity, and behavior.  Counseling and self-help groups have proven to be helpful in this process.       Follow-up: Weekly for the first month, twice monthly during months 2-3, monthly thereafter until goals are achieved, and then as needed.  Recommended letting Primary Care Provider know patient is in Weight Management Program and about any new prescriptions prescribed     Resources: PocketDoc has tools including weight, blood pressure, food, fluids logs as well as appointment availability with our Nutritionist and Personal Trainer Valerie Hagermann.  Using these resources is highly recommended as an adjunct to the plan of care determined with your clinician.        Patient Instructions   What is the most important information I should know about semaglutide ?  Call your doctor at once if you have signs of a thyroid  tumor, such as  swelling or a lump in your neck, trouble swallowing, a hoarse voice, or shortness of breath.  What is semaglutide ?  Semaglutide  (Rybelsus  and Ozempic ) is used with diet and exercise to improve blood sugar control in adults with type 2 diabetes mellitus (not for type 1 diabetes).  Ozempic  is also used to help reduce the risk of serious heart problems such as heart attack or stroke in adults who have type 2 diabetes and heart disease.  Wegovy  is used with diet and exercise in people 12 years and older to manage weight in overweight people who also have at least one weight-related medical condition such as type 2 diabetes, high blood pressure, or high cholesterol.  Semaglutide  may also be used for purposes not listed in this medication guide.  What should I discuss with my healthcare provider before using semaglutide ?  You should not use semaglutide  if you are allergic to it, or if you have:  multiple endocrine neoplasia type 2 (tumors in your glands); or  a personal or family history of medullary thyroid  carcinoma (a type of thyroid  cancer).  Tell your doctor if you have ever had:  a stomach or intestinal disorder;  gallbladder disease;  pancreatitis;  eye problems caused by diabetes (retinopathy); or  kidney disease.  Stop using this medicine at least 2 months before you plan to get pregnant. Ask your doctor for a safer medicine to use during this time. Controlling diabetes is very important during pregnancy, as is gaining the right amount of weight. Even if you are overweight, losing weight during pregnancy could harm the unborn baby.  If you are pregnant, your name may be listed on a pregnancy registry to track the effects of Wegovy  on the baby.  Ask a doctor if it is safe to breastfeed while using Ozempic  or Wegovy .  You should not breastfeed while using Rybelsus .  How should I use semaglutide ?  Follow all directions on your prescription label and read all medication guides or instruction sheets. Use the medicine  exactly as directed. Ask your doctor or pharmacist if you need help. Semaglutide  is usually started at a low dose that is gradually increased every 4 weeks to 30 days.  Rybelsus  (oral) is taken by mouth, usually once per day when you first wake up and at least 30 minutes before you eat or drink anything. Take with no more than 4 ounces of water. Swallow the tablet whole and do not crush, chew, or break it.  Eat within 30 to 60 minutes after taking Rybelsus .  Ozempic  and Wegovy  are injected under the skin, usually once per week at any time of the day, with or without food. Use an injection on the same day each week.  Prepare an injection only when you are ready to give it. Call your pharmacist if the medicine looks cloudy, has changed colors, or has particles in it.  Your healthcare provider will show you where to inject semaglutide . Do not inject into the same place two times in a row.  If you choose a different weekly injection day, start your new schedule after at least 2 days have passed since the last injection you gave.  Do not use different brands of semaglutide  at the same time.  Blood sugar can be affected by stress, illness, surgery, exercise, alcohol use, or skipping meals.  Low blood sugar (hypoglycemia) can make you feel very hungry, dizzy, irritable, or  shaky. To quickly treat hypoglycemia, eat or drink hard candy, crackers, raisins, fruit juice, or non-diet soda. Your doctor may prescribe glucagon  injection in case of severe hypoglycemia.  Tell your doctor if you have frequent symptoms of high blood sugar (hyperglycemia) such as increased thirst or urination. Ask your doctor before changing your dose or medication schedule.  Your treatment may also include diet, exercise, weight control, medical tests, and special medical care.  You may get dehydrated during prolonged illness. Call your doctor if you are sick with vomiting or diarrhea, or if you eat or drink less than usual.  Store Rybelsus  in the  original package at room temperature, away from moisture and heat.  Store unopened Ozempic  or Wegovy  injection pens in the original carton in a refrigerator, protected from light. Do not use past the expiration date. Throw away an injection pen that has been frozen.  If needed, you may store an unopened Wegovy  pen at cool room temperature for up to 28 days. Do not remove the cap until you are ready to use the injection pen. The pen contains a single dose. Throw the pen away after one use, even if there is still medicine left inside.  The Ozempic  injection pen contains more than one dose. After your first use, store the pen with the needle removed in a refrigerator or at room temperature. Protect from heat and light. Keep the cap on when not in use. Throw the pen away 56 days after the first use, or if less than 0.25 mg is shown on the dose counter.  Do not reuse a needle. Place it in a puncture-proof sharps container and dispose of it following state or local laws. Keep out of the reach of children and pets.  What happens if I miss a dose?  For Rybelsus : Skip the missed dose and use your next dose at the regular time.  For Ozempic : Use the medicine as soon as you can and then go back to your regular schedule. If you are more than 5 days late for the injection, skip the missed dose and return to your regular schedule.  For Wegovy : Use the medicine as soon as you can and then go back to your regular schedule. If your next dose is due in less than 2 days (48 hours), skip the missed dose and return to your regular schedule.  Do not use two doses of semaglutide  at one time.  Call your doctor if you miss more than 2 doses in a row of Wegovy . You may need to restart the medicine at a lower dose to avoid stomach problems.  What happens if I overdose?  Seek emergency medical attention or call the Poison Help line at [phone].  Overdose may cause severe nausea, vomiting, or low blood sugar.  What should I avoid while  using semaglutide ?  Never share an injection pen, even if you changed the needle. Sharing this device can pass infection or disease from person to person.  What are the possible side effects of semaglutide ?  Get emergency medical help if you have signs of an allergic reaction: hives, itching; dizziness, fast heartbeats; difficult breathing; swelling of your face, lips, tongue, or throat.  Call your doctor at once if you have:  vision changes;  unusual mood changes, thoughts about hurting yourself;  pounding heartbeats or fluttering in your chest;  a light-headed feeling, like you might pass out;  signs of a thyroid  tumor--swelling or a lump in your neck, trouble swallowing, a  hoarse voice, feeling short of breath;  symptoms of pancreatitis--severe pain in your upper stomach spreading to your back, nausea with or without vomiting, fast heart rate;  gallbladder problems--upper stomach pain, fever, clay-colored stools, jaundice (yellowing of the skin or eyes);  low blood sugar--headache, hunger, weakness, sweating, confusion, irritability, dizziness, fast heart rate, or feeling jittery;  kidney problems--swelling, urinating less, feeling tired or short of breath; or  stomach flu symptoms--stomach cramps, vomiting, loss of appetite, diarrhea (may be watery or bloody).  Common side effects may include:  low blood sugar (in people with type 2 diabetes);  upset stomach, heartburn, burping, gas, bloating;  nausea, vomiting, stomach pain, loss of appetite;  diarrhea, constipation;  runny nose or sore throat;  stomach flu symptoms; or  headache, dizziness, tiredness.  This is not a complete list of side effects and others may occur. Call your doctor for medical advice about side effects. You may report side effects to FDA at 1-800-FDA-1088.  What other drugs will affect semaglutide ?  Semaglutide  can slow your digestion, and it may take longer for your body to absorb any medicines you take by mouth.  Tell your doctor about all  your other medicines, especially insulin  or other diabetes medicine, such as dulaglutide, exenatide, liraglutide, Byetta, Trulicity, Victoza, and others.  Other drugs may affect semaglutide , including prescription and over-the-counter medicines, vitamins, and herbal products. Tell your doctor about all other medicines you use.  Where can I get more information?  Your doctor or pharmacist can provide more information about semaglutide .    Vitamin D  plays a role in metabolism, fat storage, and overall body composition. Here's how it affects weight and what happens when you have a deficiency:  Vitamin D  & Weight  Regulates Fat Storage - Low vitamin D  levels are linked to increased fat accumulation, especially around the abdomen.  Influences Appetite - It may help regulate hunger hormones like leptin, which signals fullness. Deficiency can lead to overeating.  Supports Muscle Function - Adequate levels help maintain muscle mass, which boosts metabolism and calorie burning.  Reduces Inflammation - Chronic inflammation from low vitamin D  can contribute to weight gain and metabolic issues.  Affects Insulin  Sensitivity - Vitamin D  improves insulin  function, reducing the risk of weight gain and type 2 diabetes.  Effects of Vitamin D  Deficiency  Increased Fat Storage - Deficiency is linked to higher body fat percentages.  Fatigue & Weakness - Low energy levels make it harder to stay active, leading to weight gain.  Slow Metabolism - Can affect thyroid  function, slowing down metabolism.  Higher Risk of Obesity - Studies suggest that vitamin D -deficient individuals have a harder time losing weight.      If You're Using Oral Contraceptive Pills (OCPs)  Wegovy  can reduce the absorption of oral medications, especially when you first start it or when you increase the dose.  This means birth control pills may be less effective, especially during nausea, vomiting, or slowed digestion caused by Wegovy .  The manufacturer (Novo [employer]isk)  recommends:  Use a backup contraceptive method for at least 4 weeks after starting Wegovy  and for 4 weeks after each dose increase if you're relying on birth control pills.    ??? Backup Contraceptive Options  If you're using birth control pills, it's a good idea to also use one of the following:  Condoms  Spermicide  Diaphragm  Emergency contraception if needed (Plan B  is still effective -- no known interaction)    ?? Better Options While on Wegovy ?  If you want to skip the backup drama, consider non-oral methods that aren't affected by digestion:  IUD (hormonal or copper)  Implant (Nexplanon)  Birth control shot (Depo-Provera)  Vaginal ring (NuvaRing)  Patch (Xulane)    ?? TL;DR  Wegovy  may reduce absorption of birth control pills, especially early on or with GI side effects  Use backup contraception for at least 4 weeks after starting or increasing Wegovy   No effect on non-oral contraceptives or Plan B   Talk to your provider about switching to a longer-term or non-oral method if needed      FOLLOW UP   Return in about 2 months (around 11/14/2023).    [name], NP

## 2023-09-14 NOTE — Patient Instructions (Addendum)
 What is the most important information I should know about semaglutide ?  Call your doctor at once if you have signs of a thyroid tumor, such as swelling or a lump in your neck, trouble swallowing, a hoarse voice, or shortness of breath.  What is semaglutide ?  Semaglutide  (Rybelsus  and Ozempic ) is used with diet and exercise to improve blood sugar control in adults with type 2 diabetes mellitus (not for type 1 diabetes).  Ozempic  is also used to help reduce the risk of serious heart problems such as heart attack or stroke in adults who have type 2 diabetes and heart disease.  Wegovy  is used with diet and exercise in people 12 years and older to manage weight in overweight people who also have at least one weight-related medical condition such as type 2 diabetes, high blood pressure, or high cholesterol.  Semaglutide  may also be used for purposes not listed in this medication guide.  What should I discuss with my healthcare provider before using semaglutide ?  You should not use semaglutide  if you are allergic to it, or if you have:  multiple endocrine neoplasia type 2 (tumors in your glands); or  a personal or family history of medullary thyroid carcinoma (a type of thyroid cancer).  Tell your doctor if you have ever had:  a stomach or intestinal disorder;  gallbladder disease;  pancreatitis;  eye problems caused by diabetes (retinopathy); or  kidney disease.  Stop using this medicine at least 2 months before you plan to get pregnant. Ask your doctor for a safer medicine to use during this time. Controlling diabetes is very important during pregnancy, as is gaining the right amount of weight. Even if you are overweight, losing weight during pregnancy could harm the unborn baby.  If you are pregnant, your name may be listed on a pregnancy registry to track the effects of Wegovy  on the baby.  Ask a doctor if it is safe to breastfeed while using Ozempic  or Wegovy .  You should not breastfeed while using Rybelsus .  How  should I use semaglutide ?  Follow all directions on your prescription label and read all medication guides or instruction sheets. Use the medicine exactly as directed. Ask your doctor or pharmacist if you need help. Semaglutide  is usually started at a low dose that is gradually increased every 4 weeks to 30 days.  Rybelsus  (oral) is taken by mouth, usually once per day when you first wake up and at least 30 minutes before you eat or drink anything. Take with no more than 4 ounces of water. Swallow the tablet whole and do not crush, chew, or break it.  Eat within 30 to 60 minutes after taking Rybelsus .  Ozempic  and Wegovy  are injected under the skin, usually once per week at any time of the day, with or without food. Use an injection on the same day each week.  Prepare an injection only when you are ready to give it. Call your pharmacist if the medicine looks cloudy, has changed colors, or has particles in it.  Your healthcare provider will show you where to inject semaglutide . Do not inject into the same place two times in a row.  If you choose a different weekly injection day, start your new schedule after at least 2 days have passed since the last injection you gave.  Do not use different brands of semaglutide  at the same time.  Blood sugar can be affected by stress, illness, surgery, exercise, alcohol use, or skipping meals.  Low blood  sugar (hypoglycemia) can make you feel very hungry, dizzy, irritable, or shaky. To quickly treat hypoglycemia, eat or drink hard candy, crackers, raisins, fruit juice, or non-diet soda. Your doctor may prescribe glucagon  injection in case of severe hypoglycemia.  Tell your doctor if you have frequent symptoms of high blood sugar (hyperglycemia) such as increased thirst or urination. Ask your doctor before changing your dose or medication schedule.  Your treatment may also include diet, exercise, weight control, medical tests, and special medical care.  You may get dehydrated during  prolonged illness. Call your doctor if you are sick with vomiting or diarrhea, or if you eat or drink less than usual.  Store Rybelsus  in the original package at room temperature, away from moisture and heat.  Store unopened Ozempic  or Wegovy  injection pens in the original carton in a refrigerator, protected from light. Do not use past the expiration date. Throw away an injection pen that has been frozen.  If needed, you may store an unopened Wegovy  pen at cool room temperature for up to 28 days. Do not remove the cap until you are ready to use the injection pen. The pen contains a single dose. Throw the pen away after one use, even if there is still medicine left inside.  The Ozempic  injection pen contains more than one dose. After your first use, store the pen with the needle removed in a refrigerator or at room temperature. Protect from heat and light. Keep the cap on when not in use. Throw the pen away 56 days after the first use, or if less than 0.25 mg is shown on the dose counter.  Do not reuse a needle. Place it in a puncture-proof "sharps" container and dispose of it following state or local laws. Keep out of the reach of children and pets.  What happens if I miss a dose?  For Rybelsus : Skip the missed dose and use your next dose at the regular time.  For Ozempic : Use the medicine as soon as you can and then go back to your regular schedule. If you are more than 5 days late for the injection, skip the missed dose and return to your regular schedule.  For Wegovy : Use the medicine as soon as you can and then go back to your regular schedule. If your next dose is due in less than 2 days (48 hours), skip the missed dose and return to your regular schedule.  Do not use two doses of semaglutide  at one time.  Call your doctor if you miss more than 2 doses in a row of Wegovy . You may need to restart the medicine at a lower dose to avoid stomach problems.  What happens if I overdose?  Seek emergency medical  attention or call the Poison Help line at [phone].  Overdose may cause severe nausea, vomiting, or low blood sugar.  What should I avoid while using semaglutide ?  Never share an injection pen, even if you changed the needle. Sharing this device can pass infection or disease from person to person.  What are the possible side effects of semaglutide ?  Get emergency medical help if you have signs of an allergic reaction: hives, itching; dizziness, fast heartbeats; difficult breathing; swelling of your face, lips, tongue, or throat.  Call your doctor at once if you have:  vision changes;  unusual mood changes, thoughts about hurting yourself;  pounding heartbeats or fluttering in your chest;  a light-headed feeling, like you might pass out;  signs of a  thyroid tumor--swelling or a lump in your neck, trouble swallowing, a hoarse voice, feeling short of breath;  symptoms of pancreatitis--severe pain in your upper stomach spreading to your back, nausea with or without vomiting, fast heart rate;  gallbladder problems--upper stomach pain, fever, clay-colored stools, jaundice (yellowing of the skin or eyes);  low blood sugar--headache, hunger, weakness, sweating, confusion, irritability, dizziness, fast heart rate, or feeling jittery;  kidney problems--swelling, urinating less, feeling tired or short of breath; or  stomach flu symptoms--stomach cramps, vomiting, loss of appetite, diarrhea (may be watery or bloody).  Common side effects may include:  low blood sugar (in people with type 2 diabetes);  upset stomach, heartburn, burping, gas, bloating;  nausea, vomiting, stomach pain, loss of appetite;  diarrhea, constipation;  runny nose or sore throat;  stomach flu symptoms; or  headache, dizziness, tiredness.  This is not a complete list of side effects and others may occur. Call your doctor for medical advice about side effects. You may report side effects to FDA at 1-800-FDA-1088.  What other drugs will affect  semaglutide ?  Semaglutide  can slow your digestion, and it may take longer for your body to absorb any medicines you take by mouth.  Tell your doctor about all your other medicines, especially insulin  or other diabetes medicine, such as dulaglutide, exenatide, liraglutide, Byetta, Trulicity, Victoza, and others.  Other drugs may affect semaglutide , including prescription and over-the-counter medicines, vitamins, and herbal products. Tell your doctor about all other medicines you use.  Where can I get more information?  Your doctor or pharmacist can provide more information about semaglutide .    Vitamin D  plays a role in metabolism, fat storage, and overall body composition. Here's how it affects weight and what happens when you have a deficiency:  Vitamin D  & Weight  Regulates Fat Storage - Low vitamin D  levels are linked to increased fat accumulation, especially around the abdomen.  Influences Appetite - It may help regulate hunger hormones like leptin, which signals fullness. Deficiency can lead to overeating.  Supports Muscle Function - Adequate levels help maintain muscle mass, which boosts metabolism and calorie burning.  Reduces Inflammation - Chronic inflammation from low vitamin D  can contribute to weight gain and metabolic issues.  Affects Insulin  Sensitivity - Vitamin D  improves insulin  function, reducing the risk of weight gain and type 2 diabetes.  Effects of Vitamin D  Deficiency  Increased Fat Storage - Deficiency is linked to higher body fat percentages.  Fatigue & Weakness - Low energy levels make it harder to stay active, leading to weight gain.  Slow Metabolism - Can affect thyroid function, slowing down metabolism.  Higher Risk of Obesity - Studies suggest that vitamin D -deficient individuals have a harder time losing weight.      If You're Using Oral Contraceptive Pills (OCPs)  Wegovy  can reduce the absorption of oral medications, especially when you first start it or when you increase the  dose.  This means birth control pills may be less effective, especially during nausea, vomiting, or slowed digestion caused by Wegovy .  The manufacturer (Novo Nordisk) recommends:  Use a backup contraceptive method for at least 4 weeks after starting Wegovy  and for 4 weeks after each dose increase if you're relying on birth control pills.    ??? Backup Contraceptive Options  If you're using birth control pills, it's a good idea to also use one of the following:  Condoms  Spermicide  Diaphragm  Emergency contraception if needed (Plan B  is still effective -- no  known interaction)    ?? Better Options While on Wegovy ?  If you want to skip the backup drama, consider non-oral methods that aren't affected by digestion:  IUD (hormonal or copper)  Implant (Nexplanon)  Birth control shot (Depo-Provera)  Vaginal ring (NuvaRing)  Patch (Xulane)    ?? TL;DR  Wegovy  may reduce absorption of birth control pills, especially early on or with GI side effects  Use backup contraception for at least 4 weeks after starting or increasing Wegovy   No effect on non-oral contraceptives or Plan B   Talk to your provider about switching to a longer-term or non-oral method if needed

## 2023-09-19 ENCOUNTER — Other Ambulatory Visit (INDEPENDENT_AMBULATORY_CARE_PROVIDER_SITE_OTHER): Payer: Self-pay | Admitting: Physician Assistant

## 2023-09-19 DIAGNOSIS — J45909 Unspecified asthma, uncomplicated: Secondary | ICD-10-CM

## 2023-09-29 ENCOUNTER — Telehealth (INDEPENDENT_AMBULATORY_CARE_PROVIDER_SITE_OTHER): Payer: Self-pay | Admitting: Physician Assistant

## 2023-09-29 DIAGNOSIS — K581 Irritable bowel syndrome with constipation: Secondary | ICD-10-CM

## 2023-09-29 DIAGNOSIS — R2689 Other abnormalities of gait and mobility: Secondary | ICD-10-CM

## 2023-09-29 NOTE — Telephone Encounter (Signed)
 Kristen Olson Physical therapy at [facility] pain program left a voicemail for provider.     Voicemail states:    I am calling because this patient has 2 conditions that are concerning. She comes to see me again at the [facility] to treat her fibromyalgia and other pain problems, but the patient is also showing vestibular symptoms poor balance and she tests as a fall risk. So I am calling to recommend a referral for vestibular physical therapy evaluation for this patient. She has a second condition that is a form of severe constipation. She reports only having a bell movement once every month. I know that this patient is followed by her dr. and a [facility] specialist for this condition, but she appears to be a client that would be a good candidate for a referral to pelvic floor physical therapy to address her severe constipation. I also understand that she is been diagnosed in the past with a kind of pelvic floor muscle dysfunction. But that was some time ago and she continues to have this severe problem     Kristen Olson call back phone [phone]

## 2023-10-01 NOTE — Telephone Encounter (Signed)
 Spoke with representative, judy out of office until wednesday, left VM

## 2023-10-04 NOTE — Telephone Encounter (Addendum)
 Marily Shows is calling back regarding pt she wanting to let you know that pt has GI disfunction and has 1 BM a month. She also feels like pt would be a good confidant for PT for pelvic Floor Eval. And pt also has POTS. Call back [phone]

## 2023-10-05 NOTE — Telephone Encounter (Signed)
 Placed orders for vestibular physical therapy and pelvic floor therapy

## 2023-10-12 ENCOUNTER — Telehealth: Admitting: Physician Assistant

## 2023-10-12 ENCOUNTER — Encounter (INDEPENDENT_AMBULATORY_CARE_PROVIDER_SITE_OTHER): Payer: Self-pay | Admitting: Physician Assistant

## 2023-10-12 MED ORDER — ONDANSETRON HCL 4 MG OR TABS
4.0000 mg | ORAL_TABLET | Freq: Three times a day (TID) | ORAL | 0 refills | Status: DC | PRN
Start: 2023-10-12 — End: 2024-02-06

## 2023-10-12 NOTE — Progress Notes (Signed)
 [facility] Video Visit    Interactive real time audio and visual communication used for the telehealth visit.   HPI   Christa Fasig is a 28 year old female who presents to clinic for referral.     Patient presents for multiple concerns:     Patient is in physical therapy for her fibromyalgia, her physical therapist had recommended physical therapy for 2 other conditions patient is dealing with:     #poor balance - therapist noted patient has poor balance and tested positive for fall risk, recommended vestibular physical therapy     #chronic constipation - patient hjas been dealing with constipation for months, sometimes reports only 1 bowel movement per month, she has followed with GI for this and they can't figure it out. Therapist also recommended some form of pelvic floor physical therapy.     Since increasing the dose of Wegovy , the patient has been experiencing increased nausea and vomiting. They request medication for nausea relief. The patient also reports ongoing depression, noting that Transcranial Magnetic Stimulation (TMS) therapy was ineffective. They are now considering ketamine therapy as an alternative treatment for depression.      REVIEW OF SYSTEMS    Negative except for those in HPI      PAST MEDICAL AND SURGICAL HISTORY:    has a past surgical history that includes Cardiac defibrillator placement (09/2017) and Gallbladder surgery (01/2023).  Past Medical History:   Diagnosis Date    Depression     Migraine     Spinal lordosis     Tachycardia     Hx Tachycardia, coronary artery spasms - sees cardiologist, on meds. Defibrillator placed 09/2017.        CURRENT MEDICATIONS:    Current Outpatient Medications:     albuterol  108 (90 Base) MCG/ACT inhaler, Inhale 1 puff by mouth every 6 hours as needed for Wheezing., Disp: 6.7 g, Rfl: 0    buPROPion  (WELLBUTRIN  XL) 300 MG XL tablet, Take 1 tablet (300 mg) by mouth every morning., Disp: 90 tablet, Rfl: 0    Cholecalciferol (VITAMIN D3)  125 MCG (5000 UT) tablet, Take 1 tablet (5,000 Units) by mouth daily., Disp: 30 tablet, Rfl: 3    docusate sodium  (COLACE) 250 MG capsule, TAKE 1 CAPSULE (250 MG) BY MOUTH 2 TIMES DAILY, Disp: 60 capsule, Rfl: 2    hydrOXYzine  HCL (ATARAX ) 10 MG tablet, Take 1 tablet (10 mg) by mouth every 8 hours as needed., Disp: , Rfl:     lasmiditan  (REYVOW ) 100 MG tablet, Take 1 tablet (100 mg) by mouth as needed for Migraine., Disp: 8 tablet, Rfl: 5    lithium  (ESKALITH ) 150 MG capsule, , Disp: , Rfl:     Magnesium  Oxide 500 MG CAPS, TAKE 1 CAPSULE BY MOUTH EVERY DAY, Disp: 30 capsule, Rfl: 11    methotrexate (TREXALL) 2.5 MG tablet, Take 1 tablet (2.5 mg) by mouth, Disp: , Rfl:     montelukast  (SINGULAIR ) 10 MG tablet, TAKE 1 TABLET BY MOUTH EVERY EVENING, Disp: 90 tablet, Rfl: 0    MOTEGRITY  2 MG TABS, TAKE 1 TABLET (2 MG) BY MOUTH DAILY, Disp: 30 each, Rfl: 2    Multiple Vitamin (ONE-A-DAY ESSENTIAL) TABS, Take 1 tablet by mouth daily., Disp: , Rfl:     nadolol  (CORGARD ) 40 MG tablet, TAKE 1 TABLET BY MOUTH EVERY DAY, Disp: 30 tablet, Rfl: 0    nitroGLYcerin  (NITROSTAT ) 0.4 MG SL tablet, 1 tablet (0.4 mg) by Sublingual route every 5 minutes as  needed for Chest Pain. up to 3 tabs per episode., Disp: 20 tablet, Rfl: 0    norethindrone  (ORTHO MICRONOR ) 0.35 MG tablet, Take 1 tablet by mouth daily., Disp: 364 tablet, Rfl: 0    omeprazole  (PRILOSEC) 20 MG capsule, Take 1 capsule (20 mg) by mouth every morning (before breakfast)., Disp: 90 capsule, Rfl: 0    ondansetron  (ZOFRAN ) 4 MG tablet, Take 1 tablet (4 mg) by mouth every 8 hours as needed for Nausea., Disp: 30 tablet, Rfl: 0    predniSONE (DELTASONE) 5 MG tablet, , Disp: , Rfl:     pregabalin (LYRICA) 50 MG capsule, , Disp: , Rfl:     rimegepant (NURTEC) tablet, Take 1 tablet as needed for migraine. No more than 1 tablet per 24 hrs, Disp: 8 tablet, Rfl: 5    Semaglutide -Weight Management 0.5 MG/0.5ML SOAJ, Inject 0.5 mg under the skin every 7 days for 28 days. Start after 4  weeks of 0.25 mg wegovy , Disp: 2 mL, Rfl: 0    venlafaxine  XR (EFFEXOR  XR) 150 MG capsule, Take 1 capsule (150 mg) by mouth daily., Disp: 90 capsule, Rfl: 0    venlafaxine  XR (EFFEXOR  XR) 75 MG capsule, Take 1 capsule (75 mg) by mouth daily., Disp: 90 capsule, Rfl: 0    verapamil  (CALAN  SR) 180 MG SR tablet, TAKE 1 TABLET BY MOUTH EVERY DAY, Disp: 30 tablet, Rfl: 0     ALLERGIES:  Gluten meal    FAMILY HISTORY:  family history includes Cancer in her m grandmother; Cholesterol/Lipid Disorder in her mother; Heart Disease in her mother.    SOCIAL HISTORY:    reports that she has never smoked. She has never used smokeless tobacco. She reports that she does not currently use alcohol. She reports that she does not use drugs.     PHYSICAL EXAMINATION    There were no vitals filed for this visit.  There is no height or weight on file to calculate BMI.  Wt Readings from Last 5 Encounters:   09/14/23 95.3 kg (210 lb)   08/30/23 95.3 kg (210 lb)   06/21/23 94.8 kg (209 lb)   04/04/23 99.8 kg (220 lb)   12/20/22 99.8 kg (220 lb)     Blood Pressure   07/23/22 (!) 116/53   10/10/21 108/71   06/09/21 90/62   03/25/18 100/68   05/08/14 121/67       GEN: in NAD, A&O, pleasant, non-toxic appearing  EENT: No injection or icterus.   Psych: Alert and Oriented. Affect appropriate.       ASSESSMENT AND PLAN    1. Nausea  Assessment: Patient experiencing increased nausea and vomiting since Wegovy  dose increase.  Plan:  - Prescribe Zofran  for nausea management  - ondansetron  (ZOFRAN ) 4 MG tablet; Take 1 tablet (4 mg) by mouth every 8 hours as needed for Nausea.  Dispense: 30 tablet; Refill: 0    2. Irritable bowel syndrome with constipation  Plan:  - Referral to GI doctor at [facility] for chronic constipation management  - Referral for pelvic floor physical therapy  - Recommend bulk-forming laxative (MiraLAX )  - Follow up with GI doctor for further evaluation and management    3. Poor balance  Plan:  - Referral for  vestibular physical therapy at [facility] approved on Oct 09, 2023      4. Severe episode of recurrent major depressive disorder, without psychotic features (CMS-HCC)  Currently active, continue to follow up  with psych.     Patient understands and is agreeable to the treatment plan.  All  questions have been answered thoroughly.      There are no Patient Instructions on file for this visit.    FOLLOW UP   Return in about 3 months (around 01/12/2024) for Follow Up.    Armida JAYSON Scull, PA-C

## 2023-11-02 ENCOUNTER — Other Ambulatory Visit (INDEPENDENT_AMBULATORY_CARE_PROVIDER_SITE_OTHER): Payer: Self-pay | Admitting: Physician Assistant

## 2023-11-02 DIAGNOSIS — J45909 Unspecified asthma, uncomplicated: Secondary | ICD-10-CM

## 2023-11-02 MED ORDER — MONTELUKAST SODIUM 10 MG OR TABS
10.0000 mg | ORAL_TABLET | Freq: Every evening | ORAL | 0 refills | Status: DC
Start: 2023-11-02 — End: 2024-02-04

## 2023-11-13 ENCOUNTER — Encounter (INDEPENDENT_AMBULATORY_CARE_PROVIDER_SITE_OTHER): Payer: Self-pay | Admitting: Physician Assistant

## 2023-11-20 ENCOUNTER — Telehealth: Admitting: Nurse Practitioner

## 2023-11-20 MED ORDER — FLUOCINOLONE ACETONIDE SCALP 0.01 % EX OIL
TOPICAL_OIL | CUTANEOUS | 3 refills | Status: DC
Start: 2023-11-20 — End: 2024-03-19

## 2023-11-20 NOTE — Progress Notes (Signed)
 [facility] Video Visit    Interactive real time audio and visual communication used for the telehealth visit and patient gave consent.  HPI   Kristen Olson is a 28 year old female  who presents on telehealth for Derm Problem (Scalp psoriasis-  has not used anything for a few years. Has tried ketoconazole shampoo in the past with little result)  She has tried several topicals but does not remember which ones right now. She also states she has RA, is taking methotrexate and prednisone. She would like to avoid topicals if able but is having a scalp flare today. It has not spread to her hair line but she wants to avoid the flare getting that bad again. She feels the topicals only provide temporary relief but she cannot recall if she used them long enough or as directed on the prescription.     PHQ:       No data to display                 REVIEW OF SYSTEMS    Review of Systems  Negative except for those in HPI      PAST MEDICAL AND SURGICAL HISTORY:    has a past surgical history that includes Cardiac defibrillator placement (09/2017) and Gallbladder surgery (01/2023).  Patient Active Problem List   Diagnosis    Surveillance for birth control, oral contraceptives    Gastroesophageal reflux disease, unspecified whether esophagitis present    Irritable bowel syndrome with constipation    Lower abdominal pain    Calcium  channel blocker overdose, intentional self-harm, initial encounter (CMS-HCC)    ICD (implantable cardioverter-defibrillator) in place    Migraines    Asthma    Depression        CURRENT MEDICATIONS:    Current Outpatient Medications:     albuterol  108 (90 Base) MCG/ACT inhaler, Inhale 1 puff by mouth every 6 hours as needed for Wheezing., Disp: 6.7 g, Rfl: 0    buPROPion  (WELLBUTRIN  XL) 300 MG XL tablet, Take 1 tablet (300 mg) by mouth every morning., Disp: 90 tablet, Rfl: 0    Cholecalciferol (VITAMIN D3) 125 MCG (5000 UT) tablet, Take 1 tablet (5,000 Units) by mouth daily., Disp: 30  tablet, Rfl: 3    docusate sodium  (COLACE) 250 MG capsule, TAKE 1 CAPSULE (250 MG) BY MOUTH 2 TIMES DAILY, Disp: 60 capsule, Rfl: 2    fluocinolon (DERMA-SMOOTHE /FS) 0.01 % external oil, Apply a thin layer to affected area on scalp nightly then rinse in the morning. Do this every night for 2 to 3 weeks then use 2 nights per week as needed for for maintenance., Disp: 118 mL, Rfl: 3    hydrOXYzine  HCL (ATARAX ) 10 MG tablet, Take 1 tablet (10 mg) by mouth every 8 hours as needed., Disp: , Rfl:     lasmiditan  (REYVOW ) 100 MG tablet, Take 1 tablet (100 mg) by mouth as needed for Migraine. (Patient not taking: Reported on 11/20/2023), Disp: 8 tablet, Rfl: 5    lithium  (ESKALITH ) 150 MG capsule, , Disp: , Rfl:     Magnesium  Oxide 500 MG CAPS, TAKE 1 CAPSULE BY MOUTH EVERY DAY, Disp: 30 capsule, Rfl: 11    methotrexate (TREXALL) 2.5 MG tablet, Take 1 tablet (2.5 mg) by mouth, Disp: , Rfl:     montelukast  (SINGULAIR ) 10 MG tablet, Take 1 tablet (10 mg) by mouth every evening., Disp: 90 tablet, Rfl: 0    MOTEGRITY  2 MG TABS, TAKE 1 TABLET (2  MG) BY MOUTH DAILY (Patient not taking: Reported on 11/20/2023), Disp: 30 each, Rfl: 2    Multiple Vitamin (ONE-A-DAY ESSENTIAL) TABS, Take 1 tablet by mouth daily., Disp: , Rfl:     nadolol  (CORGARD ) 40 MG tablet, TAKE 1 TABLET BY MOUTH EVERY DAY, Disp: 30 tablet, Rfl: 0    nitroGLYcerin  (NITROSTAT ) 0.4 MG SL tablet, 1 tablet (0.4 mg) by Sublingual route every 5 minutes as needed for Chest Pain. up to 3 tabs per episode., Disp: 20 tablet, Rfl: 0    norethindrone  (ORTHO MICRONOR ) 0.35 MG tablet, Take 1 tablet by mouth daily., Disp: 364 tablet, Rfl: 0    omeprazole  (PRILOSEC) 20 MG capsule, Take 1 capsule (20 mg) by mouth every morning (before breakfast)., Disp: 90 capsule, Rfl: 0    ondansetron  (ZOFRAN ) 4 MG tablet, Take 1 tablet (4 mg) by mouth every 8 hours as needed for Nausea., Disp: 30 tablet, Rfl: 0    predniSONE (DELTASONE) 5 MG tablet, , Disp: , Rfl:     pregabalin (LYRICA) 50 MG  capsule, , Disp: , Rfl:     rimegepant (NURTEC) tablet, Take 1 tablet as needed for migraine. No more than 1 tablet per 24 hrs (Patient not taking: Reported on 11/20/2023), Disp: 8 tablet, Rfl: 5    venlafaxine  XR (EFFEXOR  XR) 150 MG capsule, Take 1 capsule (150 mg) by mouth daily., Disp: 90 capsule, Rfl: 0    venlafaxine  XR (EFFEXOR  XR) 75 MG capsule, Take 1 capsule (75 mg) by mouth daily., Disp: 90 capsule, Rfl: 0    verapamil  (CALAN  SR) 180 MG SR tablet, TAKE 1 TABLET BY MOUTH EVERY DAY, Disp: 30 tablet, Rfl: 0     ALLERGIES:  Gluten meal    FAMILY HISTORY:  family history includes Cancer in her m grandmother; Cholesterol/Lipid Disorder in her mother; Heart Disease in her mother.    SOCIAL HISTORY:     none     Socioeconomic History    Marital status: Married   Tobacco Use    Smoking status: Never    Smokeless tobacco: Never   Substance and Sexual Activity    Alcohol use: Not Currently    Drug use: Never    Sexual activity: Yes     Partners: Male     Birth control/protection: Pill        PHYSICAL EXAMINATION    There were no vitals filed for this visit.  There is no height or weight on file to calculate BMI.  Wt Readings from Last 5 Encounters:   09/14/23 95.3 kg (210 lb)   08/30/23 95.3 kg (210 lb)   06/21/23 94.8 kg (209 lb)   04/04/23 99.8 kg (220 lb)   12/20/22 99.8 kg (220 lb)     Blood Pressure   07/23/22 (!) 116/53   10/10/21 108/71   06/09/21 90/62   03/25/18 100/68   05/08/14 121/67     Physical Exam  GEN: in NAD, A&O, pleasant, non-toxic appearing  EENT: No injection or icterus.   Psych: Alert and Oriented. Affect appropriate.  Derm: scaly plaques on the frontal scalp, unable to fully visualize due to poor imaging on telehealth.    ASSESSMENT AND PLAN    1. Psoriasis vulgaris  Chronic and flaring. Patient would like to pursue biologic therapy. Due to her hx of RA, methotrexate and prednisone patient plans to see MD dermatologist at outside clinic. Discussed etiology and treatment, including risks and  benefits, related to condition. Patient understands and elects to start  topical therapy for now until she can see an MD. Discussed the importance of using the topical as directed. Consider Vtama Cream while waiting for MD referral.       Patient Instructions     I have placed some links to reading material about common causes and flare factors for psoriasis:    [email or web address]    [email or web address]              I have placed a referral for an MD dermatologist to start you on biologic therapy if indicated. We will message you the name and phone number for the referral via mychart once it is submitted. If you aren't signed up for mychart we will send you a secure text message. You may then call the specialist and schedule the appointment.     If you have an HMO, your medical group will mail you a letter once they approve the referral, which can take up to 3 weeks. Please note--some specialists may require approval from your HMO before allowing you to schedule.     If you haven't received any information on your referral 2 weeks after your visit or if you are having difficulties with the referral, please give the referrals team a call directly at [phone].      FOLLOW UP   Return for psoriasis follow up.    Lamarr DELENA Gondola, NP

## 2023-11-20 NOTE — Addendum Note (Signed)
 Addended by: Klyn Kroening on: 11/20/2023 04:18 PM     Modules accepted: Orders

## 2023-11-20 NOTE — Patient Instructions (Addendum)
 I have placed some links to reading material about common causes and flare factors for psoriasis:    [email or web address]    [email or web address]              I have placed a referral for an MD dermatologist to start you on biologic therapy if indicated. We will message you the name and phone number for the referral via mychart once it is submitted. If you aren't signed up for mychart we will send you a secure text message. You may then call the specialist and schedule the appointment.     If you have an HMO, your medical group will mail you a letter once they approve the referral, which can take up to 3 weeks. Please note--some specialists may require approval from your HMO before allowing you to schedule.     If you haven't received any information on your referral 2 weeks after your visit or if you are having difficulties with the referral, please give the referrals team a call directly at [phone].

## 2023-11-21 ENCOUNTER — Telehealth: Admitting: Registered Nurse

## 2023-11-21 MED ORDER — SUZETRIGINE 50 MG PO TABS
ORAL_TABLET | ORAL | 0 refills | Status: DC
Start: 2023-11-21 — End: 2024-01-15

## 2023-11-21 NOTE — Progress Notes (Signed)
 [facility] Video Visit    Interactive real time audio and visual communication used for the telehealth visit and patient gave consent.  HPI   Kristen Olson is a 28 year old female with RA, fibromyalgia and chronic migraines who presents to clinic for pain management consult.     Pt has fibromyalgia and currently in PT. Hx of POTs and RA. She has a cardiac defibrillator in place. She is on Lyrica 50mg  qid. She takes Methotrexate for her RA and prednisone. Migraines started when she got COVID 2023. She has had nerve blocks which did not help. She also sees neuro but not very helpful. They did not recommend Botox due to bad reaction to nerve block. Pt has tried and failed Emgality , Ajovy , Reyvow , Nurtec, CCBs, Beta Blockers, Triptans. Vyepti . She has all over body pain.     PHQ:       No data to display                 REVIEW OF SYSTEMS    Review of Systems  Negative except for those in HPI      PAST MEDICAL AND SURGICAL HISTORY:    has a past surgical history that includes Cardiac defibrillator placement (09/2017) and Gallbladder surgery (01/2023).  Patient Active Problem List   Diagnosis    Surveillance for birth control, oral contraceptives    Gastroesophageal reflux disease, unspecified whether esophagitis present    Irritable bowel syndrome with constipation    Lower abdominal pain    Calcium  channel blocker overdose, intentional self-harm, initial encounter (CMS-HCC)    ICD (implantable cardioverter-defibrillator) in place    Migraines    Asthma    Depression        CURRENT MEDICATIONS:    Current Outpatient Medications:     albuterol  108 (90 Base) MCG/ACT inhaler, Inhale 1 puff by mouth every 6 hours as needed for Wheezing., Disp: 6.7 g, Rfl: 0    buPROPion  (WELLBUTRIN  XL) 300 MG XL tablet, Take 1 tablet (300 mg) by mouth every morning., Disp: 90 tablet, Rfl: 0    Cholecalciferol (VITAMIN D3) 125 MCG (5000 UT) tablet, Take 1 tablet (5,000 Units) by mouth daily., Disp: 30 tablet, Rfl: 3     docusate sodium  (COLACE) 250 MG capsule, TAKE 1 CAPSULE (250 MG) BY MOUTH 2 TIMES DAILY, Disp: 60 capsule, Rfl: 2    fluocinolon (DERMA-SMOOTHE /FS) 0.01 % external oil, Apply a thin layer to affected area on scalp nightly then rinse in the morning. Do this every night for 2 to 3 weeks then use 2 nights per week as needed for for maintenance., Disp: 118 mL, Rfl: 3    hydrOXYzine  HCL (ATARAX ) 10 MG tablet, Take 1 tablet (10 mg) by mouth every 8 hours as needed., Disp: , Rfl:     Magnesium  Oxide 500 MG CAPS, TAKE 1 CAPSULE BY MOUTH EVERY DAY, Disp: 30 capsule, Rfl: 11    methotrexate (TREXALL) 2.5 MG tablet, Take 1 tablet (2.5 mg) by mouth, Disp: , Rfl:     montelukast  (SINGULAIR ) 10 MG tablet, Take 1 tablet (10 mg) by mouth every evening., Disp: 90 tablet, Rfl: 0    Multiple Vitamin (ONE-A-DAY ESSENTIAL) TABS, Take 1 tablet by mouth daily., Disp: , Rfl:     nitroGLYcerin  (NITROSTAT ) 0.4 MG SL tablet, 1 tablet (0.4 mg) by Sublingual route every 5 minutes as needed for Chest Pain. up to 3 tabs per episode., Disp: 20 tablet, Rfl: 0    norethindrone  (ORTHO MICRONOR )  0.35 MG tablet, Take 1 tablet by mouth daily., Disp: 364 tablet, Rfl: 0    ondansetron  (ZOFRAN ) 4 MG tablet, Take 1 tablet (4 mg) by mouth every 8 hours as needed for Nausea., Disp: 30 tablet, Rfl: 0    predniSONE (DELTASONE) 5 MG tablet, , Disp: , Rfl:     pregabalin (LYRICA) 50 MG capsule, , Disp: , Rfl:     suzetrigine  (JOURNAVX ) 50 MG tablet, 1 tab po bid on day 1, 1 tab po qday thereafter, Disp: 25 tablet, Rfl: 0    venlafaxine  XR (EFFEXOR  XR) 150 MG capsule, Take 1 capsule (150 mg) by mouth daily., Disp: 90 capsule, Rfl: 0    venlafaxine  XR (EFFEXOR  XR) 75 MG capsule, Take 1 capsule (75 mg) by mouth daily., Disp: 90 capsule, Rfl: 0     ALLERGIES:  Gluten meal    FAMILY HISTORY:  family history includes Cancer in her m grandmother; Cholesterol/Lipid Disorder in her mother; Heart Disease in her mother.    SOCIAL HISTORY:     none     Socioeconomic History     Marital status: Married   Tobacco Use    Smoking status: Never    Smokeless tobacco: Never   Substance and Sexual Activity    Alcohol use: Not Currently    Drug use: Never    Sexual activity: Yes     Partners: Male     Birth control/protection: Pill        PHYSICAL EXAMINATION    There were no vitals filed for this visit.  There is no height or weight on file to calculate BMI.  Wt Readings from Last 5 Encounters:   09/14/23 95.3 kg (210 lb)   08/30/23 95.3 kg (210 lb)   06/21/23 94.8 kg (209 lb)   04/04/23 99.8 kg (220 lb)   12/20/22 99.8 kg (220 lb)     Blood Pressure   07/23/22 (!) 116/53   10/10/21 108/71   06/09/21 90/62   03/25/18 100/68   05/08/14 121/67     Physical Exam  GEN: in NAD, A&O, pleasant, non-toxic appearing  EENT: No injection or icterus.   Psych: Alert and Oriented. Affect appropriate.       ASSESSMENT AND PLAN    1. Rheumatoid arthritis involving multiple sites with positive rheumatoid factor (CMS-HCC)  Continue with Rheumatologist   - Refer to Physical Therapy-Outside  - suzetrigine  (JOURNAVX ) 50 MG tablet; 1 tab po bid on day 1, 1 tab po qday thereafter  Dispense: 25 tablet; Refill: 0    2. Chronic migraine with aura and with status migrainosus, not intractable  Pt to inquire about Botox through neuro   - suzetrigine  (JOURNAVX ) 50 MG tablet; 1 tab po bid on day 1, 1 tab po qday thereafter  Dispense: 25 tablet; Refill: 0    3. Fibromyalgia  Continue Lyrica 50mg  qid   - Refer to Physical Therapy-Outside    4. POTS (postural orthostatic tachycardia syndrome)  Continue with cardio       Patient Instructions   Nice meeting you today! I am here Mondays-Thursdays. You can message me directly through MyChart as well. Otherwise we are open every day including Sundays if you need something urgently.   -Jashua Knaak FNP-BC, MSN, RN       FOLLOW UP   Return in about 2 weeks (around 12/05/2023), or if symptoms worsen or fail to improve.    Kristen Toribio Primrose, NP

## 2023-11-21 NOTE — Patient Instructions (Signed)
 Nice meeting you today! I am here Mondays-Thursdays. You can message me directly through MyChart as well. Otherwise we are open every day including Sundays if you need something urgently.   -Phallon Haydu FNP-BC, MSN, RN

## 2023-11-22 ENCOUNTER — Encounter (INDEPENDENT_AMBULATORY_CARE_PROVIDER_SITE_OTHER): Payer: Self-pay

## 2023-11-22 ENCOUNTER — Telehealth

## 2023-11-22 NOTE — Progress Notes (Signed)
 [facility] Endocrinology Note  Interactive real time audio and visual communication used for the telehealth visit and patient gave consent.    HPI   Kristen Olson is a 28 year old female of Hispanic descent who presents to clinic for Weight Problem  . 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 PCP is [name]    Patient is self-referred for hormone evaluation in the setting of weight gain.    #Weight  Patient has been experiencing issues with weight for the past 3 years.  Patient felt that she could gain 20lbs in a month despite not doing anything differently.  She has gone to several different providers and says they have not had any explanation for her weight.  She has had a hard time losing weight despite changes in exercise or diet.  She reports PCP was previously concerned for Cushing's and referred to see an endo.  Patient was seen by a [facility] endocrinologist about 2-3 years ago whom after reviewing her case felt that it was not related to Cushing's and recommended weight loss.  Per patient, no blood work or testing was done at the time.  Started on Wegovy  0.25mg  at [facility] (see note from 07/23/2023 with pharmD Curtistine Burows) with and then switched to [facility].  Currently seeing NP [name] at [facility] Weight management, last visit was April 2025.  Patient reports she was taking Metformin  at some point but stopped in favor of Wegovy .  Patient is now on Wegovy  0.5mg  and reports obtaining through [facility] pharmacy.  Feels over the past few months she has been gaining weight despite being on Wegovy .  She says that a higher dosage has not been offered due to concerns for GI upset and feeling sick.  She reports weight tends to fluctuate between 210-220lbs.  Was taking Metformin  but they stopped it, by Weight management, in favor or  Wegovy   Patient is taking Wegovy  0.5mg  through [facility] Weight management, but had to be sent through [facility], been on Wegovy  for a few months now, gaining weight, didn't want to increase due to feeling sick since she currently experiences nausea and sometimes vomiting while on therapy.  She is not as hungry anymore but feels that weight loss has not been very successful.  Believes she may have lost 10lbs from feeling sick.    Metformin  - stopped after starting wegovy   Phentermine - CI due to cardiac disease. Pt has ICD in place for management ventricular arrhythmia   Qsymia - CI due to cardiac disease   Wellbutrin  - already using medication, ineffective for weight loss  Topiramate used for migraines in 2014 for 1 year, caused side effects so it was discontinued     #Menses  Menarche at age [age]. Had a painful ovarian cyst at age 45 prompting hospital visit. Patient was suggested to start OCP at the hospital.  Patient has been on and off OCPs since the age of 33. She reports sometimes she'll have two periods in a month, or no periods for several months.   There are periods of time when she does not take it for months, sometimes will stop because of insurance, or will stop because she is waiting for script to be filled.  She has now been consistently on OCP for the past year.  Takes the placebo week but has not had a period over the past year.    #Rheumatoid  Arthritis  Patient is being followed by rheumatologist Dr. Tristan Coffer for rheumatoid arthritis who is prescribing her daily prednisone started 4-5 mos ago.  She is currently on a taper down regimen for the prednisone with plans to stop in about a month.    #Hx of ICD and ventricular tachycardia  Patient is being followed by cardiologist for history of ventricular fibrillation/tachycardia with history of cardiac arrest s/p ICD and CPVT with microvascular anginal spasms.  Mother with hx of heart disease.  Followed by Dr. Curtistine Hoe who is aware she is  on GLP-1.  Per patient he prefers it due to cardiac side effects of other weight loss drugs.    Also known to pain management physical therapist Dagoberto Martins.  PCP Armida Scull. Patient reports she has not done a physical exam in awhile.    REVIEW OF SYSTEMS    Review of Systems  Negative except for those in HPI      PAST MEDICAL AND SURGICAL HISTORY:    has a past surgical history that includes Cardiac defibrillator placement (09/2017) and Gallbladder surgery (01/2023).  Patient Active Problem List   Diagnosis    Surveillance for birth control, oral contraceptives    Gastroesophageal reflux disease, unspecified whether esophagitis present    Irritable bowel syndrome with constipation    Lower abdominal pain    Calcium  channel blocker overdose, intentional self-harm, initial encounter (CMS-HCC)    ICD (implantable cardioverter-defibrillator) in place    Migraines    Asthma    Depression       CURRENT MEDICATIONS:    Current Outpatient Medications:     albuterol  108 (90 Base) MCG/ACT inhaler, Inhale 1 puff by mouth every 6 hours as needed for Wheezing., Disp: 6.7 g, Rfl: 0    buPROPion  (WELLBUTRIN  XL) 300 MG XL tablet, Take 1 tablet (300 mg) by mouth every morning., Disp: 90 tablet, Rfl: 0    Cholecalciferol (VITAMIN D3) 125 MCG (5000 UT) tablet, Take 1 tablet (5,000 Units) by mouth daily., Disp: 30 tablet, Rfl: 3    docusate sodium  (COLACE) 250 MG capsule, TAKE 1 CAPSULE (250 MG) BY MOUTH 2 TIMES DAILY, Disp: 60 capsule, Rfl: 2    fluocinolon (DERMA-SMOOTHE /FS) 0.01 % external oil, Apply a thin layer to affected area on scalp nightly then rinse in the morning. Do this every night for 2 to 3 weeks then use 2 nights per week as needed for for maintenance., Disp: 118 mL, Rfl: 3    hydrOXYzine  HCL (ATARAX ) 10 MG tablet, Take 1 tablet (10 mg) by mouth every 8 hours as needed., Disp: , Rfl:     Magnesium  Oxide 500 MG CAPS, TAKE 1 CAPSULE BY MOUTH EVERY DAY, Disp: 30 capsule, Rfl: 11    methotrexate (TREXALL) 2.5 MG  tablet, Take 1 tablet (2.5 mg) by mouth, Disp: , Rfl:     montelukast  (SINGULAIR ) 10 MG tablet, Take 1 tablet (10 mg) by mouth every evening., Disp: 90 tablet, Rfl: 0    Multiple Vitamin (ONE-A-DAY ESSENTIAL) TABS, Take 1 tablet by mouth daily., Disp: , Rfl:     nitroGLYcerin  (NITROSTAT ) 0.4 MG SL tablet, 1 tablet (0.4 mg) by Sublingual route every 5 minutes as needed for Chest Pain. up to 3 tabs per episode., Disp: 20 tablet, Rfl: 0    norethindrone  (ORTHO MICRONOR ) 0.35 MG tablet, Take 1 tablet by mouth daily., Disp: 364 tablet, Rfl: 0    ondansetron  (ZOFRAN ) 4 MG tablet, Take 1 tablet (4 mg) by mouth every  8 hours as needed for Nausea., Disp: 30 tablet, Rfl: 0    predniSONE (DELTASONE) 5 MG tablet, , Disp: , Rfl:     pregabalin (LYRICA) 50 MG capsule, , Disp: , Rfl:     suzetrigine  (JOURNAVX ) 50 MG tablet, 1 tab po bid on day 1, 1 tab po qday thereafter, Disp: 25 tablet, Rfl: 0    venlafaxine  XR (EFFEXOR  XR) 150 MG capsule, Take 1 capsule (150 mg) by mouth daily., Disp: 90 capsule, Rfl: 0    venlafaxine  XR (EFFEXOR  XR) 75 MG capsule, Take 1 capsule (75 mg) by mouth daily., Disp: 90 capsule, Rfl: 0     ALLERGIES:  Gluten meal    FAMILY HISTORY:  family history includes Cancer in her m grandmother; Cholesterol/Lipid Disorder in her mother; Heart Disease in her mother.    SOCIAL HISTORY:     none     Socioeconomic History    Marital status: Married   Occupational History    Occupation: Engineer, building services, previously Pension scheme manager Rep   Tobacco Use    Smoking status: Never    Smokeless tobacco: Never   Substance and Sexual Activity    Alcohol use: Not Currently    Drug use: Never    Sexual activity: Yes     Partners: Male     Birth control/protection: Pill        PHYSICAL EXAMINATION    There were no vitals filed for this visit.  There is no height or weight on file to calculate BMI.  Wt Readings from Last 5 Encounters:   09/14/23 95.3 kg (210 lb)   08/30/23 95.3 kg (210 lb)   06/21/23 94.8 kg (209 lb)   04/04/23 99.8  kg (220 lb)   12/20/22 99.8 kg (220 lb)     Blood Pressure   07/23/22 (!) 116/53   10/10/21 108/71   06/09/21 90/62   03/25/18 100/68   05/08/14 121/67     Physical Exam  GEN: in NAD, A&O, pleasant, non-toxic appearing  EENT: No injection or icterus.   Psych: Alert and Oriented. Affect appropriate.       ASSESSMENT AND PLAN    Other specified endocrine disorders  -     Glycosylated Hgb(A1C), Blood  -     CBC w/ Diff  -     CMP  -     Prolactin, Blood Green Plasma Separator Tube  -     Testosterone , Free and Total  -     FSH and LH  -     Estradiol , Blood Green Plasma Separator Tube  -     Progesterone  Yellow serum separator tube  -     TSH, Blood  -     Urine HCG  -     Urinalysis with Culture Reflex, when indicated  -     Insulin , Random, Blood  -     Anti-Thyroid  Peroxidase AB (Anti-TPO) Yellow serum separator tube  28 yo female presenting today for hormone evaluation in the setting of weight gain. 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  Extensive discussion regarding labs and history.  05/2023 TSH 2.270  07/2022  TSH 1.14  06/2022 BG 103H, LFTS WNL  Denies renal disorders. Hx of fatty liver.  Hx of prediabetes.  Currently on prednisone which commonly leads to weight gain.  Patient denies any known changes in jaw size, hat size.  No hx of  TBI.  Patient asked to obtain repeat blood work.  Discussed with patient that her current usage of prednisone will impact cortisol testing as she is interested in Cushing's evaluation.   Would need to be off prednisone for at least a week before testing.  Patient will be following taper per prednisone.  Patient to fast for labs.  Return in 2 weeks for further discussion.  Next appointment with cardiologist is on 02/20/2024.    Prediabetes  08/03/2022 A1C 5.8%  Recommend repeat.  Patient now on Wegovy .  Eat slowly, small portions, avoid over  eating.  Follow-up weight management team due to persisting symptoms.    Weight gain  Likely exacerbated by prednisone usage.  Patient aware that cortisol testing will be impacted by current prednisone administration and therefore would recommend holding this testing until course completed.    Irregular menstrual cycle  -     Prolactin, Blood Green Plasma Separator Tube  -     Testosterone , Free and Total  -     FSH and LH  -     Estradiol , Blood Green Plasma Separator Tube  -     Progesterone  Yellow serum separator tube  -     Urine HCG  -     Urinalysis with Culture Reflex, when indicated  Patient on ortho-micronor  OCP.  Patient aware female hormone testing will be influenced by OCP and thus of poor clinical utility.  Patient understands and okay with moving forward.    Vitamin D  deficiency  -     Vitamin D , 25-OH Total Yellow serum separator tube  Screen.    Obesity (BMI 30-39.9)  -     Glycosylated Hgb(A1C), Blood  -     CBC w/ Diff  -     CMP  -     TSH, Blood  -     Urine HCG  -     Insulin , Random, Blood  -     Anti-Thyroid  Peroxidase AB (Anti-TPO) Yellow serum separator tube  Patient would benefit potentially from switching to Zepbound  due to persistent GI side effects of Wegovy .  Patient to return to [facility] Weight management for further discussion.    Rheumatoid arthritis, involving unspecified site, unspecified whether rheumatoid factor present (CMS-HCC)  Prednisone will worsen weight gain.  No high blood pressure.  No moon facies, buffalo hump, violaceous striae, skin atrophy, proximal muscle weakness.  Discussed with patient that oral administration of prednisone may potentially cause Cushing's.  Patient is titrating downwards at present.    Patient Instructions   Come into one of our offices or go to [facility] to get your blood drawn. Please fast 10 hours (water is okay). Please avoid biotin supplementation for at least 3 days prior to blood draw. You may schedule an appointment with us  at  [email or web address] following the prompts to a medical assistant visit or by using this link     [email or web address]    Alternatively, you may schedule with [facility] at [email or web address]            Patient to go to [facility] with any significant symptoms.         60 minutes spent on chart review, seeing the patient, and charting     FOLLOW UP   Return in about 2 weeks (around 12/06/2023) for follow up, lab review.    Annabella Shoe, [place]

## 2023-11-24 NOTE — Patient Instructions (Signed)
Come into one of our offices or go to [facility] to get your blood drawn. Please fast 10 hours (water is okay). Please avoid biotin supplementation for at least 3 days prior to blood draw. You may schedule an appointment with us at [email or web address] following the prompts to a medical assistant visit or by using this link     [email or web address]    Alternatively, you may schedule with [facility] at [email or web address].     Patient to go to [facility] with any significant symptoms.       60 minutes spent on chart review, seeing the patient, and charting

## 2023-11-28 ENCOUNTER — Encounter (INDEPENDENT_AMBULATORY_CARE_PROVIDER_SITE_OTHER): Payer: Self-pay | Admitting: Registered Nurse

## 2023-11-28 ENCOUNTER — Encounter (INDEPENDENT_AMBULATORY_CARE_PROVIDER_SITE_OTHER): Payer: Self-pay

## 2023-11-28 DIAGNOSIS — N926 Irregular menstruation, unspecified: Secondary | ICD-10-CM

## 2023-11-28 DIAGNOSIS — R7303 Prediabetes: Secondary | ICD-10-CM

## 2023-11-28 DIAGNOSIS — R635 Abnormal weight gain: Secondary | ICD-10-CM

## 2023-11-28 DIAGNOSIS — E348 Other specified endocrine disorders: Secondary | ICD-10-CM

## 2023-11-28 DIAGNOSIS — E559 Vitamin D deficiency, unspecified: Secondary | ICD-10-CM

## 2023-11-28 DIAGNOSIS — E669 Obesity, unspecified: Secondary | ICD-10-CM

## 2023-11-28 NOTE — Telephone Encounter (Signed)
 [facility] Medication Prior Authorization     Previous encounter created for same issue?  No    Who is calling?: Other    What medication needs Prior Auth: Journavx     Is this a new medication? Yes (do not create new encounter - use open encounter)    Has patient tried and failed any alternative mediations? Yes (do not create new encounter - use open encounter)     If yes, please state which medications and patients reaction. Pt has fibromyalgia and currently in PT. Hx of POTs and RA. She has a cardiac defibrillator in place. She is on Lyrica 50mg  qid. She takes Methotrexate for her RA and prednisone. Migraines started when she got COVID 2023. She has had nerve blocks which did not help. She also sees neuro but not very helpful. They did not recommend Botox due to bad reaction to nerve block. Pt has tried and failed Emgality , Ajovy , Reyvow , Nurtec, CCBs, Beta Blockers, Triptans. Vyepti . She has all over body pain.     (Information needed below is found on patients insurance card or by calling pharmacy)  Member Insurance Name: [facility] LAR[employer] COMMERCIA/[id number]  Member Medication ID #: [id number]  BIN #: [id number]  PCN #: [id number]  Rx Group #: [id number]    Is patient requesting an urgent request?: No (Patient has enough medication to last the 72 business hours turnaround time)    PLEASE ROUTE MESSAGE TO [facility] PRIOR AUTH POOL    Kristen Olson

## 2023-11-28 NOTE — Telephone Encounter (Signed)
 Can you do a PA for Journavx 

## 2023-11-29 NOTE — Telephone Encounter (Signed)
 This SmartPhrase is intended for use of Prior Authorization department.     PA request received: Telephone encounter    Medication: suzetrigine  (JOURNAVX ) 50 MG tablet    Prescribed by: Genevie Vena Sieving, NP    Method of Submission:  EPA    Key number or Case Number: [id number]    Type of Review: Standard review (up to 7 day turn around time)    PA Status: PA PENDING

## 2023-12-03 ENCOUNTER — Encounter (INDEPENDENT_AMBULATORY_CARE_PROVIDER_SITE_OTHER): Payer: Self-pay | Admitting: Hospital

## 2023-12-03 NOTE — Telephone Encounter (Signed)
 This SmartPhrase is intended for use of Prior Authorization department.     PA request received: Telephone encounter    Medication: suzetrigine  (JOURNAVX ) 50 MG tablet    Prescribed by: Genevie Vena Sieving, NP    Method of Submission:  EPA    Key number or Case Number: [id number]    Type of Review: Standard review (up to 7 day turn around time)    PA Status: PA HAS BEEN APPROVED  Effective Date: 11/29/2023  Authorization Expiration Date: 11/28/2024  Pt has been notified via MyChart, and the approval letter has been uploaded to media.    Thank you!

## 2023-12-05 ENCOUNTER — Ambulatory Visit (INDEPENDENT_AMBULATORY_CARE_PROVIDER_SITE_OTHER): Payer: Self-pay

## 2023-12-06 ENCOUNTER — Other Ambulatory Visit (INDEPENDENT_AMBULATORY_CARE_PROVIDER_SITE_OTHER)

## 2023-12-06 ENCOUNTER — Telehealth (INDEPENDENT_AMBULATORY_CARE_PROVIDER_SITE_OTHER): Payer: Self-pay

## 2023-12-06 NOTE — Telephone Encounter (Signed)
 [facility] Encounter Note    Previous Telephone/MyChart encounter created for same issue?  No    Please describe the action taken by yourself in detail: [name] / [name] stated that he is calling to confirm that the MR request was received stated that he sent it to our regular fax number on yesterday. Informed that we will not get it that fast    This is just documentation in the patient chart, no further action is required.    My phone extension, if further information is needed: [phone]    Kristen Olson

## 2023-12-06 NOTE — Interdisciplinary (Signed)
 PROCEDURE(S) PERFORMED DURING MA VISIT    Procedure(s):   -VENIPUNCTURE: Attempts - 2, Needle - 23g, Route - left and right arm, Successful draw? no, Tubes - 3, Sent to [facility]  Performed by Medical Assistant: Erminio Mad    Ordered and verified by Ronell Riggs PA  Additional Notes:   -VENIPUNCTURE: Specimen label verified by Patient, confirmed correct full name and date of birth      Most Recent Immunizations   Administered Date(s) Administered    COVID-19 (Moderna) Red Cap >= 12 Years 10/04/2019    COVID-19 (PFIZER) Seasonal Vaccine, >=12 years 08/02/2022     Kristen Olson had no medications administered during this visit.

## 2023-12-06 NOTE — Interdisciplinary (Signed)
 PROCEDURE(S) PERFORMED DURING MA VISIT    Procedure(s):   -VENIPUNCTURE: Attempts - 1, Needle - 21G, Route - Left Arm , Successful draw? Yes , Tubes - 2 Tigers and 1 Lav, Sent to [facility]  Performed by Medical Assistant: Ozell Dad    Ordered and verified by Ronell Riggs, PA  Additional Notes:   -VENIPUNCTURE: Specimen label verified by Patient, confirmed correct full name and date of birth      Most Recent Immunizations   Administered Date(s) Administered    COVID-19 (Moderna) Red Cap >= 12 Years 10/04/2019    COVID-19 (PFIZER) Seasonal Vaccine, >=12 years 08/02/2022     Kristen Olson had no medications administered during this visit.

## 2023-12-07 NOTE — Telephone Encounter (Signed)
 [facility] Encounter Note    Previous Telephone/MyChart encounter created for same issue?  No    Please describe the action taken by yourself in detail: Faxed document--[employer] is calling to see MRR was received. Rep was needing email, provided infor and STAT as well.     This is just documentation in the patient chart, no further action is required.    My phone extension, if further information is needed: [phone]    [place]

## 2023-12-10 ENCOUNTER — Telehealth (INDEPENDENT_AMBULATORY_CARE_PROVIDER_SITE_OTHER): Payer: Self-pay | Admitting: Internal Medicine

## 2023-12-10 ENCOUNTER — Telehealth (INDEPENDENT_AMBULATORY_CARE_PROVIDER_SITE_OTHER): Payer: Self-pay | Admitting: Medical

## 2023-12-10 LAB — URINALYSIS WITH CULTURE REFLEX, WHEN INDICATED
Bilirubin: NEGATIVE
Blood: NEGATIVE
Glucose: NEGATIVE
Ketones: NEGATIVE
Leuk Esterase: NEGATIVE
Nitrite: NEGATIVE
Protein: NEGATIVE
Specific Gravity: <1.005 — ABNORMAL LOW (ref 1.005–1.030)
Urobilinogen,Semi-Qn: 0.2 mg/dL (ref 0.2–1.0)
pH: 6.5 (ref 5.0–7.5)

## 2023-12-10 LAB — CBC WITH DIFF, BLOOD
Baso (Absolute): 0 x10E3/uL (ref 0.0–0.2)
Basos: 0 %
Eos (Absolute): 0.3 x10E3/uL (ref 0.0–0.4)
Eos: 4 %
Hematocrit: 40.9 % (ref 34.0–46.6)
Hemoglobin: 13.8 g/dL (ref 11.1–15.9)
Immature Grans (Abs): 0 x10E3/uL (ref 0.0–0.1)
Immature Granulocytes: 0 %
Lymphs (Absolute): 3.2 x10E3/uL — ABNORMAL HIGH (ref 0.7–3.1)
Lymphs: 37 %
MCH: 31.9 pg (ref 26.6–33.0)
MCHC: 33.7 g/dL (ref 31.5–35.7)
MCV: 95 fL (ref 79–97)
Monocytes(Absolute): 0.5 x10E3/uL (ref 0.1–0.9)
Monocytes: 6 %
Neutrophils (Absolute): 4.5 x10E3/uL (ref 1.4–7.0)
Neutrophils: 53 %
Platelets: 273 x10E3/uL (ref 150–450)
RBC: 4.32 x10E6/uL (ref 3.77–5.28)
RDW: 13.7 % (ref 11.7–15.4)
WBC: 8.6 x10E3/uL (ref 3.4–10.8)

## 2023-12-10 LAB — URINALYSIS MICROSCOPIC ONLY, RANDOM URINE
Bacteria: NONE SEEN
Casts: NONE SEEN /LPF
Epithelial Cells (non renal): NONE SEEN /HPF (ref 0–10)
RBC: NONE SEEN /HPF (ref 0–2)
WBC: NONE SEEN /HPF (ref 0–5)

## 2023-12-10 LAB — COMPREHENSIVE METABOLIC PANEL, BLOOD
ALT (SGPT): 57 IU/L — ABNORMAL HIGH (ref 0–32)
AST: 35 IU/L (ref 0–40)
Albumin: 4.8 g/dL (ref 4.0–5.0)
Alkaline Phos: 119 IU/L (ref 44–121)
BUN/Creatinine Ratio: 14 (ref 9–23)
BUN: 8 mg/dL (ref 6–20)
Bilirubin, Total: 0.2 mg/dL (ref 0.0–1.2)
Calcium: 9.8 mg/dL (ref 8.7–10.2)
Carbon Dioxide: 20 mmol/L (ref 20–29)
Chloride: 99 mmol/L (ref 96–106)
Creatinine: 0.58 mg/dL (ref 0.57–1.00)
EGFR: 126 mL/min/1.73 (ref >59)
Globulin, Total: 2.6 g/dL (ref 1.5–4.5)
Glucose: 91 mg/dL (ref 70–99)
Potassium: 4.1 mmol/L (ref 3.5–5.2)
Protein, Total, Serum: 7.4 g/dL (ref 6.0–8.5)
Sodium: 138 mmol/L (ref 134–144)

## 2023-12-10 LAB — ESTRADIOL, BLOOD: Estradiol: 37.1 pg/mL

## 2023-12-10 LAB — VITAMIN D, 25-OH TOTAL: Vitamin D, 25-Hydroxy: 48.8 ng/mL (ref 30.0–100.0)

## 2023-12-10 LAB — FSH AND LH-CONSOLIDATED
FSH: 6.2 m[IU]/mL
LH: 5.5 m[IU]/mL

## 2023-12-10 LAB — PROLACTIN, BLOOD: Prolactin: 9.5 ng/mL (ref 4.8–33.4)

## 2023-12-10 LAB — TESTOSTERONE, FREE AND TOTAL-CONSOLIDATED
Testosterone, Free: 1.5 pg/mL (ref 0.0–4.2)
Testosterone, Serum: 28 ng/dL (ref 13–71)

## 2023-12-10 LAB — INSULIN, RANDOM, BLOOD: Insulin: 78.6 u[IU]/mL — ABNORMAL HIGH (ref 2.6–24.9)

## 2023-12-10 LAB — ANTI-THYROID PEROXIDASE AB, BLOOD: Thyroid Peroxidase (TPO) Ab: 28 [IU]/mL (ref 0–34)

## 2023-12-10 LAB — TSH, BLOOD: TSH: 2.48 u[IU]/mL (ref 0.450–4.500)

## 2023-12-10 LAB — PROGESTERONE: Progesterone: <0.1 ng/mL

## 2023-12-10 LAB — GLYCOSYLATED HGB(A1C), BLOOD: Hemoglobin A1c: 5.6 % (ref 4.8–5.6)

## 2023-12-10 LAB — URINE HCG: Urine HCG-Pregnancy: NEGATIVE

## 2023-12-10 NOTE — Telephone Encounter (Signed)
MR faxed to [facility]

## 2023-12-10 NOTE — Telephone Encounter (Signed)
 Mr faxed to 1 [phone]

## 2023-12-10 NOTE — Telephone Encounter (Signed)
 12/10/23  Received Records request from [employer]. Placed in [employer].  RE

## 2023-12-11 ENCOUNTER — Telehealth (INDEPENDENT_AMBULATORY_CARE_PROVIDER_SITE_OTHER): Payer: Self-pay | Admitting: Medical

## 2023-12-11 ENCOUNTER — Telehealth (INDEPENDENT_AMBULATORY_CARE_PROVIDER_SITE_OTHER): Payer: Self-pay | Admitting: Neurology

## 2023-12-11 NOTE — Telephone Encounter (Signed)
 Received Record Request from [facility], placed in [employer] inbox

## 2023-12-11 NOTE — Telephone Encounter (Signed)
 12/11/23  Received Status of Records request from [employer]. Placed in [employer].  RE

## 2023-12-12 ENCOUNTER — Telehealth

## 2023-12-12 ENCOUNTER — Encounter (INDEPENDENT_AMBULATORY_CARE_PROVIDER_SITE_OTHER): Payer: Self-pay

## 2023-12-12 NOTE — Progress Notes (Signed)
 [facility] Endocrinology Note  Interactive real time audio and visual communication used for the telehealth visit and patient gave consent.    HPI   CONSULT: Kristen Olson is a 28 year old female of Hispanic descent who presents to clinic for Results  . 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 PCP is [name]    Patient is self-referred for hormone evaluation in the setting of weight gain.    #Weight  Patient has been experiencing issues with weight for the past 3 years.  Patient felt that she could gain 20lbs in a month despite not doing anything differently.  She has gone to several different providers and says they have not had any explanation for her weight.  She has had a hard time losing weight despite changes in exercise or diet.  She reports PCP was previously concerned for Cushing's and referred to see an endo.  Patient was seen by a [facility] endocrinologist about 2-3 years ago whom after reviewing her case felt that it was not related to Cushing's and recommended weight loss.  Per patient, no blood work or testing was done at the time.  Started on Wegovy  0.25mg  at [facility] (see note from 07/23/2023 with pharmD Curtistine Burows) with and then switched to [facility].  Currently seeing NP [name] at [facility], last visit was April 2025.  Patient reports she was taking Metformin  at some point but stopped in favor of Wegovy .  Patient is now on Wegovy  0.5mg  and reports obtaining through [facility].  Feels over the past few months she has been gaining weight despite being on Wegovy .  She says that a higher dosage has not been offered due to concerns for GI upset and feeling sick.  She reports weight tends to fluctuate between 210-220lbs.  Was taking Metformin  but they stopped it, by Weight management, in favor or  Wegovy   Patient is taking Wegovy  0.5mg  through [facility], but had to be sent through [facility], been on Wegovy  for a few months now, gaining weight, didn't want to increase due to feeling sick since she currently experiences nausea and sometimes vomiting while on therapy.  She is not as hungry anymore but feels that weight loss has not been very successful.  Believes she may have lost 10lbs from feeling sick.    Metformin  - stopped after starting wegovy   Phentermine - CI due to cardiac disease. Pt has ICD in place for management ventricular arrhythmia   Qsymia - CI due to cardiac disease   Wellbutrin  - already using medication, ineffective for weight loss  Topiramate used for migraines in 2014 for 1 year, caused side effects so it was discontinued     #Menses  Menarche at age [age]. Had a painful ovarian cyst at age 10 prompting hospital visit. Patient was suggested to start OCP at the hospital.  Patient has been on and off OCPs since the age of 38. She reports sometimes she'll have two periods in a month, or no periods for several months.   There are periods of time when she does not take it for months, sometimes will stop because of insurance, or will stop because she is waiting for script to be filled.  She has now been consistently on OCP for the past year.  Takes the placebo week but has not had a period over the past year.    #Rheumatoid  Arthritis  Patient is being followed by rheumatologist Dr. Tristan Coffer for rheumatoid arthritis who is prescribing her daily prednisone started 4-5 mos ago.  She is currently on a taper down regimen for the prednisone with plans to stop in about a month.    #Hx of ICD and ventricular tachycardia  Patient is being followed by cardiologist for history of ventricular fibrillation/tachycardia with history of cardiac arrest s/p ICD and CPVT with microvascular anginal spasms.  Mother with hx of heart disease.  Followed by Dr. Curtistine Hoe who is aware she is  on GLP-1.  Per patient he prefers it due to cardiac side effects of other weight loss drugs.    Also known to pain management physical therapist Dagoberto Martins.  PCP Armida Scull. Patient reports she has not done a physical exam in awhile.    REVIEW OF SYSTEMS    Review of Systems  Negative except for those in HPI      PAST MEDICAL AND SURGICAL HISTORY:    has a past surgical history that includes Cardiac defibrillator placement (09/2017) and Gallbladder surgery (01/2023).  Patient Active Problem List   Diagnosis    Surveillance for birth control, oral contraceptives    Gastroesophageal reflux disease, unspecified whether esophagitis present    Irritable bowel syndrome with constipation    Lower abdominal pain    Calcium  channel blocker overdose, intentional self-harm, initial encounter (CMS-HCC)    ICD (implantable cardioverter-defibrillator) in place    Migraines    Asthma    Depression       CURRENT MEDICATIONS:    Current Outpatient Medications:     albuterol  108 (90 Base) MCG/ACT inhaler, Inhale 1 puff by mouth every 6 hours as needed for Wheezing., Disp: 6.7 g, Rfl: 0    buPROPion  (WELLBUTRIN  XL) 300 MG XL tablet, Take 1 tablet (300 mg) by mouth every morning., Disp: 90 tablet, Rfl: 0    Cholecalciferol (VITAMIN D3) 125 MCG (5000 UT) tablet, Take 1 tablet (5,000 Units) by mouth daily., Disp: 30 tablet, Rfl: 3    docusate sodium  (COLACE) 250 MG capsule, TAKE 1 CAPSULE (250 MG) BY MOUTH 2 TIMES DAILY, Disp: 60 capsule, Rfl: 2    fluocinolon (DERMA-SMOOTHE /FS) 0.01 % external oil, Apply a thin layer to affected area on scalp nightly then rinse in the morning. Do this every night for 2 to 3 weeks then use 2 nights per week as needed for for maintenance., Disp: 118 mL, Rfl: 3    hydrOXYzine  HCL (ATARAX ) 10 MG tablet, Take 1 tablet (10 mg) by mouth every 8 hours as needed., Disp: , Rfl:     Magnesium  Oxide 500 MG CAPS, TAKE 1 CAPSULE BY MOUTH EVERY DAY, Disp: 30 capsule, Rfl: 11    methotrexate (TREXALL) 2.5 MG  tablet, Take 1 tablet (2.5 mg) by mouth, Disp: , Rfl:     montelukast  (SINGULAIR ) 10 MG tablet, Take 1 tablet (10 mg) by mouth every evening., Disp: 90 tablet, Rfl: 0    Multiple Vitamin (ONE-A-DAY ESSENTIAL) TABS, Take 1 tablet by mouth daily., Disp: , Rfl:     nitroGLYcerin  (NITROSTAT ) 0.4 MG SL tablet, 1 tablet (0.4 mg) by Sublingual route every 5 minutes as needed for Chest Pain. up to 3 tabs per episode., Disp: 20 tablet, Rfl: 0    norethindrone  (ORTHO MICRONOR ) 0.35 MG tablet, Take 1 tablet by mouth daily., Disp: 364 tablet, Rfl: 0    ondansetron  (ZOFRAN ) 4 MG tablet, Take 1 tablet (4 mg) by mouth every  8 hours as needed for Nausea., Disp: 30 tablet, Rfl: 0    predniSONE (DELTASONE) 5 MG tablet, , Disp: , Rfl:     pregabalin (LYRICA) 50 MG capsule, , Disp: , Rfl:     suzetrigine  (JOURNAVX ) 50 MG tablet, 1 tab po bid on day 1, 1 tab po qday thereafter, Disp: 25 tablet, Rfl: 0    venlafaxine  XR (EFFEXOR  XR) 150 MG capsule, Take 1 capsule (150 mg) by mouth daily., Disp: 90 capsule, Rfl: 0    venlafaxine  XR (EFFEXOR  XR) 75 MG capsule, Take 1 capsule (75 mg) by mouth daily., Disp: 90 capsule, Rfl: 0     ALLERGIES:  Gluten meal    FAMILY HISTORY:  family history includes Cancer in her m grandmother; Cholesterol/Lipid Disorder in her mother; Heart Disease in her mother.    SOCIAL HISTORY:     none     Socioeconomic History    Marital status: Married   Occupational History    Occupation: Engineer, building services, previously Pension scheme manager Rep   Tobacco Use    Smoking status: Never    Smokeless tobacco: Never   Substance and Sexual Activity    Alcohol use: Not Currently    Drug use: Never    Sexual activity: Yes     Partners: Male     Birth control/protection: Pill        PHYSICAL EXAMINATION    There were no vitals filed for this visit.  There is no height or weight on file to calculate BMI.  Wt Readings from Last 5 Encounters:   09/14/23 95.3 kg (210 lb)   08/30/23 95.3 kg (210 lb)   06/21/23 94.8 kg (209 lb)   04/04/23 99.8  kg (220 lb)   12/20/22 99.8 kg (220 lb)     Blood Pressure   07/23/22 (!) 116/53   10/10/21 108/71   06/09/21 90/62   03/25/18 100/68   05/08/14 121/67     Physical Exam  GEN: in NAD, A&O, pleasant, non-toxic appearing  EENT: No injection or icterus.   Psych: Alert and Oriented. Affect appropriate.       ASSESSMENT AND PLAN    Other specified endocrine disorders  28 yo female presenting today for hormone evaluation in the setting of weight gain. 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  Extensive discussion regarding labs and history.  05/2023 TSH 2.270  07/2022  TSH 1.14  06/2022 BG 103H, LFTS WNL  Denies renal disorders. Hx of fatty liver.  Hx of prediabetes.  Patient denies any known changes in jaw size, hat size.  No hx of TBI.  S/p consult  12/06/2023 [facility] 6.2/LH 5.5, estradiol  37.1, prog <0.1, PRL 9.5  Total testosterone  28, Uhpt neg  11/2023 TSH 2.480, neg TPO 28 (<34), previous TPO neg in 2012  Patient reports stopping OCP two weeks prior to this blood test.  Prednisone stopped 3 weeks prior.  Discussed significance of findings which may still be impacted by OCP.  PRL normal reassuring.  Patient interested in cortisol evaluation though could not be done prior due to prednisone usage.  Patient to repeat hormones after at least 6-8 weeks of OCP discontinuation.  Patient to then message us  and we will place order for dex suppression test and Rx. Discussed testing protocol.  All questions answered.  Next appointment with cardiologist is on 02/20/2024.    Prediabetes  08/03/2022 A1C 5.8%  11/2023 A1C  5.6%, FBG 91, fasting insulin  78.6H  Improved after using Wegovy  but patient has stopped Wegovy  d/t side effects. Reports no weight loss on Wegovy .  Discussed insulin  resistance which may worsen with prednisone.  Patient will be seeing weight management in 1 week for discussion of  Zepbound .    Elevated Liver Enzymes  12/06/2023 ALT 57H  Patient reports fatty liver recently seen on 06/2023 ultrasound of RUQ by PCP.  Discussed weight loss, f/u with weight management team and PCP for further management.    Weight gain  Likely exacerbated by prednisone usage though patient has discontinued now.  Patient to perform cortisol testing as she is very interested in further evaluation of this though must wait at least 6-8 weeks after stopping OCP.    Patient aware that cortisol testing will be impacted by current prednisone administration and therefore would recommend holding this testing until course completed.    Irregular menstrual cycle  Patient no longer on ortho-micronor  OCP.  Patient aware female hormone testing will be influenced by OCP since she only stopped two weeks ago.  Patient to repeat testing in 6-8 weeks after discontinuance.    Vitamin D  deficiency  11/2023 Vit D WNL    Obesity (BMI 30-39.9)  Patient would benefit potentially from switching to Zepbound  due to persistent GI side effects of Wegovy .  Patient to return to [facility] for further discussion.    Rheumatoid arthritis, involving unspecified site, unspecified whether rheumatoid factor present (CMS-HCC)  Prednisone will worsen weight gain. She has stopped several weeks ago.  No high blood pressure.  No moon facies, buffalo hump, violaceous striae, skin atrophy, proximal muscle weakness.  Continue f/u with rheum.    Patient Instructions   Come into one of our offices or go to [facility] to get your blood drawn. Please fast 10 hours (water is okay). Please avoid biotin supplementation for at least 3 days prior to blood draw. You may schedule an appointment with us  at [email or web address] following the prompts to a medical assistant visit or by using this link     [email or web address]    Alternatively, you may  schedule with [facility] at [email or web address]            SALIVARY CORTISOL COLLECTION: You will need to pick up the salivary testing kits from [facility]. Please collect your saliva on 3 separate nights, as close to midnight as possible. Make sure to label each sample with the date and time collected. Store the samples in the refrigerator after collecting it and you can turn them all in at the same time to the lab. You should not eat or drink any food other than water 30 minutes before each swab.           Patient to go to [facility] with any significant symptoms.                40 minutes spent on chart review, seeing the patient, and charting     FOLLOW UP   Return in about 6 weeks (around 01/23/2024) for follow up, lab review.    Annabella Shoe, [place]

## 2023-12-12 NOTE — Patient Instructions (Addendum)
 Come into one of our offices or go to [facility] to get your blood drawn. Please fast 10 hours (water is okay). Please avoid biotin supplementation for at least 3 days prior to blood draw. You may schedule an appointment with us  at [email or web address] following the prompts to a medical assistant visit or by using this link     [email or web address]    Alternatively, you may schedule with [facility] at [email or web address]            SALIVARY CORTISOL COLLECTION: You will need to pick up the salivary testing kits from [facility]. Please collect your saliva on 3 separate nights, as close to midnight as possible. Make sure to label each sample with the date and time collected. Store the samples in the refrigerator after collecting it and you can turn them all in at the same time to the lab. You should not eat or drink any food other than water 30 minutes before each swab.           Patient to go to [facility] with any significant symptoms.                40 minutes spent on chart review, seeing the patient, and charting

## 2023-12-17 ENCOUNTER — Telehealth (INDEPENDENT_AMBULATORY_CARE_PROVIDER_SITE_OTHER): Payer: Self-pay | Admitting: Medical

## 2023-12-17 NOTE — Telephone Encounter (Signed)
 12/17/23  Received Status of Records from [facility]. Placed in [employer].  RE

## 2023-12-21 ENCOUNTER — Telehealth

## 2023-12-21 MED ORDER — TIRZEPATIDE-WEIGHT MANAGEMENT 2.5 MG/0.5ML SC SOAJ
2.5000 mg | SUBCUTANEOUS | 0 refills | Status: DC
Start: 2023-12-21 — End: 2024-01-17

## 2023-12-21 NOTE — Patient Instructions (Signed)
 What is the most important information I should know about semaglutide?  Call your doctor at once if you have signs of a thyroid tumor, such as swelling or a lump in your neck, trouble swallowing, a hoarse voice, or shortness of breath.  What is semaglutide?  Semaglutide (Rybelsus and Ozempic) is used with diet and exercise to improve blood sugar control in adults with type 2 diabetes mellitus (not for type 1 diabetes).  Ozempic is also used to help reduce the risk of serious heart problems such as heart attack or stroke in adults who have type 2 diabetes and heart disease.  Reginal Lutes is used with diet and exercise in people 12 years and older to manage weight in overweight people who also have at least one weight-related medical condition such as type 2 diabetes, high blood pressure, or high cholesterol.  Semaglutide may also be used for purposes not listed in this medication guide.  What should I discuss with my healthcare provider before using semaglutide?  You should not use semaglutide if you are allergic to it, or if you have:  multiple endocrine neoplasia type 2 (tumors in your glands); or  a personal or family history of medullary thyroid carcinoma (a type of thyroid cancer).  Tell your doctor if you have ever had:  a stomach or intestinal disorder;  gallbladder disease;  pancreatitis;  eye problems caused by diabetes (retinopathy); or  kidney disease.  Stop using this medicine at least 2 months before you plan to get pregnant. Ask your doctor for a safer medicine to use during this time. Controlling diabetes is very important during pregnancy, as is gaining the right amount of weight. Even if you are overweight, losing weight during pregnancy could harm the unborn baby.  If you are pregnant, your name may be listed on a pregnancy registry to track the effects of Wegovy on the baby.  Ask a doctor if it is safe to breastfeed while using Ozempic or Wegovy.  You should not breastfeed while using Rybelsus.  How  should I use semaglutide?  Follow all directions on your prescription label and read all medication guides or instruction sheets. Use the medicine exactly as directed. Ask your doctor or pharmacist if you need help. Semaglutide is usually started at a low dose that is gradually increased every 4 weeks to 30 days.  Rybelsus (oral) is taken by mouth, usually once per day when you first wake up and at least 30 minutes before you eat or drink anything. Take with no more than 4 ounces of water. Swallow the tablet whole and do not crush, chew, or break it.  Eat within 30 to 60 minutes after taking Rybelsus.  Ozempic and Wegovy are injected under the skin, usually once per week at any time of the day, with or without food. Use an injection on the same day each week.  Prepare an injection only when you are ready to give it. Call your pharmacist if the medicine looks cloudy, has changed colors, or has particles in it.  Your healthcare provider will show you where to inject semaglutide. Do not inject into the same place two times in a row.  If you choose a different weekly injection day, start your new schedule after at least 2 days have passed since the last injection you gave.  Do not use different brands of semaglutide at the same time.  Blood sugar can be affected by stress, illness, surgery, exercise, alcohol use, or skipping meals.  Low blood  sugar (hypoglycemia) can make you feel very hungry, dizzy, irritable, or shaky. To quickly treat hypoglycemia, eat or drink hard candy, crackers, raisins, fruit juice, or non-diet soda. Your doctor may prescribe glucagon injection in case of severe hypoglycemia.  Tell your doctor if you have frequent symptoms of high blood sugar (hyperglycemia) such as increased thirst or urination. Ask your doctor before changing your dose or medication schedule.  Your treatment may also include diet, exercise, weight control, medical tests, and special medical care.  You may get dehydrated during  prolonged illness. Call your doctor if you are sick with vomiting or diarrhea, or if you eat or drink less than usual.  Store Rybelsus in the original package at room temperature, away from moisture and heat.  Store unopened [employer] or [employer] injection pens in the original carton in a refrigerator, protected from light. Do not use past the expiration date. Throw away an injection pen that has been frozen.  If needed, you may store an unopened Wegovy pen at cool room temperature for up to 28 days. Do not remove the cap until you are ready to use the injection pen. The pen contains a single dose. Throw the pen away after one use, even if there is still medicine left inside.  The Ozempic injection pen contains more than one dose. After your first use, store the pen with the needle removed in a refrigerator or at room temperature. Protect from heat and light. Keep the cap on when not in use. Throw the pen away 56 days after the first use, or if less than 0.25 mg is shown on the dose counter.  Do not reuse a needle. Place it in a puncture-proof "sharps" container and dispose of it following state or local laws. Keep out of the reach of children and pets.  What happens if I miss a dose?  For Rybelsus: Skip the missed dose and use your next dose at the regular time.  For Ozempic: Use the medicine as soon as you can and then go back to your regular schedule. If you are more than 5 days late for the injection, skip the missed dose and return to your regular schedule.  For ZOXWRU: Use the medicine as soon as you can and then go back to your regular schedule. If your next dose is due in less than 2 days (48 hours), skip the missed dose and return to your regular schedule.  Do not use two doses of semaglutide at one time.  Call your doctor if you miss more than 2 doses in a row of Wegovy. You may need to restart the medicine at a lower dose to avoid stomach problems.  What happens if I overdose?  Seek emergency medical  attention or call the Poison Help line at (365)526-0456.  Overdose may cause severe nausea, vomiting, or low blood sugar.  What should I avoid while using semaglutide?  Never share an injection pen, even if you changed the needle. Sharing this device can pass infection or disease from person to person.  What are the possible side effects of semaglutide?  Get emergency medical help if you have signs of an allergic reaction: hives, itching; dizziness, fast heartbeats; difficult breathing; swelling of your face, lips, tongue, or throat.  Call your doctor at once if you have:  vision changes;  unusual mood changes, thoughts about hurting yourself;  pounding heartbeats or fluttering in your chest;  a light-headed feeling, like you might pass out;  signs of a  thyroid tumor--swelling or a lump in your neck, trouble swallowing, a hoarse voice, feeling short of breath;  symptoms of pancreatitis--severe pain in your upper stomach spreading to your back, nausea with or without vomiting, fast heart rate;  gallbladder problems--upper stomach pain, fever, clay-colored stools, jaundice (yellowing of the skin or eyes);  low blood sugar--headache, hunger, weakness, sweating, confusion, irritability, dizziness, fast heart rate, or feeling jittery;  kidney problems--swelling, urinating less, feeling tired or short of breath; or  stomach flu symptoms--stomach cramps, vomiting, loss of appetite, diarrhea (may be watery or bloody).  Common side effects may include:  low blood sugar (in people with type 2 diabetes);  upset stomach, heartburn, burping, gas, bloating;  nausea, vomiting, stomach pain, loss of appetite;  diarrhea, constipation;  runny nose or sore throat;  stomach flu symptoms; or  headache, dizziness, tiredness.  This is not a complete list of side effects and others may occur. Call your doctor for medical advice about side effects. You may report side effects to FDA at 1-800-FDA-1088.  What other drugs will affect  semaglutide?  Semaglutide can slow your digestion, and it may take longer for your body to absorb any medicines you take by mouth.  Tell your doctor about all your other medicines, especially insulin or other diabetes medicine, such as dulaglutide, exenatide, liraglutide, Byetta, Trulicity, Victoza, and others.  Other drugs may affect semaglutide, including prescription and over-the-counter medicines, vitamins, and herbal products. Tell your doctor about all other medicines you use.  Where can I get more information?  Your doctor or pharmacist can provide more information about semaglutide.

## 2023-12-21 NOTE — Progress Notes (Signed)
 [facility] Video Visit    Note: I am providing obesity and metabolic medicine management for this patient.  I am not assuming primary care and the patient is aware they should continue to follow up with their PCP for all general medical concerns and health care maintenance.        Interactive real time audio and video communication used for the telehealth visit and patient gave consent.     HPI   Kristen Olson is a 28 year old female with history of Obesity, Prediabetes, ICD,  MDD, GAD, PTSD, past suicide attempt who presents to clinic for Weight Management follow up. Patient has a goal of achieving a normal BMI [18.5-24.9] to improve quality of life and reduce obesity-related complications.     Current BMI 40.62 kg/m- Morbid Obesity    Current weight loss medication: Wegovy  0.5mg   She reports dizziness, vomiting, nausea moderate to severe that did not subside. She stopped taking Wegovy  2 weeks ago and does not want to continue the medication.  . She did not lose weight on the medication and is requesting an alternative, such as Zepbound . No other complaints at this time. She is feeling better .         REVIEW OF SYSTEMS    Review of Systems  Negative except for those in HPI    Denies History of gallstones or pancreatitis  Denies Hypersensitivity to drug or ingredient  Denies Personal or family history of medullary thyroid  cancer  Denies Multiple endocrine neoplasia syndrome type 2  Denies Suicidal ideation or history of suicidal ideation/attempt  Denies pregnancy, breastfeeding or planning to become pregnant        PAST MEDICAL AND SURGICAL HISTORY:    has a past surgical history that includes Cardiac defibrillator placement (09/2017) and Gallbladder surgery (01/2023).  Patient Active Problem List   Diagnosis    Surveillance for birth control, oral contraceptives    Gastroesophageal reflux disease, unspecified whether esophagitis present    Irritable bowel syndrome with constipation    Lower  abdominal pain    Calcium  channel blocker overdose, intentional self-harm, initial encounter (CMS-HCC)    ICD (implantable cardioverter-defibrillator) in place    Migraines    Asthma    Depression        CURRENT MEDICATIONS:    Current Outpatient Medications:     albuterol  108 (90 Base) MCG/ACT inhaler, Inhale 1 puff by mouth every 6 hours as needed for Wheezing., Disp: 6.7 g, Rfl: 0    buPROPion  (WELLBUTRIN  XL) 300 MG XL tablet, Take 1 tablet (300 mg) by mouth every morning., Disp: 90 tablet, Rfl: 0    Cholecalciferol (VITAMIN D3) 125 MCG (5000 UT) tablet, Take 1 tablet (5,000 Units) by mouth daily., Disp: 30 tablet, Rfl: 3    docusate sodium  (COLACE) 250 MG capsule, TAKE 1 CAPSULE (250 MG) BY MOUTH 2 TIMES DAILY, Disp: 60 capsule, Rfl: 2    fluocinolon (DERMA-SMOOTHE /FS) 0.01 % external oil, Apply a thin layer to affected area on scalp nightly then rinse in the morning. Do this every night for 2 to 3 weeks then use 2 nights per week as needed for for maintenance., Disp: 118 mL, Rfl: 3    hydrOXYzine  HCL (ATARAX ) 10 MG tablet, Take 1 tablet (10 mg) by mouth every 8 hours as needed., Disp: , Rfl:     Magnesium  Oxide 500 MG CAPS, TAKE 1 CAPSULE BY MOUTH EVERY DAY, Disp: 30 capsule, Rfl: 11    methotrexate (TREXALL) 2.5 MG  tablet, Take 1 tablet (2.5 mg) by mouth, Disp: , Rfl:     montelukast  (SINGULAIR ) 10 MG tablet, Take 1 tablet (10 mg) by mouth every evening., Disp: 90 tablet, Rfl: 0    Multiple Vitamin (ONE-A-DAY ESSENTIAL) TABS, Take 1 tablet by mouth daily., Disp: , Rfl:     nitroGLYcerin  (NITROSTAT ) 0.4 MG SL tablet, 1 tablet (0.4 mg) by Sublingual route every 5 minutes as needed for Chest Pain. up to 3 tabs per episode., Disp: 20 tablet, Rfl: 0    ondansetron  (ZOFRAN ) 4 MG tablet, Take 1 tablet (4 mg) by mouth every 8 hours as needed for Nausea., Disp: 30 tablet, Rfl: 0    predniSONE (DELTASONE) 5 MG tablet, , Disp: , Rfl:     pregabalin (LYRICA) 50 MG capsule, , Disp: , Rfl:     suzetrigine  (JOURNAVX ) 50 MG  tablet, 1 tab po bid on day 1, 1 tab po qday thereafter, Disp: 25 tablet, Rfl: 0    tirzepatide -weight management (ZEPBOUND ) 2.5 MG/0.5ML injection pen, Inject 0.5 mL (2.5 mg) under the skin every 7 days., Disp: 2 mL, Rfl: 0    venlafaxine  XR (EFFEXOR  XR) 150 MG capsule, Take 1 capsule (150 mg) by mouth daily., Disp: 90 capsule, Rfl: 0    venlafaxine  XR (EFFEXOR  XR) 75 MG capsule, Take 1 capsule (75 mg) by mouth daily., Disp: 90 capsule, Rfl: 0     ALLERGIES:  Gluten meal    FAMILY HISTORY:  family history includes Cancer in her m grandmother; Cholesterol/Lipid Disorder in her mother; Heart Disease in her mother.    SOCIAL HISTORY:     none     Socioeconomic History    Marital status: Married   Occupational History    Occupation: Engineer, building services, previously Pension scheme manager Rep   Tobacco Use    Smoking status: Never    Smokeless tobacco: Never   Substance and Sexual Activity    Alcohol use: Not Currently    Drug use: Never    Sexual activity: Yes     Partners: Male     Birth control/protection: Pill        PHYSICAL EXAMINATION    There were no vitals filed for this visit.  Body mass index is 40.62 kg/m.  Wt Readings from Last 5 Encounters:   12/21/23 97.5 kg (215 lb)   09/14/23 95.3 kg (210 lb)   08/30/23 95.3 kg (210 lb)   06/21/23 94.8 kg (209 lb)   04/04/23 99.8 kg (220 lb)     Blood Pressure   07/23/22 (!) 116/53   10/10/21 108/71   06/09/21 90/62   03/25/18 100/68   05/08/14 121/67     Physical Exam  PHYSICAL EXAMINATION [exam limited due to virtual assessment]:  Physical Exam  General: Respiratory rate WNL, no acute distress, sitting comfortably  Head: Normocephalic, atraumatic  Eyes: EOMI, no eye injection, no eyelid swelling, no icterus  EENT: Mucous membranes moist, no visible lip cracking, no active nasal drainage  Neck: Supple, no visible goiter  Respiratory: No retractions, no nasal flaring, normal overall work of breathing  Cardio: No cyanosis  Skin: No visible rash or lesions, hair normal appearance    Psych: Normal affect, mood, and speech  Neuro: CN II-XII grossly intact    ASSESSMENT AND PLAN    1. Morbid obesity (CMS-HCC)    - tirzepatide -weight management (ZEPBOUND ) 2.5 MG/0.5ML injection pen; Inject 0.5 mL (2.5 mg) under the skin every 7 days.  Dispense: 2 mL; Refill: 0  Medications: Options, side effects, and contraindications discussed in detail with patient.  911 protocol reviewed with patient      Priority: Encouraged patient to feel confident in who they are and to prioritize goals of health in order to improve quality of life and decrease obesity-related disease, rather than achieving a scale number or look.  Consider mental and emotional health throughout this process and always report any concerns.       Goal: Discussed current BMI and goal BMI between 18.5 and 24.9 and achieving 5-10% loss of current body weight.      Food intake: Reviewed the concept of daily output exceeding daily input.  Healthy food intake discussed in detail, including caloric intake goal between TBD, (based on [place] St Jeor equation and current activity level) calories per day for achieving weight loss (DO NOT recommend <400 kcal/day).  Encouraged VEGETABLES and healthy proteins, minimizing sugars and carbohydrates.  Plenty of fluids.  Avoid sugary drinks.  DAILY FOOD JOURNAL (MyFitnessPal is a Garment/textile technologist for determining calories).         Exercise: Encouraged regular exercise, starting slow and increasing over time.  Goal of 30+ minutes 5-7 days per week, including both aerobic and resistance exercise.      Behavior Modification: Recommended daily food journal (can use PocketDoc as a tracking resource, app e.g. MyFitnessPal, Calorie Counter - MyNetDiary, or notebook), self-monitoring with self-weighing daily or twice weekly.  Encouraged long-term modification of food intake, physical activity, and behavior.  Counseling and self-help groups have proven to be helpful in this process.       Follow-up: Weekly for  the first month, twice monthly during months 2-3, monthly thereafter until goals are achieved, and then as needed.  Recommended letting Primary Care Provider know patient is in Weight Management Program and about any new prescriptions prescribed     Resources: PocketDoc has tools including weight, blood pressure, food, fluids logs as well as appointment availability with our Nutritionist and Personal Trainer Valerie Hagermann.  Using these resources is highly recommended as an adjunct to the plan of care determined with your clinician.      Patient Instructions   What is the most important information I should know about semaglutide ?  Call your doctor at once if you have signs of a thyroid  tumor, such as swelling or a lump in your neck, trouble swallowing, a hoarse voice, or shortness of breath.  What is semaglutide ?  Semaglutide  (Rybelsus  and Ozempic ) is used with diet and exercise to improve blood sugar control in adults with type 2 diabetes mellitus (not for type 1 diabetes).  Ozempic  is also used to help reduce the risk of serious heart problems such as heart attack or stroke in adults who have type 2 diabetes and heart disease.  Wegovy  is used with diet and exercise in people 12 years and older to manage weight in overweight people who also have at least one weight-related medical condition such as type 2 diabetes, high blood pressure, or high cholesterol.  Semaglutide  may also be used for purposes not listed in this medication guide.  What should I discuss with my healthcare provider before using semaglutide ?  You should not use semaglutide  if you are allergic to it, or if you have:  multiple endocrine neoplasia type 2 (tumors in your glands); or  a personal or family history of medullary thyroid  carcinoma (a type of thyroid  cancer).  Tell your doctor if you have ever had:  a stomach or intestinal disorder;  gallbladder disease;  pancreatitis;  eye problems caused by diabetes (retinopathy); or  kidney  disease.  Stop using this medicine at least 2 months before you plan to get pregnant. Ask your doctor for a safer medicine to use during this time. Controlling diabetes is very important during pregnancy, as is gaining the right amount of weight. Even if you are overweight, losing weight during pregnancy could harm the unborn baby.  If you are pregnant, your name may be listed on a pregnancy registry to track the effects of Wegovy  on the baby.  Ask a doctor if it is safe to breastfeed while using Ozempic  or Wegovy .  You should not breastfeed while using Rybelsus .  How should I use semaglutide ?  Follow all directions on your prescription label and read all medication guides or instruction sheets. Use the medicine exactly as directed. Ask your doctor or pharmacist if you need help. Semaglutide  is usually started at a low dose that is gradually increased every 4 weeks to 30 days.  Rybelsus  (oral) is taken by mouth, usually once per day when you first wake up and at least 30 minutes before you eat or drink anything. Take with no more than 4 ounces of water. Swallow the tablet whole and do not crush, chew, or break it.  Eat within 30 to 60 minutes after taking Rybelsus .  Ozempic  and Wegovy  are injected under the skin, usually once per week at any time of the day, with or without food. Use an injection on the same day each week.  Prepare an injection only when you are ready to give it. Call your pharmacist if the medicine looks cloudy, has changed colors, or has particles in it.  Your healthcare provider will show you where to inject semaglutide . Do not inject into the same place two times in a row.  If you choose a different weekly injection day, start your new schedule after at least 2 days have passed since the last injection you gave.  Do not use different brands of semaglutide  at the same time.  Blood sugar can be affected by stress, illness, surgery, exercise, alcohol use, or skipping meals.  Low blood sugar  (hypoglycemia) can make you feel very hungry, dizzy, irritable, or shaky. To quickly treat hypoglycemia, eat or drink hard candy, crackers, raisins, fruit juice, or non-diet soda. Your doctor may prescribe glucagon  injection in case of severe hypoglycemia.  Tell your doctor if you have frequent symptoms of high blood sugar (hyperglycemia) such as increased thirst or urination. Ask your doctor before changing your dose or medication schedule.  Your treatment may also include diet, exercise, weight control, medical tests, and special medical care.  You may get dehydrated during prolonged illness. Call your doctor if you are sick with vomiting or diarrhea, or if you eat or drink less than usual.  Store Rybelsus  in the original package at room temperature, away from moisture and heat.  Store unopened Ozempic  or Wegovy  injection pens in the original carton in a refrigerator, protected from light. Do not use past the expiration date. Throw away an injection pen that has been frozen.  If needed, you may store an unopened Wegovy  pen at cool room temperature for up to 28 days. Do not remove the cap until you are ready to use the injection pen. The pen contains a single dose. Throw the pen away after one use, even if there is still medicine left inside.  The Ozempic  injection pen contains more than one dose. After your  first use, store the pen with the needle removed in a refrigerator or at room temperature. Protect from heat and light. Keep the cap on when not in use. Throw the pen away 56 days after the first use, or if less than 0.25 mg is shown on the dose counter.  Do not reuse a needle. Place it in a puncture-proof sharps container and dispose of it following state or local laws. Keep out of the reach of children and pets.  What happens if I miss a dose?  For Rybelsus : Skip the missed dose and use your next dose at the regular time.  For Ozempic : Use the medicine as soon as you can and then go back to your regular  schedule. If you are more than 5 days late for the injection, skip the missed dose and return to your regular schedule.  For Wegovy : Use the medicine as soon as you can and then go back to your regular schedule. If your next dose is due in less than 2 days (48 hours), skip the missed dose and return to your regular schedule.  Do not use two doses of semaglutide  at one time.  Call your doctor if you miss more than 2 doses in a row of Wegovy . You may need to restart the medicine at a lower dose to avoid stomach problems.  What happens if I overdose?  Seek emergency medical attention or call the Poison Help line at [phone].  Overdose may cause severe nausea, vomiting, or low blood sugar.  What should I avoid while using semaglutide ?  Never share an injection pen, even if you changed the needle. Sharing this device can pass infection or disease from person to person.  What are the possible side effects of semaglutide ?  Get emergency medical help if you have signs of an allergic reaction: hives, itching; dizziness, fast heartbeats; difficult breathing; swelling of your face, lips, tongue, or throat.  Call your doctor at once if you have:  vision changes;  unusual mood changes, thoughts about hurting yourself;  pounding heartbeats or fluttering in your chest;  a light-headed feeling, like you might pass out;  signs of a thyroid  tumor--swelling or a lump in your neck, trouble swallowing, a hoarse voice, feeling short of breath;  symptoms of pancreatitis--severe pain in your upper stomach spreading to your back, nausea with or without vomiting, fast heart rate;  gallbladder problems--upper stomach pain, fever, clay-colored stools, jaundice (yellowing of the skin or eyes);  low blood sugar--headache, hunger, weakness, sweating, confusion, irritability, dizziness, fast heart rate, or feeling jittery;  kidney problems--swelling, urinating less, feeling tired or short of breath; or  stomach flu symptoms--stomach cramps,  vomiting, loss of appetite, diarrhea (may be watery or bloody).  Common side effects may include:  low blood sugar (in people with type 2 diabetes);  upset stomach, heartburn, burping, gas, bloating;  nausea, vomiting, stomach pain, loss of appetite;  diarrhea, constipation;  runny nose or sore throat;  stomach flu symptoms; or  headache, dizziness, tiredness.  This is not a complete list of side effects and others may occur. Call your doctor for medical advice about side effects. You may report side effects to FDA at 1-800-FDA-1088.  What other drugs will affect semaglutide ?  Semaglutide  can slow your digestion, and it may take longer for your body to absorb any medicines you take by mouth.  Tell your doctor about all your other medicines, especially insulin  or other diabetes medicine, such as dulaglutide, exenatide, liraglutide, Byetta, Trulicity,  Victoza, and others.  Other drugs may affect semaglutide , including prescription and over-the-counter medicines, vitamins, and herbal products. Tell your doctor about all other medicines you use.  Where can I get more information?  Your doctor or pharmacist can provide more information about semaglutide .          FOLLOW UP   Return in about 1 month (around 01/21/2024).    [name], NP

## 2024-01-08 ENCOUNTER — Encounter (INDEPENDENT_AMBULATORY_CARE_PROVIDER_SITE_OTHER): Payer: Self-pay

## 2024-01-08 ENCOUNTER — Encounter (INDEPENDENT_AMBULATORY_CARE_PROVIDER_SITE_OTHER): Payer: Self-pay | Admitting: Registered Nurse

## 2024-01-08 DIAGNOSIS — I25119 Atherosclerotic heart disease of native coronary artery with unspecified angina pectoris: Secondary | ICD-10-CM

## 2024-01-15 ENCOUNTER — Telehealth: Admitting: Registered Nurse

## 2024-01-15 MED ORDER — NALTREXONE HCL (PAIN) 4.5 MG PO CAPS
1.0000 | ORAL_CAPSULE | Freq: Every day | ORAL | 0 refills | Status: AC | PRN
Start: 2024-01-15 — End: ?

## 2024-01-15 NOTE — Progress Notes (Signed)
 [facility] Video Visit    Interactive real time audio and visual communication used for the telehealth visit and patient gave consent.  HPI   Kristen Olson is a 28 year old female with history of RA, Fibromyalgia, and chronic migraines who presents to clinic for Pain    Last seen 11/21/23. Was started on Journavax but had no relief with meds. Continues on Lyrica 50mg  qid, Methotrexate and Oral Steroids prn. She has not tried LDN but open to it.     PHQ:       No data to display                 REVIEW OF SYSTEMS    Review of Systems  Negative except for those in HPI      PAST MEDICAL AND SURGICAL HISTORY:    has a past surgical history that includes Cardiac defibrillator placement (09/2017) and Gallbladder surgery (01/2023).  Patient Active Problem List   Diagnosis    Surveillance for birth control, oral contraceptives    Gastroesophageal reflux disease, unspecified whether esophagitis present    Irritable bowel syndrome with constipation    Lower abdominal pain    Calcium  channel blocker overdose, intentional self-harm, initial encounter (CMS-HCC)    ICD (implantable cardioverter-defibrillator) in place    Migraines    Asthma    Depression        CURRENT MEDICATIONS:    Current Outpatient Medications:     albuterol  108 (90 Base) MCG/ACT inhaler, Inhale 1 puff by mouth every 6 hours as needed for Wheezing., Disp: 6.7 g, Rfl: 0    buPROPion  (WELLBUTRIN  XL) 300 MG XL tablet, Take 1 tablet (300 mg) by mouth every morning., Disp: 90 tablet, Rfl: 0    Cholecalciferol (VITAMIN D3) 125 MCG (5000 UT) tablet, Take 1 tablet (5,000 Units) by mouth daily., Disp: 30 tablet, Rfl: 3    docusate sodium  (COLACE) 250 MG capsule, TAKE 1 CAPSULE (250 MG) BY MOUTH 2 TIMES DAILY, Disp: 60 capsule, Rfl: 2    fluocinolon (DERMA-SMOOTHE /FS) 0.01 % external oil, Apply a thin layer to affected area on scalp nightly then rinse in the morning. Do this every night for 2 to 3 weeks then use 2 nights per week as needed for for  maintenance., Disp: 118 mL, Rfl: 3    hydrOXYzine  HCL (ATARAX ) 10 MG tablet, Take 1 tablet (10 mg) by mouth every 8 hours as needed., Disp: , Rfl:     Magnesium  Oxide 500 MG CAPS, TAKE 1 CAPSULE BY MOUTH EVERY DAY, Disp: 30 capsule, Rfl: 11    methotrexate (TREXALL) 2.5 MG tablet, Take 1 tablet (2.5 mg) by mouth, Disp: , Rfl:     montelukast  (SINGULAIR ) 10 MG tablet, Take 1 tablet (10 mg) by mouth every evening., Disp: 90 tablet, Rfl: 0    Multiple Vitamin (ONE-A-DAY ESSENTIAL) TABS, Take 1 tablet by mouth daily., Disp: , Rfl:     Naltrexone  HCl, Pain, 4.5 MG CAPS, Take 1 Capful by mouth daily as needed (pain)., Disp: 30 capsule, Rfl: 0    nitroGLYcerin  (NITROSTAT ) 0.4 MG SL tablet, 1 tablet (0.4 mg) by Sublingual route every 5 minutes as needed for Chest Pain. up to 3 tabs per episode., Disp: 20 tablet, Rfl: 0    ondansetron  (ZOFRAN ) 4 MG tablet, Take 1 tablet (4 mg) by mouth every 8 hours as needed for Nausea., Disp: 30 tablet, Rfl: 0    predniSONE (DELTASONE) 5 MG tablet, , Disp: , Rfl:  pregabalin (LYRICA) 50 MG capsule, , Disp: , Rfl:     tirzepatide -weight management (ZEPBOUND ) 2.5 MG/0.5ML injection pen, Inject 0.5 mL (2.5 mg) under the skin every 7 days., Disp: 2 mL, Rfl: 0    venlafaxine  XR (EFFEXOR  XR) 150 MG capsule, Take 1 capsule (150 mg) by mouth daily., Disp: 90 capsule, Rfl: 0    venlafaxine  XR (EFFEXOR  XR) 75 MG capsule, Take 1 capsule (75 mg) by mouth daily., Disp: 90 capsule, Rfl: 0     ALLERGIES:  Gluten meal    FAMILY HISTORY:  family history includes Cancer in her m grandmother; Cholesterol/Lipid Disorder in her mother; Heart Disease in her mother.    SOCIAL HISTORY:     none     Socioeconomic History    Marital status: Married   Occupational History    Occupation: Engineer, building services, previously Pension scheme manager Rep   Tobacco Use    Smoking status: Never    Smokeless tobacco: Never   Substance and Sexual Activity    Alcohol use: Not Currently    Drug use: Never    Sexual activity: Yes      Partners: Male     Birth control/protection: Pill        PHYSICAL EXAMINATION    There were no vitals filed for this visit.  There is no height or weight on file to calculate BMI.  Wt Readings from Last 5 Encounters:   12/21/23 97.5 kg (215 lb)   09/14/23 95.3 kg (210 lb)   08/30/23 95.3 kg (210 lb)   06/21/23 94.8 kg (209 lb)   04/04/23 99.8 kg (220 lb)     Blood Pressure   07/23/22 (!) 116/53   10/10/21 108/71   06/09/21 90/62   03/25/18 100/68   05/08/14 121/67     Physical Exam  GEN: in NAD, A&O, pleasant, non-toxic appearing  EENT: No injection or icterus.   Psych: Alert and Oriented. Affect appropriate.       ASSESSMENT AND PLAN    1. Rheumatoid arthritis involving multiple sites with positive rheumatoid factor (CMS-HCC)    - Naltrexone  HCl, Pain, 4.5 MG CAPS; Take 1 Capful by mouth daily as needed (pain).  Dispense: 30 capsule; Refill: 0    2. Chronic migraine with aura and with status migrainosus, not intractable    - Naltrexone  HCl, Pain, 4.5 MG CAPS; Take 1 Capful by mouth daily as needed (pain).  Dispense: 30 capsule; Refill: 0    3. Fibromyalgia    - Naltrexone  HCl, Pain, 4.5 MG CAPS; Take 1 Capful by mouth daily as needed (pain).  Dispense: 30 capsule; Refill: 0      There are no Patient Instructions on file for this visit.    FOLLOW UP   Return in about 4 weeks (around 02/12/2024).    Vena Toribio Primrose, NP

## 2024-01-16 ENCOUNTER — Ambulatory Visit (INDEPENDENT_AMBULATORY_CARE_PROVIDER_SITE_OTHER)

## 2024-01-17 ENCOUNTER — Encounter (INDEPENDENT_AMBULATORY_CARE_PROVIDER_SITE_OTHER): Payer: Self-pay

## 2024-01-17 MED ORDER — TIRZEPATIDE-WEIGHT MANAGEMENT 2.5 MG/0.5ML SC SOAJ
2.5000 mg | SUBCUTANEOUS | 0 refills | Status: DC
Start: 2024-01-17 — End: 2024-02-04

## 2024-01-24 ENCOUNTER — Encounter (INDEPENDENT_AMBULATORY_CARE_PROVIDER_SITE_OTHER): Payer: Self-pay

## 2024-01-30 ENCOUNTER — Other Ambulatory Visit (INDEPENDENT_AMBULATORY_CARE_PROVIDER_SITE_OTHER)

## 2024-02-02 ENCOUNTER — Other Ambulatory Visit (INDEPENDENT_AMBULATORY_CARE_PROVIDER_SITE_OTHER): Payer: Self-pay | Admitting: Physician Assistant

## 2024-02-02 DIAGNOSIS — J45909 Unspecified asthma, uncomplicated: Secondary | ICD-10-CM

## 2024-02-04 ENCOUNTER — Encounter (INDEPENDENT_AMBULATORY_CARE_PROVIDER_SITE_OTHER): Payer: Self-pay

## 2024-02-04 MED ORDER — TIRZEPATIDE-WEIGHT MANAGEMENT 2.5 MG/0.5ML SC SOAJ
2.5000 mg | SUBCUTANEOUS | 0 refills | Status: AC
Start: 2024-02-04 — End: ?

## 2024-02-04 NOTE — Addendum Note (Signed)
 Addended by: WILFRED PIMENTA on: 02/04/2024 09:08 AM     Modules accepted: Orders

## 2024-02-04 NOTE — Telephone Encounter (Signed)
 Pt has been informed for approval rx montelukast 

## 2024-02-04 NOTE — Telephone Encounter (Signed)
 sent

## 2024-02-05 ENCOUNTER — Encounter (INDEPENDENT_AMBULATORY_CARE_PROVIDER_SITE_OTHER): Payer: Self-pay

## 2024-02-05 LAB — FSH AND LH-CONSOLIDATED
FSH: 17.1 m[IU]/mL
LH: 51.5 m[IU]/mL

## 2024-02-05 LAB — CBG WITH FREE CORTISOL - LABCORP
Cort.Bind.Glob.(CBG): 2.1 mg/dL
Cortisol, Serum LCMS: 5.2 ug/dL
Free Cortisol, Serum: 0.19 ug/dL — ABNORMAL LOW
Percent Free Cortisol, Serum: 3.7 %

## 2024-02-05 LAB — CORTISOL, BLOOD: Cortisol: 4.4 ug/dL — ABNORMAL LOW (ref 6.2–19.4)

## 2024-02-05 LAB — ESTRADIOL, BLOOD: Estradiol: 238 pg/mL

## 2024-02-05 LAB — TESTOSTERONE, FREE AND TOTAL-CONSOLIDATED
Testosterone, Free: 2.1 pg/mL (ref 0.0–4.2)
Testosterone, Serum: 39 ng/dL (ref 13–71)

## 2024-02-05 LAB — 17-HYDROXYPROGESTERONE, HPLC-MS/MS, BLOOD: OH PROGESTERONE LCMS: 149 ng/dL

## 2024-02-05 LAB — ADRENOCORTICOTROPIC HORM., BLOOD: ACTH, Plasma: 13.8 pg/mL (ref 7.2–63.3)

## 2024-02-05 LAB — PROGESTERONE: Progesterone: 0.5 ng/mL

## 2024-02-05 LAB — DHEA-SULFATE, SERUM -LABCORP: DHEA-Sulfate, LCMS: 86 ug/dL

## 2024-02-06 ENCOUNTER — Other Ambulatory Visit (INDEPENDENT_AMBULATORY_CARE_PROVIDER_SITE_OTHER): Payer: Self-pay | Admitting: Physician Assistant

## 2024-02-06 DIAGNOSIS — R110 Nausea: Secondary | ICD-10-CM

## 2024-02-08 ENCOUNTER — Encounter (INDEPENDENT_AMBULATORY_CARE_PROVIDER_SITE_OTHER): Payer: Self-pay | Admitting: Hospital

## 2024-02-08 MED ORDER — ONDANSETRON HCL 4 MG OR TABS: 4.0000 mg | ORAL_TABLET | Freq: Three times a day (TID) | ORAL | 0 refills | Status: DC | PRN

## 2024-02-09 ENCOUNTER — Other Ambulatory Visit (INDEPENDENT_AMBULATORY_CARE_PROVIDER_SITE_OTHER): Payer: Self-pay

## 2024-02-12 ENCOUNTER — Ambulatory Visit (INDEPENDENT_AMBULATORY_CARE_PROVIDER_SITE_OTHER): Payer: Self-pay

## 2024-02-18 ENCOUNTER — Encounter (INDEPENDENT_AMBULATORY_CARE_PROVIDER_SITE_OTHER): Payer: Self-pay

## 2024-02-25 ENCOUNTER — Telehealth

## 2024-02-25 LAB — SPECIMEN STATUS REPORT-LABCORP

## 2024-02-25 LAB — SALIVARY CORTISOL X3, TIMED - LABCORP
#1 Salivary Cortisol - LabCorp: 0.139 ug/dL
#2 Salivary Cortisol - LabCorp: 0.081 ug/dL
#3 Salivary Cortisol - LabCorp: 0.048 ug/dL

## 2024-02-25 MED ORDER — DEXAMETHASONE 1 MG OR TABS
1.0000 mg | ORAL_TABLET | Freq: Once | ORAL | 0 refills | Status: AC
Start: 2024-02-25 — End: 2024-02-25

## 2024-02-25 NOTE — Progress Notes (Signed)
 [facility] Endocrinology Note  Interactive real time audio and visual communication used for the telehealth visit and patient gave consent.    HPI   Kristen Olson is a 28 year old female of Hispanic descent who presents to clinic for Results  . 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 PCP is [name]    INTERVAL HX:  Patient presents today for televisit to review test results.  Patient reports having a menstrual bleed twice last month - in first week of September and just had another bleed last week. Reports both episodes lasting 7 days.  No other changes since last visit.  Patient states salivary testing completed at midnight for all three days.  No longer on birth control since July 2025.    CONSULT:   Patient is self-referred for hormone evaluation in the setting of weight gain.    #Weight  Patient has been experiencing issues with weight for the past 3 years.  Patient felt that she could gain 20lbs in a month despite not doing anything differently.  She has gone to several different providers and says they have not had any explanation for her weight.  She has had a hard time losing weight despite changes in exercise or diet.  She reports PCP was previously concerned for Cushing's and referred to see an endo.  Patient was seen by a [facility] endocrinologist about 2-3 years ago whom after reviewing her case felt that it was not related to Cushing's and recommended weight loss.  Per patient, no blood work or testing was done at the time.  Started on Wegovy  0.25mg  at [facility] (see note from 07/23/2023 with pharmD Curtistine Burows) with and then switched to [facility].  Currently seeing NP [name] at [facility] management, last visit was April 2025.  Patient reports she was taking Metformin  at some point but stopped in favor of Wegovy .  Patient is  now on Wegovy  0.5mg  and reports obtaining through [facility] pharmacy.  Feels over the past few months she has been gaining weight despite being on Wegovy .  She says that a higher dosage has not been offered due to concerns for GI upset and feeling sick.  She reports weight tends to fluctuate between 210-220lbs.  Was taking Metformin  but they stopped it, by Weight management, in favor or Wegovy   Patient is taking Wegovy  0.5mg  through [facility] management, but had to be sent through [facility], been on Wegovy  for a few months now, gaining weight, didn't want to increase due to feeling sick since she currently experiences nausea and sometimes vomiting while on therapy.  She is not as hungry anymore but feels that weight loss has not been very successful.  Believes she may have lost 10lbs from feeling sick.    Metformin  - stopped after starting wegovy   Phentermine - CI due to cardiac disease. Pt has ICD in place for management ventricular arrhythmia   Qsymia - CI due to cardiac disease   Wellbutrin  - already using medication, ineffective for weight loss  Topiramate used for migraines in 2014 for 1 year, caused side effects so it was discontinued     #Menses  Menarche at age [age]. Had a painful ovarian cyst at age 30 prompting hospital visit. Patient was suggested to start OCP at the hospital.  Patient has been on and off OCPs since the age of 7. She reports sometimes she'll have two  periods in a month, or no periods for several months.   There are periods of time when she does not take it for months, sometimes will stop because of insurance, or will stop because she is waiting for script to be filled.  She has now been consistently on OCP for the past year.  Takes the placebo week but has not had a period over the past year.    #Rheumatoid Arthritis  Patient is being followed by rheumatologist Dr. Tristan Coffer for rheumatoid arthritis who is prescribing her daily prednisone started 4-5 mos ago.  She  is currently on a taper down regimen for the prednisone with plans to stop in about a month.    #Hx of ICD and ventricular tachycardia  Patient is being followed by cardiologist for history of ventricular fibrillation/tachycardia with history of cardiac arrest s/p ICD and CPVT with microvascular anginal spasms.  Mother with hx of heart disease.  Followed by Dr. Curtistine Hoe who is aware she is on GLP-1.  Per patient he prefers it due to cardiac side effects of other weight loss drugs.    Also known to pain management physical therapist Dagoberto Martins.  PCP Armida Scull. Patient reports she has not done a physical exam in awhile.    REVIEW OF SYSTEMS    Review of Systems  Negative except for those in HPI      PAST MEDICAL AND SURGICAL HISTORY:    has a past surgical history that includes Cardiac defibrillator placement (09/2017) and Gallbladder surgery (01/2023).  Patient Active Problem List   Diagnosis    Surveillance for birth control, oral contraceptives    Gastroesophageal reflux disease, unspecified whether esophagitis present    Irritable bowel syndrome with constipation    Lower abdominal pain    Calcium  channel blocker overdose, intentional self-harm, initial encounter (CMS-HCC)    ICD (implantable cardioverter-defibrillator) in place    Migraines    Asthma    Depression       CURRENT MEDICATIONS:    Current Outpatient Medications:     albuterol  108 (90 Base) MCG/ACT inhaler, Inhale 1 puff by mouth every 6 hours as needed for Wheezing., Disp: 6.7 g, Rfl: 0    buPROPion  (WELLBUTRIN  XL) 300 MG XL tablet, Take 1 tablet (300 mg) by mouth every morning., Disp: 90 tablet, Rfl: 0    Cholecalciferol (VITAMIN D3) 125 MCG (5000 UT) tablet, Take 1 tablet (5,000 Units) by mouth daily., Disp: 30 tablet, Rfl: 3    dexAMETHasone  (DECADRON ) 1 MG tablet, Take 1 tablet (1 mg) by mouth once for 1 dose. Please take at 11PM to 12AM the night before 8AM cortisol blood test, Disp: 1 tablet, Rfl: 0    docusate sodium  (COLACE) 250 MG  capsule, TAKE 1 CAPSULE (250 MG) BY MOUTH 2 TIMES DAILY, Disp: 60 capsule, Rfl: 2    fluocinolon (DERMA-SMOOTHE /FS) 0.01 % external oil, Apply a thin layer to affected area on scalp nightly then rinse in the morning. Do this every night for 2 to 3 weeks then use 2 nights per week as needed for for maintenance., Disp: 118 mL, Rfl: 3    hydrOXYzine  HCL (ATARAX ) 10 MG tablet, Take 1 tablet (10 mg) by mouth every 8 hours as needed., Disp: , Rfl:     Magnesium  Oxide 500 MG CAPS, TAKE 1 CAPSULE BY MOUTH EVERY DAY, Disp: 30 capsule, Rfl: 11    methotrexate (TREXALL) 2.5 MG tablet, Take 1 tablet (2.5 mg) by mouth, Disp: , Rfl:  montelukast  (SINGULAIR ) 10 MG tablet, TAKE 1 TABLET BY MOUTH EVERY EVENING, Disp: 90 tablet, Rfl: 0    Multiple Vitamin (ONE-A-DAY ESSENTIAL) TABS, Take 1 tablet by mouth daily., Disp: , Rfl:     Naltrexone  HCl, Pain, 4.5 MG CAPS, Take 1 Capful by mouth daily as needed (pain)., Disp: 30 capsule, Rfl: 0    nitroGLYcerin  (NITROSTAT ) 0.4 MG SL tablet, 1 tablet (0.4 mg) by Sublingual route every 5 minutes as needed for Chest Pain. up to 3 tabs per episode., Disp: 20 tablet, Rfl: 0    ondansetron  (ZOFRAN ) 4 MG tablet, Take 1 tablet (4 mg) by mouth every 8 hours as needed for Nausea., Disp: 30 tablet, Rfl: 0    predniSONE (DELTASONE) 5 MG tablet, , Disp: , Rfl:     pregabalin (LYRICA) 50 MG capsule, , Disp: , Rfl:     tirzepatide -weight management (ZEPBOUND ) 2.5 MG/0.5ML injection pen, Inject 0.5 mL (2.5 mg) under the skin every 7 days., Disp: 2 mL, Rfl: 0    venlafaxine  XR (EFFEXOR  XR) 150 MG capsule, Take 1 capsule (150 mg) by mouth daily., Disp: 90 capsule, Rfl: 0    venlafaxine  XR (EFFEXOR  XR) 75 MG capsule, Take 1 capsule (75 mg) by mouth daily., Disp: 90 capsule, Rfl: 0     ALLERGIES:  Gluten meal    FAMILY HISTORY:  family history includes Cancer in her m grandmother; Cholesterol/Lipid Disorder in her mother; Heart Disease in her mother.    SOCIAL HISTORY:     none     Socioeconomic History     Marital status: Married   Occupational History    Occupation: Engineer, building services, previously Pension scheme manager Rep   Tobacco Use    Smoking status: Never    Smokeless tobacco: Never   Substance and Sexual Activity    Alcohol use: Not Currently    Drug use: Never    Sexual activity: Yes     Partners: Male     Birth control/protection: Pill        PHYSICAL EXAMINATION    There were no vitals filed for this visit.  There is no height or weight on file to calculate BMI.  Wt Readings from Last 5 Encounters:   12/21/23 97.5 kg (215 lb)   09/14/23 95.3 kg (210 lb)   08/30/23 95.3 kg (210 lb)   06/21/23 94.8 kg (209 lb)   04/04/23 99.8 kg (220 lb)     Blood Pressure   07/23/22 (!) 116/53   10/10/21 108/71   06/09/21 90/62   03/25/18 100/68   05/08/14 121/67     Physical Exam  GEN: in NAD, A&O, pleasant, non-toxic appearing  EENT: No injection or icterus.   Psych: Alert and Oriented. Affect appropriate.       ASSESSMENT AND PLAN    Other specified endocrine disorders  28 yo female presenting today for hormone evaluation in the setting of weight gain. 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  Extensive discussion regarding labs and history.  05/2023 TSH 2.270  07/2022  TSH 1.14  06/2022 BG 103H, LFTS WNL  Denies renal disorders. Hx of fatty liver.  Hx of prediabetes.  Patient denies any known changes in jaw size, hat size.  No hx of TBI.  S/p consult  12/06/2023 [facility] 6.2/LH 5.5, estradiol  37.1, prog <0.1, PRL 9.5  Total testosterone  28, Uhpt neg  11/2023 TSH 2.480, neg TPO 28 (<34), previous  TPO neg in 2012  Patient reports stopping OCP two weeks prior to this blood test.  Prednisone stopped 3 weeks prior.  01/2024 ACTH 13.8L, CBG 2.1, Cortisol 5.2, free Cortisol 0.19L; overall cortisol 4.4L  FSH 17.1/LH 51.5, estradiol  238, 17-OH 149, Testosterone  39, DHEA-S 86  #1 Salivary 0.139  #2 Salivary  0.081  #3 Salivary 0.048  Elevated salivary cortisol x 1.  Patient denies any symptoms of adrenal insufficiency reviewed today.  Recommend proceed with 24-hr urine cortisol and dexamethasone  suppression test.  Reviewed testing protocol.  Patient to return for lab review once testing complete.    Sleep apnea  Patient aware sleep may affect cortisol serum levels.  Has recently been diagnosed with OSA and recommended CPAP.  Recommended patient proceed with therapy d/t impacts of uncontrolled OSA on overall health and metabolism.  Patient understands.    Prediabetes  08/03/2022 A1C 5.8%  11/2023 A1C 5.6%, FBG 91, fasting insulin  78.6H  Improved after using Wegovy  but patient has stopped Wegovy  d/t side effects. Reports no weight loss on Wegovy .  Discussed insulin  resistance which may worsen with prednisone.  Patient now on Zepbound  with [facility].    Weight gain  No longer on OCP or prednisone.  Proceed with cortisol as above.    Irregular menstrual cycle  Patient no longer on ortho-micronor  OCP.  Labs most indicative of ovulation at time of blood work. Pelvic US  leading up to blood work demonstrated dominant follicle.  Recommend repeat female hormones post-completion of cortisol workup as above.    Obesity (BMI 30-39.9)  Patient would benefit potentially from switching to Zepbound  due to persistent GI side effects of Wegovy .  Patient to return to [facility] management for further discussion.    Rheumatoid arthritis, involving unspecified site, unspecified whether rheumatoid factor present (CMS-HCC)  Prednisone will worsen weight gain. She has stopped several weeks ago.  No high blood pressure.  No moon facies, buffalo hump, violaceous striae, skin atrophy, proximal muscle weakness.  Continue f/u with rheum.    Patient Instructions   COMPLETE 24-HOUR URINE TESTING PRIOR TO DEXAMETHASONE  TEST. DO NOT OVERLAP THESE TESTS.        24-HOUR URINE COLLECTION - PATIENT INSTRUCTIONS      It is important that you do  the collection correctly.  An incomplete collection may lead to an improper diagnosis.       You are to collect every drop of urine you make during each 24-hour period, no matter how much or how little.   At the start time you have chosen, urinate and empty your bladder, flush it down the toilet and note the exact time. (Your start time is: ______________).  You will now have an empty bladder and an empty bottle, and you are now ready to do the collection.       Collect every drop of urine you make over the next 24-hours.  Finish the collection by voiding at exactly the same time the next morning; this last specimen should be added to the bottle.      The time of the final urine specimen should vary by no more than 5 or 10 minutes from the time of starting the collection the previous morning. If you have to urinate one hour before the start or stop time, drink a full glass of water or more so that you can urinate again at the appropriate time. If you have to urinate 20 minutes before, hold the urine until the proper time. This is  essential to the proper interpretation of the test results.     You will now have a full first 24-hour collection and an empty bladder.  If a 2nd collection is needed, you can start filling you next empty bottle with the next void, following the same instructions. Similar instructions apply to a third collection.  If you are doing more than one 24-hour urine collection, clearly label whether the bottle(s) belong to the 1st, 2nd or 3rd 24-hour collection.       The bottle(s) should be refrigerated in between collections.      If you need to have a bowel movement, any urine passed with the bowel movement should be collected. Try not to include feces with the urine collection. If feces does get mixed in, do not try to remove the feces from the urine collection bottle.               I have ordered a dexamethasone  suppression test for you. Please go to your pharmacy to pick up the dexamethasone   prescription. Take the dexamethasone  1mg  at 11pm-12am the night before your blood draw. Your blood draw should be done the next day at 8AM.   You should do your test at [facility] due to the early time frame. Schedule an appointment at [email or web address]            Patient to go to [facility] with any significant symptoms.     FOLLOW UP   Return in about 3 weeks (around 03/17/2024) for follow up, lab review.    Annabella Shoe, [place]

## 2024-02-25 NOTE — Patient Instructions (Addendum)
 COMPLETE 24-HOUR URINE TESTING PRIOR TO DEXAMETHASONE  TEST. DO NOT OVERLAP THESE TESTS.        24-HOUR URINE COLLECTION - PATIENT INSTRUCTIONS      It is important that you do the collection correctly.  An incomplete collection may lead to an improper diagnosis.       You are to collect every drop of urine you make during each 24-hour period, no matter how much or how little.   At the start time you have chosen, urinate and empty your bladder, flush it down the toilet and note the exact time. (Your start time is: ______________).  You will now have an empty bladder and an empty bottle, and you are now ready to do the collection.       Collect every drop of urine you make over the next 24-hours.  Finish the collection by voiding at exactly the same time the next morning; this last specimen should be added to the bottle.      The time of the final urine specimen should vary by no more than 5 or 10 minutes from the time of starting the collection the previous morning. If you have to urinate one hour before the start or stop time, drink a full glass of water or more so that you can urinate again at the appropriate time. If you have to urinate 20 minutes before, hold the urine until the proper time. This is essential to the proper interpretation of the test results.     You will now have a full first 24-hour collection and an empty bladder.  If a 2nd collection is needed, you can start filling you next empty bottle with the next void, following the same instructions. Similar instructions apply to a third collection.  If you are doing more than one 24-hour urine collection, clearly label whether the bottle(s) belong to the 1st, 2nd or 3rd 24-hour collection.       The bottle(s) should be refrigerated in between collections.      If you need to have a bowel movement, any urine passed with the bowel movement should be collected. Try not to include feces with the urine collection. If feces does get mixed in, do not try to  remove the feces from the urine collection bottle.               I have ordered a dexamethasone  suppression test for you. Please go to your pharmacy to pick up the dexamethasone  prescription. Take the dexamethasone  1mg  at 11pm-12am the night before your blood draw. Your blood draw should be done the next day at 8AM.   You should do your test at [facility] due to the early time frame. Schedule an appointment at [email or web address]            Patient to go to [facility] with any significant symptoms.

## 2024-02-26 ENCOUNTER — Ambulatory Visit (INDEPENDENT_AMBULATORY_CARE_PROVIDER_SITE_OTHER): Payer: Self-pay

## 2024-02-29 ENCOUNTER — Encounter (INDEPENDENT_AMBULATORY_CARE_PROVIDER_SITE_OTHER): Payer: Self-pay

## 2024-03-03 ENCOUNTER — Telehealth (INDEPENDENT_AMBULATORY_CARE_PROVIDER_SITE_OTHER): Payer: Self-pay | Admitting: Physician Assistant

## 2024-03-03 NOTE — Telephone Encounter (Deleted)
 Vestibular and physical therapy referral

## 2024-03-07 ENCOUNTER — Other Ambulatory Visit (INDEPENDENT_AMBULATORY_CARE_PROVIDER_SITE_OTHER): Payer: Self-pay

## 2024-03-07 ENCOUNTER — Telehealth (INDEPENDENT_AMBULATORY_CARE_PROVIDER_SITE_OTHER): Payer: Self-pay

## 2024-03-07 NOTE — Telephone Encounter (Signed)
 [facility] Encounter Note    Previous Telephone/MyChart encounter created for same issue?  No    Please describe the action taken by yourself in detail: Patient called to have [facility] order resent as [facility] did not have them and patient is there now. Reached out to provider and MA, MA replied and has refaxed.     This is just documentation in the patient chart, no further action is required.    My phone extension, if further information is needed: [phone]    Kristen Olson

## 2024-03-10 LAB — 24 HOUR URINE CREATININE
Creatinine, 24hr Urine: 915 mg/(24.h) (ref 800–1800)
Creatinine, Urine: 46.9 mg/dL

## 2024-03-10 LAB — CORTISOL, BLOOD

## 2024-03-10 LAB — ADRENOCORTICOTROPIC HORM., BLOOD

## 2024-03-10 LAB — DEXAMETHASONE, SERUM

## 2024-03-10 LAB — CORTISOL, URINE FREE
Cortisol,Free (24hr): 8 ug/(24.h) (ref 6–42)
Cortisol,Free, Urine (ug/L): 4 ug/L

## 2024-03-13 ENCOUNTER — Encounter (INDEPENDENT_AMBULATORY_CARE_PROVIDER_SITE_OTHER): Payer: Self-pay

## 2024-03-14 ENCOUNTER — Telehealth

## 2024-03-14 ENCOUNTER — Ambulatory Visit (INDEPENDENT_AMBULATORY_CARE_PROVIDER_SITE_OTHER)

## 2024-03-14 VITALS — Ht 61.0 in | Wt 208.0 lb

## 2024-03-14 LAB — CORTISOL, BLOOD: Cortisol: 1.3 ug/dL — ABNORMAL LOW (ref 6.2–19.4)

## 2024-03-14 LAB — DEXAMETHASONE, SERUM: Dexamethasone, Serum - LabCorp: 202 ng/dL

## 2024-03-14 LAB — ADRENOCORTICOTROPIC HORM., BLOOD: ACTH, Plasma: 5.4 pg/mL — ABNORMAL LOW (ref 7.2–63.3)

## 2024-03-14 MED ORDER — FLUTICASONE-SALMETEROL 100-50 MCG/ACT IN AEPB
1.0000 | INHALATION_SPRAY | Freq: Two times a day (BID) | RESPIRATORY_TRACT | 11 refills | Status: AC
Start: 2024-03-14 — End: ?

## 2024-03-14 MED ORDER — FLUTICASONE PROPIONATE 50 MCG/ACT NA SUSP
2.0000 | Freq: Every day | NASAL | 11 refills | Status: AC
Start: 2024-03-14 — End: ?

## 2024-03-14 NOTE — Patient Instructions (Signed)
 Visit a [facility] site or go to [facility] to get your blood drawn. No fasting is needed and you don't have to stop any allergy medication before the testing. You may schedule an appointment with us  at [email or web address] following the prompts to a medical assistant visit, OR you may schedule with [facility] at [email or web address]

## 2024-03-14 NOTE — Progress Notes (Signed)
 [facility] Video Visit    Interactive real time audio and visual communication used for the telehealth visit and patient gave consent.  HPI  Kristen Olson is a 28 year old female with history of allergies who presents to clinic for initial consultation. The patient has a known severe mold and HDM allergy. Uses Singulair  and albuterol , and despite these treatments she has remained symptomatic with asthma attacks at times, not currently. Also suffers with nasal congestion at times, though the asthma is the primary concern. Never been tested for allergies.     PCP: Wanda Farr, NP    REVIEW OF SYSTEMS  Review of Systems  Negative except for those in HPI      PAST MEDICAL AND SURGICAL HISTORY:    has a past surgical history that includes Cardiac defibrillator placement (09/2017) and Gallbladder surgery (01/2023).  Problem List[1]     CURRENT MEDICATIONS:  Current Medications[2]     ALLERGIES:  Gluten meal    FAMILY HISTORY:  family history includes Cancer in her m grandmother; Cholesterol/Lipid Disorder in her mother; Heart Disease in her mother.    SOCIAL HISTORY:     none     Socioeconomic History    Marital status: Married   Occupational History    Occupation: Engineer, building services, previously Pension scheme manager Rep   Tobacco Use    Smoking status: Never    Smokeless tobacco: Never   Substance and Sexual Activity    Alcohol use: Not Currently    Drug use: Never    Sexual activity: Yes     Partners: Male     Birth control/protection: Pill        PHYSICAL EXAM  There were no vitals filed for this visit.  Body mass index is 39.3 kg/m.  Wt Readings from Last 5 Encounters:   03/14/24 94.3 kg (208 lb)   12/21/23 97.5 kg (215 lb)   09/14/23 95.3 kg (210 lb)   08/30/23 95.3 kg (210 lb)   06/21/23 94.8 kg (209 lb)     Blood Pressure   07/23/22 (!) 116/53   10/10/21 108/71   06/09/21 90/62   03/25/18 100/68   05/08/14 121/67     Physical Exam  GEN: in NAD, A&O, pleasant, non-toxic appearing  EENT: No injection  or icterus.   Psych: Alert and Oriented. Affect appropriate.    ASSESSMENT AND PLAN  Kristen Olson was seen today for other.    Diagnoses and all orders for this visit:    Asthma, unspecified asthma severity, unspecified whether complicated, unspecified whether persistent    Allergic rhinitis, unspecified seasonality, unspecified trigger    Continue montelukast  10mg  nightly and add Symbicort 80/4.5 2 puffs BID, rinsing with water afterward.  Continue albuterol  prn cough/wheeze  IgE testing for inhalant and food allergens to guide allergen avoidance.   Consider OTC Nasacort  2 sprays per nostril or Flonase 2 sprays per nostril once nightly, will prescribe the latter and the patient will compare costs between Rx and OTC options.   Follow up in 2 weeks to review testing and treatment response.     Patient Instructions   Visit a [facility] site or go to [facility] to get your blood drawn. No fasting is needed and you don't have to stop any allergy medication before the testing. You may schedule an appointment with us  at [email or web address] following the prompts to a medical assistant visit, OR you may schedule with [facility] at [email or web address]      FOLLOW UP  Return in about 2 weeks (around 03/28/2024).    Fonda Ozell Chauncey Kristene, MD          [1]   Patient Active Problem List  Diagnosis    Surveillance for birth control, oral contraceptives    Gastroesophageal reflux disease, unspecified whether esophagitis present    Irritable bowel syndrome with constipation    Lower abdominal pain    Calcium  channel blocker overdose, intentional self-harm, initial encounter (CMS-HCC)    ICD (implantable cardioverter-defibrillator) in place    Migraines    Asthma    Depression    Allergic rhinitis   [2]   Current Outpatient Medications:     albuterol  108 (90 Base) MCG/ACT inhaler, Inhale 1 puff by mouth every 6 hours as needed for Wheezing., Disp: 6.7 g, Rfl: 0    buPROPion  (WELLBUTRIN  XL) 300 MG XL tablet, Take 1 tablet (300 mg) by  mouth every morning., Disp: 90 tablet, Rfl: 0    Cholecalciferol (VITAMIN D3) 125 MCG (5000 UT) tablet, Take 1 tablet (5,000 Units) by mouth daily., Disp: 30 tablet, Rfl: 3    docusate sodium  (COLACE) 250 MG capsule, TAKE 1 CAPSULE (250 MG) BY MOUTH 2 TIMES DAILY, Disp: 60 capsule, Rfl: 2    fluocinolon (DERMA-SMOOTHE /FS) 0.01 % external oil, Apply a thin layer to affected area on scalp nightly then rinse in the morning. Do this every night for 2 to 3 weeks then use 2 nights per week as needed for for maintenance. (Patient not taking: Reported on 03/14/2024), Disp: 118 mL, Rfl: 3    Magnesium  Oxide 500 MG CAPS, TAKE 1 CAPSULE BY MOUTH EVERY DAY, Disp: 30 capsule, Rfl: 11    methotrexate (TREXALL) 2.5 MG tablet, Take 1 tablet (2.5 mg) by mouth, Disp: , Rfl:     montelukast  (SINGULAIR ) 10 MG tablet, TAKE 1 TABLET BY MOUTH EVERY EVENING, Disp: 90 tablet, Rfl: 0    Multiple Vitamin (ONE-A-DAY ESSENTIAL) TABS, Take 1 tablet by mouth daily., Disp: , Rfl:     Naltrexone  HCl, Pain, 4.5 MG CAPS, Take 1 Capful by mouth daily as needed (pain)., Disp: 30 capsule, Rfl: 0    nitroGLYcerin  (NITROSTAT ) 0.4 MG SL tablet, 1 tablet (0.4 mg) by Sublingual route every 5 minutes as needed for Chest Pain. up to 3 tabs per episode., Disp: 20 tablet, Rfl: 0    ondansetron  (ZOFRAN ) 4 MG tablet, Take 1 tablet (4 mg) by mouth every 8 hours as needed for Nausea., Disp: 30 tablet, Rfl: 0    predniSONE (DELTASONE) 5 MG tablet, , Disp: , Rfl:     pregabalin (LYRICA) 50 MG capsule, , Disp: , Rfl:     tirzepatide -weight management (ZEPBOUND ) 2.5 MG/0.5ML injection pen, Inject 0.5 mL (2.5 mg) under the skin every 7 days., Disp: 2 mL, Rfl: 0    venlafaxine  XR (EFFEXOR  XR) 150 MG capsule, Take 1 capsule (150 mg) by mouth daily., Disp: 90 capsule, Rfl: 0    venlafaxine  XR (EFFEXOR  XR) 75 MG capsule, Take 1 capsule (75 mg) by mouth daily., Disp: 90 capsule, Rfl: 0

## 2024-03-16 ENCOUNTER — Ambulatory Visit (INDEPENDENT_AMBULATORY_CARE_PROVIDER_SITE_OTHER): Payer: Self-pay

## 2024-03-16 NOTE — Progress Notes (Unsigned)
 [facility] Endocrinology Note  Interactive real time audio and visual communication used for the telehealth visit and patient gave consent.    HPI   Kristen Olson is a 28 year old female of Hispanic descent who presents to clinic for No chief complaint on file.  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 PCP is [name]    INTERVAL HX:  Patient presents today for televisit to review test results.  Patient reports having a menstrual bleed twice last month - in first week of September and just had another bleed last week. Reports both episodes lasting 7 days.  No other changes since last visit.  Patient states salivary testing completed at midnight for all three days.  No longer on birth control since July 2025.    CONSULT:   Patient is self-referred for hormone evaluation in the setting of weight gain.    #Weight  Patient has been experiencing issues with weight for the past 3 years.  Patient felt that she could gain 20lbs in a month despite not doing anything differently.  She has gone to several different providers and says they have not had any explanation for her weight.  She has had a hard time losing weight despite changes in exercise or diet.  She reports PCP was previously concerned for Cushing's and referred to see an endo.  Patient was seen by a [facility] endocrinologist about 2-3 years ago whom after reviewing her case felt that it was not related to Cushing's and recommended weight loss.  Per patient, no blood work or testing was done at the time.  Started on Wegovy  0.25mg  at [facility] (see note from 07/23/2023 with pharmD Curtistine Burows) with and then switched to [facility].  Currently seeing NP [name] at [facility] Weight management, last visit was April 2025.  Patient reports she was taking Metformin  at some point but stopped in favor of  Wegovy .  Patient is now on Wegovy  0.5mg  and reports obtaining through [facility] pharmacy.  Feels over the past few months she has been gaining weight despite being on Wegovy .  She says that a higher dosage has not been offered due to concerns for GI upset and feeling sick.  She reports weight tends to fluctuate between 210-220lbs.  Was taking Metformin  but they stopped it, by Weight management, in favor or Wegovy   Patient is taking Wegovy  0.5mg  through [facility] management, but had to be sent through [facility], been on Wegovy  for a few months now, gaining weight, didn't want to increase due to feeling sick since she currently experiences nausea and sometimes vomiting while on therapy.  She is not as hungry anymore but feels that weight loss has not been very successful.  Believes she may have lost 10lbs from feeling sick.    Metformin  - stopped after starting wegovy   Phentermine - CI due to cardiac disease. Pt has ICD in place for management ventricular arrhythmia   Qsymia - CI due to cardiac disease   Wellbutrin  - already using medication, ineffective for weight loss  Topiramate used for migraines in 2014 for 1 year, caused side effects so it was discontinued     #Menses  Menarche at age [age]. Had a painful ovarian cyst at age 24 prompting hospital visit. Patient was suggested to start OCP at the hospital.  Patient has been on and off OCPs since the age of 38. She reports  sometimes she'll have two periods in a month, or no periods for several months.   There are periods of time when she does not take it for months, sometimes will stop because of insurance, or will stop because she is waiting for script to be filled.  She has now been consistently on OCP for the past year.  Takes the placebo week but has not had a period over the past year.    #Rheumatoid Arthritis  Patient is being followed by rheumatologist Dr. Tristan Coffer for rheumatoid arthritis who is prescribing her daily prednisone started  4-5 mos ago.  She is currently on a taper down regimen for the prednisone with plans to stop in about a month.    #Hx of ICD and ventricular tachycardia  Patient is being followed by cardiologist for history of ventricular fibrillation/tachycardia with history of cardiac arrest s/p ICD and CPVT with microvascular anginal spasms.  Mother with hx of heart disease.  Followed by Dr. Curtistine Hoe who is aware she is on GLP-1.  Per patient he prefers it due to cardiac side effects of other weight loss drugs.    Also known to pain management physical therapist Dagoberto Martins.  PCP Armida Scull. Patient reports she has not done a physical exam in awhile.    REVIEW OF SYSTEMS    Review of Systems  Negative except for those in HPI      PAST MEDICAL AND SURGICAL HISTORY:    has a past surgical history that includes Cardiac defibrillator placement (09/2017) and Gallbladder surgery (01/2023).  Patient Active Problem List   Diagnosis    Surveillance for birth control, oral contraceptives    Gastroesophageal reflux disease, unspecified whether esophagitis present    Irritable bowel syndrome with constipation    Lower abdominal pain    Calcium  channel blocker overdose, intentional self-harm, initial encounter (CMS-HCC)    ICD (implantable cardioverter-defibrillator) in place    Migraines    Asthma    Depression    Allergic rhinitis       CURRENT MEDICATIONS:    Current Outpatient Medications:     albuterol  108 (90 Base) MCG/ACT inhaler, Inhale 1 puff by mouth every 6 hours as needed for Wheezing., Disp: 6.7 g, Rfl: 0    buPROPion  (WELLBUTRIN  XL) 300 MG XL tablet, Take 1 tablet (300 mg) by mouth every morning., Disp: 90 tablet, Rfl: 0    Cholecalciferol (VITAMIN D3) 125 MCG (5000 UT) tablet, Take 1 tablet (5,000 Units) by mouth daily., Disp: 30 tablet, Rfl: 3    docusate sodium  (COLACE) 250 MG capsule, TAKE 1 CAPSULE (250 MG) BY MOUTH 2 TIMES DAILY, Disp: 60 capsule, Rfl: 2    fluocinolon (DERMA-SMOOTHE /FS) 0.01 % external oil,  Apply a thin layer to affected area on scalp nightly then rinse in the morning. Do this every night for 2 to 3 weeks then use 2 nights per week as needed for for maintenance. (Patient not taking: Reported on 03/14/2024), Disp: 118 mL, Rfl: 3    fluticasone propionate (FLONASE) 50 MCG/ACT nasal spray, Spray 2 sprays into each nostril daily., Disp: 16 g, Rfl: 11    fluticasone-salmeterol (ADVAIR) 100-50 MCG/ACT inhaler, Inhale 1 puff by mouth every 12 hours. Rinse with water and spit after use., Disp: 60 each, Rfl: 11    Magnesium  Oxide 500 MG CAPS, TAKE 1 CAPSULE BY MOUTH EVERY DAY, Disp: 30 capsule, Rfl: 11    methotrexate (TREXALL) 2.5 MG tablet, Take 1 tablet (2.5 mg) by mouth, Disp: ,  Rfl:     montelukast  (SINGULAIR ) 10 MG tablet, TAKE 1 TABLET BY MOUTH EVERY EVENING, Disp: 90 tablet, Rfl: 0    Multiple Vitamin (ONE-A-DAY ESSENTIAL) TABS, Take 1 tablet by mouth daily., Disp: , Rfl:     Naltrexone  HCl, Pain, 4.5 MG CAPS, Take 1 Capful by mouth daily as needed (pain)., Disp: 30 capsule, Rfl: 0    nitroGLYcerin  (NITROSTAT ) 0.4 MG SL tablet, 1 tablet (0.4 mg) by Sublingual route every 5 minutes as needed for Chest Pain. up to 3 tabs per episode., Disp: 20 tablet, Rfl: 0    ondansetron  (ZOFRAN ) 4 MG tablet, Take 1 tablet (4 mg) by mouth every 8 hours as needed for Nausea., Disp: 30 tablet, Rfl: 0    predniSONE (DELTASONE) 5 MG tablet, , Disp: , Rfl:     pregabalin (LYRICA) 50 MG capsule, , Disp: , Rfl:     tirzepatide -weight management (ZEPBOUND ) 2.5 MG/0.5ML injection pen, Inject 0.5 mL (2.5 mg) under the skin every 7 days., Disp: 2 mL, Rfl: 0    venlafaxine  XR (EFFEXOR  XR) 150 MG capsule, Take 1 capsule (150 mg) by mouth daily., Disp: 90 capsule, Rfl: 0    venlafaxine  XR (EFFEXOR  XR) 75 MG capsule, Take 1 capsule (75 mg) by mouth daily., Disp: 90 capsule, Rfl: 0     ALLERGIES:  Gluten meal    FAMILY HISTORY:  family history includes Cancer in her m grandmother; Cholesterol/Lipid Disorder in her mother; Heart Disease  in her mother.    SOCIAL HISTORY:     none     Socioeconomic History    Marital status: Married   Occupational History    Occupation: Engineer, building services, previously Pension scheme manager Rep   Tobacco Use    Smoking status: Never    Smokeless tobacco: Never   Substance and Sexual Activity    Alcohol use: Not Currently    Drug use: Never    Sexual activity: Yes     Partners: Male     Birth control/protection: Pill        PHYSICAL EXAMINATION    There were no vitals filed for this visit.  There is no height or weight on file to calculate BMI.  Wt Readings from Last 5 Encounters:   03/14/24 94.3 kg (208 lb)   12/21/23 97.5 kg (215 lb)   09/14/23 95.3 kg (210 lb)   08/30/23 95.3 kg (210 lb)   06/21/23 94.8 kg (209 lb)     Blood Pressure   07/23/22 (!) 116/53   10/10/21 108/71   06/09/21 90/62   03/25/18 100/68   05/08/14 121/67     Physical Exam  GEN: in NAD, A&O, pleasant, non-toxic appearing  EENT: No injection or icterus.   Psych: Alert and Oriented. Affect appropriate.       ASSESSMENT AND PLAN    Other specified endocrine disorders  28 yo female presenting today for hormone evaluation in the setting of weight gain. 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  Extensive discussion regarding labs and history.  05/2023 TSH 2.270  07/2022  TSH 1.14  06/2022 BG 103H, LFTS WNL  Denies renal disorders. Hx of fatty liver.  Hx of prediabetes.  Patient denies any known changes in jaw size, hat size.  No hx of TBI.  S/p consult  12/06/2023 FSH 6.2/LH 5.5, estradiol  37.1, prog <0.1, PRL 9.5  Total testosterone  28, Uhpt neg  11/2023 TSH 2.480,  neg TPO 28 (<34), previous TPO neg in 2012  Patient reports stopping OCP two weeks prior to this blood test.  Prednisone stopped 3 weeks prior.  01/2024 ACTH 13.8L, CBG 2.1, Cortisol 5.2, free Cortisol 0.19L; overall cortisol 4.4L  FSH 17.1/LH 51.5, estradiol  238,  17-OH 149, Testosterone  39, DHEA-S 86  #1 Salivary 0.139  #2 Salivary 0.081  #3 Salivary 0.048  Elevated salivary cortisol x 1.  02/2024 ACTH 5.4L, Cortisol 1.3L  24-hr urine cortisol 8  Patient denies any symptoms of adrenal insufficiency reviewed today.  Recommend proceed with 24-hr urine cortisol and dexamethasone  suppression test.  Reviewed testing protocol.  Patient to return for lab review once testing complete.    Sleep apnea  Patient aware sleep may affect cortisol serum levels.  Has recently been diagnosed with OSA and recommended CPAP.  Recommended patient proceed with therapy d/t impacts of uncontrolled OSA on overall health and metabolism.  Patient understands.    Prediabetes  08/03/2022 A1C 5.8%  11/2023 A1C 5.6%, FBG 91, fasting insulin  78.6H  Improved after using Wegovy  but patient has stopped Wegovy  d/t side effects. Reports no weight loss on Wegovy .  Discussed insulin  resistance which may worsen with prednisone.  Patient now on Zepbound  with [facility] management.    Weight gain  No longer on OCP or prednisone.  Proceed with cortisol as above.    Irregular menstrual cycle  Patient no longer on ortho-micronor  OCP.  Labs most indicative of ovulation at time of blood work. Pelvic US  leading up to blood work demonstrated dominant follicle.  Recommend repeat female hormones post-completion of cortisol workup as above.    Obesity (BMI 30-39.9)  Patient now on Zepbound  with [facility].  Hx of GI upset on Wegovy .    Rheumatoid arthritis, involving unspecified site, unspecified whether rheumatoid factor present (CMS-HCC)  Prednisone will worsen weight gain. She has stopped several weeks ago.  No high blood pressure.  No moon facies, buffalo hump, violaceous striae, skin atrophy, proximal muscle weakness.  Continue f/u with rheum.    There are no Patient Instructions on file for this visit.    FOLLOW UP   No follow-ups on file.    Annabella Shoe, [place]

## 2024-03-17 ENCOUNTER — Encounter (INDEPENDENT_AMBULATORY_CARE_PROVIDER_SITE_OTHER): Payer: Self-pay

## 2024-03-17 ENCOUNTER — Telehealth

## 2024-03-17 ENCOUNTER — Encounter (INDEPENDENT_AMBULATORY_CARE_PROVIDER_SITE_OTHER): Payer: Self-pay | Admitting: Hospital

## 2024-03-17 NOTE — Patient Instructions (Signed)
 Come into one of our offices or go to [facility] to get your blood drawn. You do not need to fast. Be sure to drink plenty of water. Please avoid biotin supplementation for at least 3 days prior to blood draw. You may schedule an appointment with us  at [email or web address] following the prompts to a medical assistant visit or by using this link   [email or web address]    Alternatively, you may schedule with [facility] at [email or web address]            Patient to go to [facility] with any significant symptoms.         I have placed a referral for [facility]. We will message you the name and phone number for the referral via mychart once it is submitted. If you aren't signed up for mychart we will send you a secure text message. You may then call the specialist and schedule the appointment.     If you have an HMO, your medical group will mail you a letter once they approve the referral, which can take up to 3 weeks. Please note--some specialists may require approval from your HMO before allowing you to schedule.     If you haven't received any information on your referral 2 weeks after your visit or if you are having difficulties with the referral, please give the referrals team a call directly at [phone].

## 2024-03-18 ENCOUNTER — Other Ambulatory Visit (INDEPENDENT_AMBULATORY_CARE_PROVIDER_SITE_OTHER)

## 2024-03-18 ENCOUNTER — Telehealth (INDEPENDENT_AMBULATORY_CARE_PROVIDER_SITE_OTHER)

## 2024-03-18 NOTE — Interdisciplinary (Signed)
 PROCEDURE(S) PERFORMED DURING MA VISIT    Procedure(s):   -VENIPUNTURE: Venous blood collected by Venipuncture. Patient tolerated well. Successful draw. Specimen will be sent to [facility]  Performed by Medical Assistant: Montie GORMAN Geanie Rosa and verified by Ronell Riggs   Additional Notes:   -VENIPUNCTURE: Specimen label verified by Patient, confirmed correct full name and date of birth      Most Recent Immunizations   Administered Date(s) Administered    COVID-19 (Moderna) Red Cap >= 12 Years 10/04/2019    COVID-19 (PFIZER) Seasonal Vaccine, >=12 years 08/02/2022     Rosaline A. Nadeau had no medications administered during this visit.

## 2024-03-19 ENCOUNTER — Encounter (INDEPENDENT_AMBULATORY_CARE_PROVIDER_SITE_OTHER): Payer: Self-pay

## 2024-03-19 ENCOUNTER — Telehealth

## 2024-03-19 LAB — ESTRADIOL, BLOOD: Estradiol: 29.4 pg/mL

## 2024-03-19 LAB — FSH AND LH-CONSOLIDATED
FSH: 7.5 m[IU]/mL
LH: 6.5 m[IU]/mL

## 2024-03-19 LAB — PROGESTERONE: Progesterone: <0.1 ng/mL

## 2024-03-19 MED ORDER — NORETHINDRONE (CONTRACEPTIVE) 0.35 MG OR TABS
1.0000 | ORAL_TABLET | Freq: Every day | ORAL | 0 refills | Status: AC
Start: 2024-03-19 — End: ?

## 2024-03-19 NOTE — Progress Notes (Signed)
 [facility] Video Visit    Interactive real time audio and visual communication used for the telehealth visit and patient gave consent.  HPI   Kristen Olson is a 28 year old female who presents to clinic for Menstrual Problem    The patient presents to discuss possible PCOS.    She has a history of frequent menses since menarche.  She reports having two menses in every month.  When she has a menses it lasts x 7 days and is heavy the entire time, where she needs to change a pad every 2 hours.  She endorses associated dysmenorrhea and near daily non-cyclic pelvic pain.    She endorses during menses that she has associated nausea, no constipation on colace BID or diarrhea.  She reports urinary frequency in addition to stress incontinence.    She was previously on progestin only pills which she was amenorrheic on.  She stopped them 2-3 months ago for hormone testing and menses have come back twice a month.    She was seen by endocrinology to check hormones and cortisol levels.    She reports she is insulin  resistant but not diabetic.  She has always struggled to lose weight.    Her mother has a history of endometriosis.    PHQ:       No data to display                   PCP: [facility]:      REVIEW OF SYSTEMS    Review of Systems  Negative except for those in HPI      PAST MEDICAL AND SURGICAL HISTORY:    has a past surgical history that includes Cardiac defibrillator placement (09/2017) and Gallbladder surgery (01/2023).  Problem List[1]     CURRENT MEDICATIONS:  Current Medications[2]     ALLERGIES:  Gluten meal and Molds & smuts    FAMILY HISTORY:  family history includes Cancer in her m grandmother; Cholesterol/Lipid Disorder in her mother; Heart Disease in her mother.    SOCIAL HISTORY:     none     Socioeconomic History    Marital status: Married   Occupational History    Occupation: Engineer, building services, previously Pension scheme manager Rep   Tobacco Use    Smoking status: Never    Smokeless tobacco:  Never   Substance and Sexual Activity    Alcohol use: Not Currently    Drug use: Never    Sexual activity: Yes     Partners: Male     Birth control/protection: Pill        PHYSICAL EXAMINATION    There were no vitals filed for this visit.  There is no height or weight on file to calculate BMI.  Wt Readings from Last 5 Encounters:   03/14/24 94.3 kg (208 lb)   12/21/23 97.5 kg (215 lb)   09/14/23 95.3 kg (210 lb)   08/30/23 95.3 kg (210 lb)   06/21/23 94.8 kg (209 lb)     Blood Pressure   07/23/22 (!) 116/53   10/10/21 108/71   06/09/21 90/62   03/25/18 100/68   05/08/14 121/67     Physical Exam  GEN: in NAD, A&O, pleasant, non-toxic appearing  EENT: No injection or icterus.   Psych: Alert and Oriented. Affect appropriate.     Most Recent Labs:  Lab Results   Component Value Date    TESTOSTERONE  39 01/30/2024    TESTOSTERONE  28 12/06/2023  Lab Results   Component Value Date    TESTOSTRNFRE 2.1 01/30/2024    TESTOSTRNFRE 1.5 12/06/2023     Lab Results   Component Value Date    ESTRADIOL  29.4 03/18/2024    ESTRADIOL  238.0 01/30/2024    ESTRADIOL  37.1 12/06/2023     Lab Results   Component Value Date    PROGESTERONE  <0.1 03/18/2024    PROGESTERONE  0.5 01/30/2024    PROGESTERONE  <0.1 12/06/2023     Lab Results   Component Value Date    FSH 7.5 03/18/2024    FSH 17.1 01/30/2024    FSH 6.2 12/06/2023     Lab Results   Component Value Date    LH 6.5 03/18/2024    LH 51.5 01/30/2024    LH 5.5 12/06/2023     Lab Results   Component Value Date    TSH 2.480 12/06/2023    TSH 2.270 06/28/2023     Lab Results   Component Value Date    HGB 13.8 12/06/2023    HGB 13.5 06/28/2023    HGB 12.4 07/23/2022    HCT 40.9 12/06/2023    HCT 41.9 06/28/2023    HCT 37.2 07/23/2022     Lab Results   Component Value Date    PROLACTIN 9.5 12/06/2023       ASSESSMENT AND PLAN      Assessment & Plan  Irregular menses  Reviewed hormones and testosterone  in normal range;  Labs and normal pelvic ultrasound are not consistent with PCOS;  Refer to  gynecology to discuss possible endometriosis;  Restart progestin only pills for management    Orders:    Consult/Referral to OBGYN    Pelvic ultrasound result:    FINDINGS:   UTERUS: No visible mass.  Uterine dimensions are 2.2 x  x 7.7 cm.   ENDOMETRIUM:  Endometrial thickness is 3.4 mm. No visible mass.   RIGHT ADNEXA: No visible solid adnexal mass.  Right ovary measures 2.4 x 1.8 x 2.7 cm.   LEFT ADNEXA: No visible solid adnexal mass.   Left ovary measures 3.1 x 4.7 x 4.9 cm.  Dominant ovarian follicle measuring 2.7 cm.   CUL DE SAC: No significant free fluid.   OTHER: None.   OVARIAN CYST MANAGEMENT: NOT APPLICABLE. Management applies only to simple and hemorrhagic cysts.     CONCLUSION:   Normal examination.   Dictated and Electronically Authenticated by: Sharlene Standing, M.D. on 01/20/2024 10:09 AM Proofread by: Sharlene Standing, M.D. on 01/20/2024 10:09 AM   Dysmenorrhea  Suspect endometriosis as also with non-cyclic pelvic pain and urinary symptoms    Orders:    Consult/Referral to OBGYN    Oral contraception initiation    Orders:    norethindrone  (ORTHO MICRONOR ) 0.35 MG tablet; Take 1 tablet by mouth daily.    On the day of the visit, I spent:  - 2 minutes prior to visit reviewing the Problem List, reviewing recent encounters, reviewing recent test results, and reviewing the patient's recent relevant medical history   - 15 minutes conducting the visit including time communicating with the patient, communicating the follow-up plan, recommending a treatment plan and it's risks, benefits, and alternatives, reviewing results, supportive counseling, and communicating the recommended evaluation  - 15 minutes after the visit working on documentation of clinical information in the electronic health record, arranging follow-up, coordinating care, and finalizing order(s)  For a total time of 32 minutes on the care of this patient.      There  are no Patient Instructions on file for this visit.    FOLLOW UP   Return if  symptoms worsen or fail to improve.    Comer Knock, NP        [1]   Patient Active Problem List  Diagnosis    Surveillance for birth control, oral contraceptives    Gastroesophageal reflux disease, unspecified whether esophagitis present    Irritable bowel syndrome with constipation    Lower abdominal pain    Calcium  channel blocker overdose, intentional self-harm, initial encounter (CMS-HCC)    ICD (implantable cardioverter-defibrillator) in place    Migraines    Asthma    Depression    Allergic rhinitis    Allergies    Arrhythmia    Dysmenorrhea    Steatosis of liver    Metrorrhagia    Tachycardia    Ventricular fibrillation (CMS-HCC)    Vitamin D  deficiency, unspecified    Weight gain    Scalp psoriasis   [2]   Current Outpatient Medications:     albuterol  108 (90 Base) MCG/ACT inhaler, Inhale 1 puff by mouth every 6 hours as needed for Wheezing., Disp: 6.7 g, Rfl: 0    buPROPion  (WELLBUTRIN  XL) 300 MG XL tablet, Take 1 tablet (300 mg) by mouth every morning., Disp: 90 tablet, Rfl: 0    Cholecalciferol (VITAMIN D3) 125 MCG (5000 UT) tablet, Take 1 tablet (5,000 Units) by mouth daily., Disp: 30 tablet, Rfl: 3    docusate sodium  (COLACE) 250 MG capsule, TAKE 1 CAPSULE (250 MG) BY MOUTH 2 TIMES DAILY, Disp: 60 capsule, Rfl: 2    fluticasone propionate (FLONASE) 50 MCG/ACT nasal spray, Spray 2 sprays into each nostril daily., Disp: 16 g, Rfl: 11    fluticasone-salmeterol (ADVAIR) 100-50 MCG/ACT inhaler, Inhale 1 puff by mouth every 12 hours. Rinse with water and spit after use., Disp: 60 each, Rfl: 11    Magnesium  Oxide 500 MG CAPS, TAKE 1 CAPSULE BY MOUTH EVERY DAY, Disp: 30 capsule, Rfl: 11    methotrexate (TREXALL) 2.5 MG tablet, Take 1 tablet (2.5 mg) by mouth, Disp: , Rfl:     montelukast  (SINGULAIR ) 10 MG tablet, TAKE 1 TABLET BY MOUTH EVERY EVENING, Disp: 90 tablet, Rfl: 0    Multiple Vitamin (ONE-A-DAY ESSENTIAL) TABS, Take 1 tablet by mouth daily., Disp: , Rfl:     Naltrexone  HCl, Pain, 4.5 MG CAPS,  Take 1 Capful by mouth daily as needed (pain)., Disp: 30 capsule, Rfl: 0    nitroGLYcerin  (NITROSTAT ) 0.4 MG SL tablet, 1 tablet (0.4 mg) by Sublingual route every 5 minutes as needed for Chest Pain. up to 3 tabs per episode., Disp: 20 tablet, Rfl: 0    norethindrone  (ORTHO MICRONOR ) 0.35 MG tablet, Take 1 tablet by mouth daily., Disp: 364 tablet, Rfl: 0    ondansetron  (ZOFRAN ) 4 MG tablet, Take 1 tablet (4 mg) by mouth every 8 hours as needed for Nausea., Disp: 30 tablet, Rfl: 0    pregabalin (LYRICA) 50 MG capsule, , Disp: , Rfl:     tirzepatide -weight management (ZEPBOUND ) 2.5 MG/0.5ML injection pen, Inject 0.5 mL (2.5 mg) under the skin every 7 days., Disp: 2 mL, Rfl: 0    venlafaxine  XR (EFFEXOR  XR) 150 MG capsule, Take 1 capsule (150 mg) by mouth daily., Disp: 90 capsule, Rfl: 0    venlafaxine  XR (EFFEXOR  XR) 75 MG capsule, Take 1 capsule (75 mg) by mouth daily., Disp: 90 capsule, Rfl: 0

## 2024-03-19 NOTE — Assessment & Plan Note (Signed)
 Suspect endometriosis as also with non-cyclic pelvic pain and urinary symptoms    Orders:    Consult/Referral to OBGYN

## 2024-03-20 ENCOUNTER — Ambulatory Visit (INDEPENDENT_AMBULATORY_CARE_PROVIDER_SITE_OTHER): Payer: Self-pay

## 2024-03-20 ENCOUNTER — Telehealth (INDEPENDENT_AMBULATORY_CARE_PROVIDER_SITE_OTHER)

## 2024-03-20 MED ORDER — TIRZEPATIDE-WEIGHT MANAGEMENT 5 MG/0.5ML SC SOAJ
5.0000 mg | SUBCUTANEOUS | 0 refills | Status: AC
Start: 2024-03-20 — End: ?

## 2024-03-20 NOTE — Progress Notes (Signed)
 [facility] Video Visit    Note: I am providing obesity and metabolic medicine management for this patient.  I am not assuming primary care and the patient is aware they should continue to follow up with their PCP for all general medical concerns and health care maintenance.        Interactive real time audio and video communication used for the telehealth visit and patient gave consent.     HPI   Kristen Olson is a 28 year old female with history of Obesity, Prediabetes, ICD,  MDD, GAD, PTSD, past suicide attempt who presents to clinic for Weight Management follow up. Patient has a goal of achieving a normal BMI [18.5-24.9] to improve quality of life and reduce obesity-related complications.      Current BMI 39.30 kg/m- Morbid Obesity     Current weight loss medication: Zepbound  2.5mg    -10 lbs   Current weight: 208lbs  She is feeling good and does not have any side effects compared to Wegovy .   Side effects: pt denies   ADRs: pt denies   Appetite/ Food noise: well controlled     Diet: some protein, 2 meals a day. Not monitoring protein intake ~60 g daily      Exercise: 5 days a week, jogging and gym and does resistance training     OCPs: Ortho [facility]       REVIEW OF SYSTEMS    Review of Systems  Negative except for those in HPI    Denies History of gallstones or pancreatitis  Denies Hypersensitivity to drug or ingredient  Denies Personal or family history of medullary thyroid  cancer  Denies Multiple endocrine neoplasia syndrome type 2  Denies Suicidal ideation or history of suicidal ideation/attempt  Denies pregnancy, breastfeeding or planning to become pregnant        PAST MEDICAL AND SURGICAL HISTORY:    has a past surgical history that includes Cardiac defibrillator placement (09/2017) and Gallbladder surgery (01/2023).  Problem List[1]     CURRENT MEDICATIONS:  Current Medications[2]     ALLERGIES:  Gluten meal and Molds & smuts    FAMILY HISTORY:  family history includes Cancer in her m  grandmother; Cholesterol/Lipid Disorder in her mother; Heart Disease in her mother.    SOCIAL HISTORY:     none     Socioeconomic History    Marital status: Married   Occupational History    Occupation: Engineer, building services, previously Pension scheme manager Rep   Tobacco Use    Smoking status: Never    Smokeless tobacco: Never   Substance and Sexual Activity    Alcohol use: Not Currently    Drug use: Never    Sexual activity: Yes     Partners: Male     Birth control/protection: Pill        PHYSICAL EXAMINATION    There were no vitals filed for this visit.  There is no height or weight on file to calculate BMI.  Wt Readings from Last 5 Encounters:   03/14/24 94.3 kg (208 lb)   12/21/23 97.5 kg (215 lb)   09/14/23 95.3 kg (210 lb)   08/30/23 95.3 kg (210 lb)   06/21/23 94.8 kg (209 lb)     Blood Pressure   07/23/22 (!) 116/53   10/10/21 108/71   06/09/21 90/62   03/25/18 100/68   05/08/14 121/67     Physical Exam  PHYSICAL EXAMINATION [exam limited due to virtual assessment]:  Physical Exam  General: Respiratory rate WNL,  no acute distress, sitting comfortably  Head: Normocephalic, atraumatic  Eyes: EOMI, no eye injection, no eyelid swelling, no icterus  EENT: Mucous membranes moist, no visible lip cracking, no active nasal drainage  Neck: Supple, no visible goiter  Respiratory: No retractions, no nasal flaring, normal overall work of breathing  Cardio: No cyanosis  Skin: No visible rash or lesions, hair normal appearance   Psych: Normal affect, mood, and speech  Neuro: CN II-XII grossly intact    ASSESSMENT AND PLAN    1. Class 2 obesity without serious comorbidity with body mass index (BMI) of 39.0 to 39.9 in adult, unspecified obesity type  -Goal of minimum 100 grams of protein for optimal body recomposition. Protein drives a higher thermic affect, which requires more calories to process it. Protein helps with muscle preservation, and this in turn increases basal metabolic rate. More lean muscle= more calories burned at rest.    -Balance with complex carbohydrates (sweet potato, quinoa, brown rice) and vegetables for fiber for essential nutrients and balance of healthy micro biome in the gut.   -stick to lean protein choices with less fat such as chicken, [place], fish. Avoid excessive consumption of shrimp, which is high in cholesterol. Add in healthy fats from organic olive oil, avocado, fish, and omega 3 & 6 supplements.     1. Interaction: Birthcontrol pills and Zepbound   Zepbound  can delay stomach emptying, which may reduce how well oral birth control pills are absorbed -- especially after starting or increasing your dose.  2. Contraception Advice:  Use a backup method (e.g., condoms) for 4 weeks after starting Zepbound  and 4 weeks after each dose increase.  Non-oral birth control (IUD, implant, injection, patch, ring) is not affected.  3. OCP Use Reminders:  Take your pill at the same time every day.  If vomiting or severe diarrhea occurs within 3 hours of taking it, use backup contraception for 7 days.  - tirzepatide -weight management (ZEPBOUND ) 5 MG/0.5ML injection pen; Inject 0.5 mL (5 mg) under the skin every 7 days.  Dispense: 2 mL; Refill: 0      Medications: Options, side effects, and contraindications discussed in detail with patient.  911 protocol reviewed with patient      Priority: Encouraged patient to feel confident in who they are and to prioritize goals of health in order to improve quality of life and decrease obesity-related disease, rather than achieving a scale number or look.  Consider mental and emotional health throughout this process and always report any concerns.       Goal: Discussed current BMI and goal BMI between 18.5 and 24.9 and achieving 5-10% loss of current body weight.      Food intake: Reviewed the concept of daily output exceeding daily input.  Healthy food intake discussed in detail, including caloric intake goal between TBD, (based on [place] St Jeor equation and current activity level) calories  per day for achieving weight loss (DO NOT recommend <400 kcal/day).  Encouraged VEGETABLES and healthy proteins, minimizing sugars and carbohydrates.  Plenty of fluids.  Avoid sugary drinks.  DAILY FOOD JOURNAL (MyFitnessPal is a Garment/textile technologist for determining calories).         Exercise: Encouraged regular exercise, starting slow and increasing over time.  Goal of 30+ minutes 5-7 days per week, including both aerobic and resistance exercise.      Behavior Modification: Recommended daily food journal (can use PocketDoc as a Oceanographer, app e.g. MyFitnessPal, Calorie Counter - MyNetDiary, or notebook), self-monitoring with  self-weighing daily or twice weekly.  Encouraged long-term modification of food intake, physical activity, and behavior.  Counseling and self-help groups have proven to be helpful in this process.       Follow-up: Weekly for the first month, twice monthly during months 2-3, monthly thereafter until goals are achieved, and then as needed.  Recommended letting Primary Care Provider know patient is in Weight Management Program and about any new prescriptions prescribed     Resources: PocketDoc has tools including weight, blood pressure, food, fluids logs as well as appointment availability with our Nutritionist and Personal Trainer Valerie Hagermann.  Using these resources is highly recommended as an adjunct to the plan of care determined with your clinician.      Patient Instructions   What is the most important information I should know about tirzepatide ?  Call your doctor at once if you have signs of a thyroid  tumor, such as swelling or a lump in your neck, trouble swallowing, a hoarse voice, or shortness of breath.  You should not use this medicine if you or anyone in your family have or have ever had thyroid  cancer or a condition called multiple endocrine neoplasia syndrome.  What is tirzepatide ?  Mounjaro  is used together with diet and exercise to improve blood sugar control in adults  with type 2 diabetes mellitus.  This medicine is not for treating type 1 diabetes.  Zepbound  is used with diet and exercise to manage weight in overweight or obese adults who also have at least one weight-related medical condition such as type 2 diabetes, high blood pressure, high cholesterol, obstructive sleep apnea, or heart problems.  Tirzepatide  may also be used for purposes not listed in this medication guide.  What should I discuss with my healthcare provider before using tirzepatide ?  You should not use tirzepatide  if you are allergic to it, or if you have:  a personal or family history of medullary thyroid  carcinoma (a type of thyroid  cancer); or  multiple endocrine neoplasia type 2 (tumors in your glands).  Tell your doctor if you have or have ever had:  pancreas problems;  a severe stomach problem such as problems with digesting food or slowed emptying of your stomach (gastroparesis);  diabetic retinopathy (a diabetes complication that affects the eyes); or  kidney disease.  May harm an unborn baby. Do not use if you are pregnant. Tell your doctor if you become pregnant.  If you are pregnant, your name may be listed on a pregnancy registry to track the effects of tirzepatide  on the baby.  Tirzepatide  can make birth control pills less effective. Ask your doctor about other birth control options such as an injection, implant, skin patch, vaginal ring, condom, diaphragm, cervical cap, or contraceptive sponge. If you take birth control pills you may need to use different birth control options for 4 weeks after starting this medicine and for 4 weeks each time the dose is raised.  Ask a doctor if it is safe to breastfeed while using this medicine.  How should I use tirzepatide ?  Follow all directions on your prescription label and read all medication guides or instruction sheets. Your doctor may occasionally change your dose. Use the medicine exactly as directed.  Tirzepatide  is injected under the skin once per  week. If you change your dosing day, allow at least 3 days (72 hours) to pass between doses.  You may use tirzepatide  with or without food. Drink plenty of liquids while you are using this medicine.  Your  healthcare provider will show you where to inject tirzepatide . Do not inject into the same place two times in a row.  You may have low blood sugar (hypoglycemia) and feel very hungry, dizzy, irritable, confused, anxious, or shaky. To quickly treat hypoglycemia, eat or drink a fast-acting source of sugar (fruit juice, hard candy, crackers, raisins, or non-diet soda).  You may give an injection of tirzepatide  and insulin  in the same area (such as upper arm), but not right next to each other.  Do not mix insulin  and tirzepatide  in the same injection.  Do not share this medicine with another person, even if they have the same symptoms you have.  Blood sugar levels can be affected by stress, illness, surgery, exercise, alcohol use, or skipping meals. Ask your doctor before changing your dose or medication schedule.  Each injection pen or prefilled syringe is for one use only. Throw away after one use, even if there is still medicine left inside. Use a puncture-proof sharps container. Follow state or local laws about how to dispose of this container. Keep it out of the reach of children and pets.  Follow all storage instructions provided with tirzepatide . Your pharmacist can provide more information about how to store this medicine.  What happens if I miss a dose?  Use the medicine as soon as you can, but skip the missed dose if you are more than 4 days (96 hours) late for the dose. Do not use two doses at one time.  What happens if I overdose?  Seek emergency medical attention or call the Poison Help line at [phone].  What should I avoid while using tirzepatide ?  Never share an injection pen or prefilled syringe with another person, even if the needle has been changed. Sharing these devices can allow infections  or disease to pass from one person to another.  What are the possible side effects of tirzepatide ?  Stop using tirzepatide  and get emergency medical help if you have:  signs of an allergic reaction: hives; difficult breathing; feeling light-headed; swelling of your face, lips, tongue, or throat; or  pancreatitis--severe pain in your upper stomach spreading to your back, nausea and vomiting.  Some people have thoughts about suicide while using tirzepatide . Tell your doctor right away if you have any sudden changes in mood or behavior, or thoughts about suicide.  Call your doctor at once if you have:  severe stomach problems;  changes in your vision;  signs of a thyroid  tumor--swelling or a lump in your neck, trouble swallowing, a hoarse voice, or if you feel short of breath;  gallbladder problem--chalky-colored stools, stomach pain after eating, nausea, heartburn, bloating, and severe upper stomach pain that may spread to your back;  low blood sugar--headache, hunger, weakness, sweating, confusion, irritability, dizziness, fast heart rate, or feeling jittery; or  kidney problems--little or no urination, swelling in your feet or ankles, feeling tired or short of breath.  Common side effects may include:  hair loss;  tiredness;  signs of an allergic reaction;  bruising, swelling, warmth, redness, oozing, or bleeding where an injection was given;  nausea, diarrhea, decreased appetite, vomiting; or  heartburn, burping, constipation, indigestion, or stomach pain.  This is not a complete list of side effects and others may occur. Call your doctor for medical advice about side effects. You may report side effects to FDA at 1-800-FDA-1088.  What other drugs will affect tirzepatide ?  Tirzepatide  can slow your digestion, and it may take longer for your body to  absorb any medicines you take by mouth.  Tell your doctor about all your other medicines, especially:  insulin  or oral diabetes medicine.  This list is not complete.  Other drugs may affect tirzepatide , including prescription and over-the-counter medicines, vitamins, and herbal products. Not all possible drug interactions are listed here.  Where can I get more information?  Your doctor or pharmacist can provide more information about tirzepatide .      FOLLOW UP   Return in about 1 month (around 04/20/2024).    [name], NP        [1]   Patient Active Problem List  Diagnosis    Surveillance for birth control, oral contraceptives    Gastroesophageal reflux disease, unspecified whether esophagitis present    Irritable bowel syndrome with constipation    Lower abdominal pain    Calcium  channel blocker overdose, intentional self-harm, initial encounter (CMS-HCC)    ICD (implantable cardioverter-defibrillator) in place    Migraines    Asthma    Depression    Allergic rhinitis    Allergies    Arrhythmia    Dysmenorrhea    Steatosis of liver    Metrorrhagia    Tachycardia    Ventricular fibrillation (CMS-HCC)    Vitamin D  deficiency, unspecified    Weight gain    Scalp psoriasis   [2]   Current Outpatient Medications:     albuterol  108 (90 Base) MCG/ACT inhaler, Inhale 1 puff by mouth every 6 hours as needed for Wheezing., Disp: 6.7 g, Rfl: 0    buPROPion  (WELLBUTRIN  XL) 300 MG XL tablet, Take 1 tablet (300 mg) by mouth every morning., Disp: 90 tablet, Rfl: 0    Cholecalciferol (VITAMIN D3) 125 MCG (5000 UT) tablet, Take 1 tablet (5,000 Units) by mouth daily., Disp: 30 tablet, Rfl: 3    docusate sodium  (COLACE) 250 MG capsule, TAKE 1 CAPSULE (250 MG) BY MOUTH 2 TIMES DAILY, Disp: 60 capsule, Rfl: 2    fluticasone propionate (FLONASE) 50 MCG/ACT nasal spray, Spray 2 sprays into each nostril daily., Disp: 16 g, Rfl: 11    fluticasone-salmeterol (ADVAIR) 100-50 MCG/ACT inhaler, Inhale 1 puff by mouth every 12 hours. Rinse with water and spit after use., Disp: 60 each, Rfl: 11    Magnesium  Oxide 500 MG CAPS, TAKE 1 CAPSULE BY MOUTH EVERY DAY, Disp: 30 capsule, Rfl: 11    methotrexate  (TREXALL) 2.5 MG tablet, Take 1 tablet (2.5 mg) by mouth, Disp: , Rfl:     montelukast  (SINGULAIR ) 10 MG tablet, TAKE 1 TABLET BY MOUTH EVERY EVENING, Disp: 90 tablet, Rfl: 0    Multiple Vitamin (ONE-A-DAY ESSENTIAL) TABS, Take 1 tablet by mouth daily., Disp: , Rfl:     Naltrexone  HCl, Pain, 4.5 MG CAPS, Take 1 Capful by mouth daily as needed (pain)., Disp: 30 capsule, Rfl: 0    nitroGLYcerin  (NITROSTAT ) 0.4 MG SL tablet, 1 tablet (0.4 mg) by Sublingual route every 5 minutes as needed for Chest Pain. up to 3 tabs per episode., Disp: 20 tablet, Rfl: 0    norethindrone  (ORTHO MICRONOR ) 0.35 MG tablet, Take 1 tablet by mouth daily., Disp: 364 tablet, Rfl: 0    ondansetron  (ZOFRAN ) 4 MG tablet, Take 1 tablet (4 mg) by mouth every 8 hours as needed for Nausea., Disp: 30 tablet, Rfl: 0    pregabalin (LYRICA) 50 MG capsule, , Disp: , Rfl:     tirzepatide -weight management (ZEPBOUND ) 2.5 MG/0.5ML injection pen, Inject 0.5 mL (2.5 mg) under the skin every 7  days., Disp: 2 mL, Rfl: 0    tirzepatide -weight management (ZEPBOUND ) 5 MG/0.5ML injection pen, Inject 0.5 mL (5 mg) under the skin every 7 days., Disp: 2 mL, Rfl: 0    venlafaxine  XR (EFFEXOR  XR) 150 MG capsule, Take 1 capsule (150 mg) by mouth daily., Disp: 90 capsule, Rfl: 0    venlafaxine  XR (EFFEXOR  XR) 75 MG capsule, Take 1 capsule (75 mg) by mouth daily., Disp: 90 capsule, Rfl: 0

## 2024-03-20 NOTE — Patient Instructions (Signed)
 What is the most important information I should know about tirzepatide?  Call your doctor at once if you have signs of a thyroid tumor, such as swelling or a lump in your neck, trouble swallowing, a hoarse voice, or shortness of breath.  You should not use this medicine if you or anyone in your family have or have ever had thyroid cancer or a condition called multiple endocrine neoplasia syndrome.  What is tirzepatide?  Kristen Olson is used together with diet and exercise to improve blood sugar control in adults with type 2 diabetes mellitus.  This medicine is not for treating type 1 diabetes.  Zepbound is used with diet and exercise to manage weight in overweight or obese adults who also have at least one weight-related medical condition such as type 2 diabetes, high blood pressure, high cholesterol, obstructive sleep apnea, or heart problems.  Tirzepatide may also be used for purposes not listed in this medication guide.  What should I discuss with my healthcare provider before using tirzepatide?  You should not use tirzepatide if you are allergic to it, or if you have:  a personal or family history of medullary thyroid carcinoma (a type of thyroid cancer); or  multiple endocrine neoplasia type 2 (tumors in your glands).  Tell your doctor if you have or have ever had:  pancreas problems;  a severe stomach problem such as problems with digesting food or slowed emptying of your stomach (gastroparesis);  diabetic retinopathy (a diabetes complication that affects the eyes); or  kidney disease.  May harm an unborn baby. Do not use if you are pregnant. Tell your doctor if you become pregnant.  If you are pregnant, your name may be listed on a pregnancy registry to track the effects of tirzepatide on the baby.  Tirzepatide can make birth control pills less effective. Ask your doctor about other birth control options such as an injection, implant, skin patch, vaginal ring, condom, diaphragm, cervical cap, or contraceptive  sponge. If you take birth control pills you may need to use different birth control options for 4 weeks after starting this medicine and for 4 weeks each time the dose is raised.  Ask a doctor if it is safe to breastfeed while using this medicine.  How should I use tirzepatide?  Follow all directions on your prescription label and read all medication guides or instruction sheets. Your doctor may occasionally change your dose. Use the medicine exactly as directed.  Tirzepatide is injected under the skin once per week. If you change your dosing day, allow at least 3 days (72 hours) to pass between doses.  You may use tirzepatide with or without food. Drink plenty of liquids while you are using this medicine.  Your healthcare provider will show you where to inject tirzepatide. Do not inject into the same place two times in a row.  You may have low blood sugar (hypoglycemia) and feel very hungry, dizzy, irritable, confused, anxious, or shaky. To quickly treat hypoglycemia, eat or drink a fast-acting source of sugar (fruit juice, hard candy, crackers, raisins, or non-diet soda).  You may give an injection of tirzepatide and insulin in the same area (such as upper arm), but not right next to each other.  Do not mix insulin and tirzepatide in the same injection.  Do not share this medicine with another person, even if they have the same symptoms you have.  Blood sugar levels can be affected by stress, illness, surgery, exercise, alcohol use, or skipping meals. Ask  your doctor before changing your dose or medication schedule.  Each injection pen or prefilled syringe is for one use only. Throw away after one use, even if there is still medicine left inside. Use a puncture-proof "sharps" container. Follow state or local laws about how to dispose of this container. Keep it out of the reach of children and pets.  Follow all storage instructions provided with tirzepatide. Your pharmacist can provide more information about how to  store this medicine.  What happens if I miss a dose?  Use the medicine as soon as you can, but skip the missed dose if you are more than 4 days (96 hours) late for the dose. Do not use two doses at one time.  What happens if I overdose?  Seek emergency medical attention or call the Poison Help line at [phone].  What should I avoid while using tirzepatide?  Never share an injection pen or prefilled syringe with another person, even if the needle has been changed. Sharing these devices can allow infections or disease to pass from one person to another.  What are the possible side effects of tirzepatide?  Stop using tirzepatide and get emergency medical help if you have:  signs of an allergic reaction: hives; difficult breathing; feeling light-headed; swelling of your face, lips, tongue, or throat; or  pancreatitis--severe pain in your upper stomach spreading to your back, nausea and vomiting.  Some people have thoughts about suicide while using tirzepatide. Tell your doctor right away if you have any sudden changes in mood or behavior, or thoughts about suicide.  Call your doctor at once if you have:  severe stomach problems;  changes in your vision;  signs of a thyroid tumor--swelling or a lump in your neck, trouble swallowing, a hoarse voice, or if you feel short of breath;  gallbladder problem--chalky-colored stools, stomach pain after eating, nausea, heartburn, bloating, and severe upper stomach pain that may spread to your back;  low blood sugar--headache, hunger, weakness, sweating, confusion, irritability, dizziness, fast heart rate, or feeling jittery; or  kidney problems--little or no urination, swelling in your feet or ankles, feeling tired or short of breath.  Common side effects may include:  hair loss;  tiredness;  signs of an allergic reaction;  bruising, swelling, warmth, redness, oozing, or bleeding where an injection was given;  nausea, diarrhea, decreased appetite, vomiting; or  heartburn,  burping, constipation, indigestion, or stomach pain.  This is not a complete list of side effects and others may occur. Call your doctor for medical advice about side effects. You may report side effects to FDA at 1-800-FDA-1088.  What other drugs will affect tirzepatide?  Tirzepatide can slow your digestion, and it may take longer for your body to absorb any medicines you take by mouth.  Tell your doctor about all your other medicines, especially:  insulin or oral diabetes medicine.  This list is not complete. Other drugs may affect tirzepatide, including prescription and over-the-counter medicines, vitamins, and herbal products. Not all possible drug interactions are listed here.  Where can I get more information?  Your doctor or pharmacist can provide more information about tirzepatide.

## 2024-03-21 ENCOUNTER — Encounter (INDEPENDENT_AMBULATORY_CARE_PROVIDER_SITE_OTHER): Payer: Self-pay

## 2024-03-21 LAB — ALLERGEN PROFILE WITH TOTAL IGE, RESPIRATORY, AREA 13  -LABCORP
D001-IgE D pteronyssinus: 8.38 kU/L — AB
D002-IgE D farinae: 11.4 kU/L — AB
E001-IgE Cat Dander: <0.1 kU/L
E005-IgE Dog Dander: 0.13 kU/L — AB
E072-IgE Mouse Urine: <0.1 kU/L
G002-IgE Bermuda Grass: <0.1 kU/L
G006-IgE Timothy Grass (Phleum pratense): <0.1 kU/L
G010-IgE Johnson Grass: <0.1 kU/L
I006-IgE Cockroach, German (Blatella germanica): <0.1 kU/L
Immunoglobulin E, Total: 61 [IU]/mL (ref 6–495)
M001-IgE Penicillium chrysogen: <0.1 kU/L
M002-IgE Cladosporium herbarum: <0.1 kU/L
M003-IgE Aspergillus fumigatus: <0.1 kU/L
M006-IgE Alternaria alternata: <0.1 kU/L
T002-IgE Alder, Grey (Alnus incana): <0.1 kU/L
T006-IgE Cedar, Mountain: <0.1 kU/L
T007-IgE Oak, White: <0.1 kU/L
T008-IgE Elm, American: <0.1 kU/L
T009-IgE Olive Tree: <0.1 kU/L
T010-IgE Walnut: <0.1 kU/L
T014-IgE Cottonwood (Populus deltoides): <0.1 kU/L
T070-IgE White Mulberry: <0.1 kU/L
W001-IgE Ragweed, Short: <0.1 kU/L
W006-IgE Mugwort (Artemisia vulgaris): <0.1 kU/L
W011-IgE Thistle, Russian: <0.1 kU/L
W014-IgE Pigweed, Rough: <0.1 kU/L

## 2024-03-21 LAB — IGE FOOD W/COMPONENT REFLEX II - LABCORP
Almond: <0.1 kU/L
Brazil Nut: <0.1 kU/L
Cashew Nut: <0.1 kU/L
F001-IgE Egg White: <0.1 kU/L
F002-IgE Milk: <0.1 kU/L
F003-IgE Codfish: <0.1 kU/L
F004-IgE Wheat: <0.1 kU/L
F008-IgE Corn: <0.1 kU/L
F010-IgE Sesame Seed: <0.1 kU/L
F013-IgE Peanut - LabCorp: <0.1 kU/L
F014-IgE Soybean: <0.1 kU/L
F024-IgE Shrimp: <0.1 kU/L
F203-IgE Pistachio Nut - LabCorp: <0.1 kU/L
F207-IgE Clam: <0.1 kU/L
F256-IgE Walnut: <0.1 kU/L
F338-IgE Scallop: <0.1 kU/L
F345-IgE Macadamia Nut - LabCorp: <0.1 kU/L
Hazelnut/Filbert: <0.1 kU/L
Pecan Nut: <0.1 kU/L

## 2024-03-28 ENCOUNTER — Ambulatory Visit (INDEPENDENT_AMBULATORY_CARE_PROVIDER_SITE_OTHER): Payer: Self-pay

## 2024-04-14 ENCOUNTER — Ambulatory Visit (INDEPENDENT_AMBULATORY_CARE_PROVIDER_SITE_OTHER)

## 2024-05-03 ENCOUNTER — Other Ambulatory Visit (INDEPENDENT_AMBULATORY_CARE_PROVIDER_SITE_OTHER): Payer: Self-pay | Admitting: Physician Assistant

## 2024-05-03 DIAGNOSIS — J45909 Unspecified asthma, uncomplicated: Secondary | ICD-10-CM

## 2024-05-05 DIAGNOSIS — M069 Rheumatoid arthritis, unspecified: Secondary | ICD-10-CM

## 2024-06-18 ENCOUNTER — Encounter (INDEPENDENT_AMBULATORY_CARE_PROVIDER_SITE_OTHER): Payer: Self-pay | Admitting: Medical

## 2024-06-18 ENCOUNTER — Telehealth (INDEPENDENT_AMBULATORY_CARE_PROVIDER_SITE_OTHER): Admitting: Medical

## 2024-06-18 VITALS — Ht 61.0 in | Wt 205.0 lb

## 2024-06-18 MED ORDER — NURTEC 75 MG PO TBDP: 75.0000 mg | ORAL_TABLET | Freq: Every day | ORAL | 2 refills | Status: AC | PRN

## 2024-06-18 MED ORDER — REYVOW 100 MG PO TABS: 100.0000 mg | ORAL_TABLET | ORAL | 3 refills | Status: AC | PRN

## 2024-06-18 MED ORDER — QULIPTA 60 MG PO TABS: 60.0000 mg | ORAL_TABLET | Freq: Every day | ORAL | 11 refills | Status: AC

## 2024-06-18 MED ORDER — ONDANSETRON HCL 4 MG OR TABS: 4.0000 mg | ORAL_TABLET | Freq: Three times a day (TID) | ORAL | 3 refills | Status: AC | PRN

## 2024-06-18 NOTE — Interdisciplinary (Signed)
 During today's appointment, I, Metta Manila, did the following:     Collected vital signs (BP, pulse, weight, and height) and disinfected the blood pressure cuff and vital station   Reviewed medications ensuring that the patient tells me the dosage and frequency. If the patient does not know, then the EMR should indicate "unknown" status  Reviewed allergies   Confirmed preferred pharmacy and laboratory   Confirmed if they use any alcohol or tobacco products   Confirmed if they had any falls in the past 6 months   Confirmed if they had any hospitalizations (ER, urgent care, or surgical operations) in the past 3 months   Confirmed patient signed their consent form(s) if they are having a procedure   Confirmed chief complaint for today's visit        If the patient had an EMG/NCS procedure, I ensured the heating pad(s) reached a target temperature of 38C to 42C (100.19F to 107.4F)     //TB

## 2024-06-18 NOTE — Progress Notes (Signed)
 Neurology Follow-Up    Date of visit: 06/18/2024     Patient: Kristen Olson  Date of birth: 12-27-1995  MRN: [id number]    History of Present Illness:  The patient is here for follow up regarding headaches. Last seen in April 2023.     Due to insurance issues, she switched to Dr. Marcellus for a while. Her headaches are about the same - daily with varying severity. Usually affect the vertex. Sometimes vision gets blurry and she feels dizzy. She has light and noise sensitivity. Occasional nausea. Continues to note brain fog.     Triggers include: stress, fatigue, poor sleep, and MSG    Current abortive medications: Nurtec (helped a little), Reyvow  (worked best)  Prior abortive medications: NSAIDs, opiates, Imitrex, Zomig, Ubrelvy , Nerve blocks - made HA worse    Current preventative medications: Nifedipine, Nebivolol, Lyrica, Venlafaxine   Prior preventative medications: Verapamil , nadolol , Ajovy , Vyepti , Prozac, Elavil, Topamax, and Neurontin     She does not sleep well. She was diagnosed with sleep apnea through Dr. Maya office. She tried to start CPAP but insurance apparently did not fully cover it and her copay was high.     Past Medical History:  Past Medical History:   Diagnosis Date    Depression     Migraine     Spinal lordosis     Tachycardia     Hx Tachycardia, coronary artery spasms - sees cardiologist, on meds. Defibrillator placed 09/2017.         Current Outpatient Medications:     albuterol  108 (90 Base) MCG/ACT inhaler, Inhale 1 puff by mouth every 6 hours as needed for Wheezing., Disp: 6.7 g, Rfl: 0    Atogepant  (QULIPTA ) 60 MG TABS, Take 60 mg by mouth daily., Disp: 30 tablet, Rfl: 11    buPROPion  (WELLBUTRIN  XL) 300 MG XL tablet, Take 1 tablet (300 mg) by mouth every morning., Disp: 90 tablet, Rfl: 0    Cholecalciferol (VITAMIN D3) 125 MCG (5000 UT) tablet, Take 1 tablet (5,000 Units) by mouth daily., Disp: 30 tablet, Rfl: 3    docusate sodium  (COLACE) 250 MG capsule, TAKE 1 CAPSULE (250  MG) BY MOUTH 2 TIMES DAILY, Disp: 60 capsule, Rfl: 2    fluticasone  propionate (FLONASE ) 50 MCG/ACT nasal spray, Spray 2 sprays into each nostril daily., Disp: 16 g, Rfl: 11    fluticasone -salmeterol (ADVAIR) 100-50 MCG/ACT inhaler, Inhale 1 puff by mouth every 12 hours. Rinse with water and spit after use., Disp: 60 each, Rfl: 11    lasmiditan  (REYVOW ) 100 MG tablet, Take 1 tablet (100 mg) by mouth as needed for Migraine., Disp: 8 tablet, Rfl: 3    Magnesium  Oxide 500 MG CAPS, TAKE 1 CAPSULE BY MOUTH EVERY DAY, Disp: 30 capsule, Rfl: 11    methotrexate (TREXALL) 2.5 MG tablet, Take 4 tablets (10 mg) by mouth every 7 days, Disp: , Rfl:     montelukast  (SINGULAIR ) 10 MG tablet, TAKE 1 TABLET BY MOUTH EVERY DAY IN THE EVENING, Disp: 90 tablet, Rfl: 0    Multiple Vitamin (ONE-A-DAY ESSENTIAL) TABS, Take 1 tablet by mouth daily., Disp: , Rfl:     Naltrexone  HCl, Pain, 4.5 MG CAPS, Take 1 Capful by mouth daily as needed (pain)., Disp: 30 capsule, Rfl: 0    nitroGLYcerin  (NITROSTAT ) 0.4 MG SL tablet, 1 tablet (0.4 mg) by Sublingual route every 5 minutes as needed for Chest Pain. up to 3 tabs per episode., Disp: 20 tablet, Rfl: 0    norethindrone  (ORTHO  MICRONOR ) 0.35 MG tablet, Take 1 tablet by mouth daily., Disp: 364 tablet, Rfl: 0    ondansetron  (ZOFRAN ) 4 MG tablet, Take 1 tablet (4 mg) by mouth every 8 hours as needed for Nausea., Disp: 30 tablet, Rfl: 3    pregabalin (LYRICA) 100 MG capsule, Take 1 capsule (100 mg) by mouth 3 times daily., Disp: , Rfl:     rimegepant (NURTEC) 75 MG tablet, Take 1 tablet (75 mg) on or under the tongue daily as needed (migraine)., Disp: 8 tablet, Rfl: 2    tirzepatide -weight management (ZEPBOUND ) 2.5 MG/0.5ML injection pen, Inject 0.5 mL (2.5 mg) under the skin every 7 days., Disp: 2 mL, Rfl: 0    tirzepatide -weight management (ZEPBOUND ) 5 MG/0.5ML injection pen, Inject 0.5 mL (5 mg) under the skin every 7 days., Disp: 2 mL, Rfl: 0    venlafaxine  XR (EFFEXOR  XR) 150 MG capsule, Take 1  capsule (150 mg) by mouth daily., Disp: 90 capsule, Rfl: 0    venlafaxine  XR (EFFEXOR  XR) 75 MG capsule, Take 1 capsule (75 mg) by mouth daily., Disp: 90 capsule, Rfl: 0    Allergies   Allergen Reactions    Gluten Meal Anaphylaxis    Molds & Smuts Cough, Itching, Runny Nose and Shortness of Breath     Asthma attacks       Review of Systems:  As outlined above in the HPI      Objective:  Ht 5' 1 (1.549 m)   Wt 93 kg (205 lb)   BMI 38.73 kg/m     General: lights are dim, she has her headset on for working from home, no acute distress  HEENT: normocephalic, atraumatic  Respiratory: normal respiratory effort  Skin: no rash noted  Neurologic:   Alert and oriented x 3.   Recent and remote memory intact  Speech is fluent, no dysarthria.   Comprehension is intact.   No ophthalmoplegia  Symmetric facial movements.   Hearing is grossly intact  Psychiatric: appropriate mood and affect, normal judgment and insight    Assessment/Plan:    ICD-10-CM ICD-9-CM   1. Chronic migraine without aura without status migrainosus, not intractable  G43.709 346.70   2. Insomnia, unspecified type  G47.00 780.52   3. Sleep apnea, unspecified type  G47.30 780.57   4. Nausea  R11.0 787.34         29 year old female with chronic migraine. She has tried Vyepti  and Ajvoy without success. She is fearful of Botox because she developed worse headache following a prior nerve block. Discussed trying Qulipta . For abortive treatment, I will have her continue Nurtec and Reyvow  but discussed that Reyvow  has been discontinued. Given history of coronary vasospasms requiring usage of nitrates, I recommend she avoid triptans. She states her cardiac symptoms were unchanged on Vyepti  or Ajovy . I do recommend sleep consult to address her insomnia and sleep apnea, which will aggravate her migraines and cognition.    - Start Qulipta  60 mg daily  - Nurtec or Reyvow  prn  - Zofran  prn (last EKG in 10/2023 at [facility] did not show QTc prolongation)  - Sleep medicine  referral  - Schedule follow up visit with Dr. Margene in 2-3 months      Signature:  Electronically signed by: Rollo Barren, PA-C  Signature Derived From Controlled Access Password, June 18, 2024    Time spent in review of records, documentation, evaluation of the patient, and discussion of the plan: 30 min    Medical Decision Making:  Number and/or complexity of problems addressed: moderate - 2 stable chronic conditions  Amount and/or complexity of data to review and analyze: minimal  Risk: moderate - rx mgmt  Overall: moderate      Patient Verification & Telemedicine Consent:    I am proceeding with this evaluation at the direct request of the patient.  I have verified this is the correct patient and have obtained verbal consent and written consent from the patient/ surrogate to perform this voluntary telemedicine evaluation (including obtaining history, performing examination and reviewing data provided by the patient).   The patient/surrogate has the right to refuse this evaluation.  I have explained risks (including potential loss of confidentiality), benefits, alternatives, and the potential need for subsequent face to face care. Patient/surrogate understands that there is a risk of medical inaccuracies given that our recommendations will be made based on reported data (and we must therefore assume this information is accurate).  Knowing that there is a risk that this information is not reported accurately, and that the telemedicine video, audio, or data feed may be incomplete, the patient agrees to proceed with evaluation and holds us  harmless knowing these risks. All laws concerning confidentiality and patient access to medical records and copies of medical records apply to telemedicine.  The patient/surrogate has received [facility] Notice of Privacy Practices.  I have reviewed this above verification and consent paragraph with the patient/surrogate.  If the patient is not capacitated to  understand the above, and no surrogate is available, since this is not an emergency evaluation, the visit will be rescheduled until such time that the patient can consent, or the surrogate is available to consent.    Demographics:  Medical Record #: [id number]   Date: June 18, 2024   Patient Name: Kristen Olson   DOB: 1996/05/17  Age: 29 year old  Sex: female  Location: Home address on file      Evaluator(s):   Kristen Olson was evaluated by me today.    Clinic Location:  [facility] [place]  [facility] [place]  [address], [address]  [place] [address]

## 2024-06-19 ENCOUNTER — Telehealth (INDEPENDENT_AMBULATORY_CARE_PROVIDER_SITE_OTHER): Payer: Self-pay | Admitting: Medical

## 2024-06-19 NOTE — Telephone Encounter (Signed)
 1/22 rcvd medical record request from [facility], sent most recent note to fax [phone].//tb

## 2024-06-30 ENCOUNTER — Telehealth (INDEPENDENT_AMBULATORY_CARE_PROVIDER_SITE_OTHER): Payer: Self-pay | Admitting: Medical

## 2024-07-02 NOTE — Telephone Encounter (Signed)
 Today's Date:07/02/24  Received PA response from: [employer] [facility]  For Medication:Qulipta  60MG  tablets   The PA has been APPROVED  PA has been archived on [email or web address]  Auth number (if available): N/A  PA start date:06/30/2024  Expires on:06/30/2025  Letter sent to scan: PENDING LETTER  Initials:BV

## 2024-07-16 ENCOUNTER — Ambulatory Visit
